# Patient Record
Sex: Female | Born: 1940 | Race: Black or African American | Hispanic: No | Smoking: Former smoker
Health system: Southern US, Community
[De-identification: ages and names within clinical notes are randomized; demographics above are authoritative.]

## PROBLEM LIST (undated history)

## (undated) ENCOUNTER — Emergency Department (HOSPITAL_COMMUNITY): Disposition: A | Discharge status: Other Acute Inpatient Hospital | Payer: Medicare Other

## (undated) DIAGNOSIS — I10 Essential (primary) hypertension: Secondary | ICD-10-CM

## (undated) DIAGNOSIS — E049 Nontoxic goiter, unspecified: Secondary | ICD-10-CM

## (undated) DIAGNOSIS — M797 Fibromyalgia: Secondary | ICD-10-CM

## (undated) DIAGNOSIS — G629 Polyneuropathy, unspecified: Secondary | ICD-10-CM

## (undated) DIAGNOSIS — H269 Unspecified cataract: Secondary | ICD-10-CM

## (undated) DIAGNOSIS — F17200 Nicotine dependence, unspecified, uncomplicated: Secondary | ICD-10-CM

## (undated) DIAGNOSIS — E785 Hyperlipidemia, unspecified: Secondary | ICD-10-CM

## (undated) HISTORY — PX: EYE SURGERY: SHX253

## (undated) HISTORY — DX: Hyperlipidemia, unspecified: E78.5

## (undated) HISTORY — PX: ABDOMINAL HYSTERECTOMY: SHX81

## (undated) HISTORY — DX: Unspecified cataract: H26.9

## (undated) HISTORY — DX: Nicotine dependence, unspecified, uncomplicated: F17.200

---

## 1998-05-18 ENCOUNTER — Encounter: Payer: Self-pay | Admitting: Family Medicine

## 1998-05-18 ENCOUNTER — Ambulatory Visit (HOSPITAL_COMMUNITY): Admission: RE | Admit: 1998-05-18 | Discharge: 1998-05-18 | Payer: Self-pay | Admitting: Family Medicine

## 1998-09-06 ENCOUNTER — Encounter: Payer: Self-pay | Admitting: Family Medicine

## 1998-09-06 ENCOUNTER — Ambulatory Visit (HOSPITAL_COMMUNITY): Admission: RE | Admit: 1998-09-06 | Discharge: 1998-09-06 | Payer: Self-pay | Admitting: Family Medicine

## 1999-05-07 ENCOUNTER — Encounter: Payer: Self-pay | Admitting: Family Medicine

## 1999-05-07 ENCOUNTER — Ambulatory Visit (HOSPITAL_COMMUNITY): Admission: RE | Admit: 1999-05-07 | Discharge: 1999-05-07 | Payer: Self-pay | Admitting: Family Medicine

## 1999-07-17 ENCOUNTER — Ambulatory Visit (HOSPITAL_COMMUNITY): Admission: RE | Admit: 1999-07-17 | Discharge: 1999-07-17 | Payer: Self-pay | Admitting: Family Medicine

## 1999-07-17 ENCOUNTER — Encounter: Payer: Self-pay | Admitting: Family Medicine

## 1999-09-14 ENCOUNTER — Encounter: Payer: Self-pay | Admitting: Specialist

## 1999-09-14 ENCOUNTER — Encounter: Admission: RE | Admit: 1999-09-14 | Discharge: 1999-09-14 | Payer: Self-pay | Admitting: Specialist

## 2000-04-08 ENCOUNTER — Emergency Department (HOSPITAL_COMMUNITY): Admission: EM | Admit: 2000-04-08 | Discharge: 2000-04-08 | Payer: Self-pay | Admitting: Emergency Medicine

## 2000-04-08 ENCOUNTER — Encounter: Payer: Self-pay | Admitting: Emergency Medicine

## 2000-08-16 ENCOUNTER — Ambulatory Visit (HOSPITAL_COMMUNITY): Admission: RE | Admit: 2000-08-16 | Discharge: 2000-08-16 | Payer: Self-pay | Admitting: Family Medicine

## 2000-08-16 ENCOUNTER — Encounter: Payer: Self-pay | Admitting: Family Medicine

## 2000-10-13 ENCOUNTER — Encounter: Payer: Self-pay | Admitting: Family Medicine

## 2000-10-13 ENCOUNTER — Ambulatory Visit (HOSPITAL_COMMUNITY): Admission: RE | Admit: 2000-10-13 | Discharge: 2000-10-13 | Payer: Self-pay | Admitting: Family Medicine

## 2000-12-25 ENCOUNTER — Encounter: Admission: RE | Admit: 2000-12-25 | Discharge: 2000-12-25 | Payer: Self-pay | Admitting: Family Medicine

## 2000-12-25 ENCOUNTER — Encounter: Payer: Self-pay | Admitting: Family Medicine

## 2001-03-12 ENCOUNTER — Encounter: Admission: RE | Admit: 2001-03-12 | Discharge: 2001-03-12 | Payer: Self-pay | Admitting: Family Medicine

## 2001-03-13 ENCOUNTER — Encounter: Admission: RE | Admit: 2001-03-13 | Discharge: 2001-03-13 | Payer: Self-pay | Admitting: Family Medicine

## 2001-03-13 ENCOUNTER — Encounter: Payer: Self-pay | Admitting: Family Medicine

## 2001-11-28 ENCOUNTER — Encounter: Payer: Self-pay | Admitting: Emergency Medicine

## 2001-11-28 ENCOUNTER — Emergency Department (HOSPITAL_COMMUNITY): Admission: EM | Admit: 2001-11-28 | Discharge: 2001-11-28 | Payer: Self-pay | Admitting: Emergency Medicine

## 2003-06-17 ENCOUNTER — Emergency Department (HOSPITAL_COMMUNITY): Admission: EM | Admit: 2003-06-17 | Discharge: 2003-06-17 | Payer: Self-pay | Admitting: Emergency Medicine

## 2005-04-26 ENCOUNTER — Ambulatory Visit (HOSPITAL_COMMUNITY): Admission: RE | Admit: 2005-04-26 | Discharge: 2005-04-26 | Payer: Self-pay | Admitting: Gastroenterology

## 2006-04-21 ENCOUNTER — Emergency Department (HOSPITAL_COMMUNITY): Admission: EM | Admit: 2006-04-21 | Discharge: 2006-04-21 | Payer: Self-pay | Admitting: Emergency Medicine

## 2006-06-05 ENCOUNTER — Encounter: Admission: RE | Admit: 2006-06-05 | Discharge: 2006-06-05 | Payer: Self-pay | Admitting: Orthopedic Surgery

## 2008-10-20 ENCOUNTER — Emergency Department (HOSPITAL_COMMUNITY): Admission: EM | Admit: 2008-10-20 | Discharge: 2008-10-20 | Payer: Self-pay | Admitting: Emergency Medicine

## 2008-11-09 ENCOUNTER — Encounter: Admission: RE | Admit: 2008-11-09 | Discharge: 2008-11-09 | Payer: Self-pay | Admitting: Family Medicine

## 2008-12-04 ENCOUNTER — Emergency Department (HOSPITAL_COMMUNITY): Admission: EM | Admit: 2008-12-04 | Discharge: 2008-12-04 | Payer: Self-pay | Admitting: Emergency Medicine

## 2008-12-11 ENCOUNTER — Emergency Department (HOSPITAL_COMMUNITY): Admission: EM | Admit: 2008-12-11 | Discharge: 2008-12-12 | Payer: Self-pay | Admitting: Emergency Medicine

## 2009-03-13 ENCOUNTER — Emergency Department (HOSPITAL_COMMUNITY): Admission: EM | Admit: 2009-03-13 | Discharge: 2009-03-13 | Payer: Self-pay | Admitting: Emergency Medicine

## 2009-03-15 ENCOUNTER — Emergency Department (HOSPITAL_COMMUNITY): Admission: EM | Admit: 2009-03-15 | Discharge: 2009-03-15 | Payer: Self-pay | Admitting: Emergency Medicine

## 2009-05-27 ENCOUNTER — Emergency Department (HOSPITAL_COMMUNITY): Admission: EM | Admit: 2009-05-27 | Discharge: 2009-05-27 | Payer: Self-pay | Admitting: Emergency Medicine

## 2009-09-12 ENCOUNTER — Emergency Department (HOSPITAL_COMMUNITY): Admission: EM | Admit: 2009-09-12 | Discharge: 2009-09-12 | Payer: Self-pay | Admitting: Emergency Medicine

## 2009-12-26 ENCOUNTER — Encounter: Admission: RE | Admit: 2009-12-26 | Discharge: 2010-01-11 | Payer: Self-pay | Admitting: Neurology

## 2010-03-01 ENCOUNTER — Emergency Department (HOSPITAL_COMMUNITY): Admission: EM | Admit: 2010-03-01 | Discharge: 2010-03-01 | Payer: Self-pay | Admitting: Emergency Medicine

## 2010-06-29 ENCOUNTER — Emergency Department (HOSPITAL_COMMUNITY)
Admission: EM | Admit: 2010-06-29 | Discharge: 2010-06-29 | Payer: Self-pay | Source: Home / Self Care | Admitting: Emergency Medicine

## 2010-09-10 LAB — URINE MICROSCOPIC-ADD ON

## 2010-09-10 LAB — POCT I-STAT, CHEM 8
BUN: 10 mg/dL (ref 6–23)
Chloride: 100 mEq/L (ref 96–112)
TCO2: 29 mmol/L (ref 0–100)

## 2010-09-10 LAB — URINALYSIS, ROUTINE W REFLEX MICROSCOPIC
Glucose, UA: NEGATIVE mg/dL
Hgb urine dipstick: NEGATIVE
Protein, ur: NEGATIVE mg/dL
pH: 6 (ref 5.0–8.0)

## 2010-09-13 LAB — GLUCOSE, CAPILLARY: Glucose-Capillary: 246 mg/dL — ABNORMAL HIGH (ref 70–99)

## 2010-09-24 LAB — CBC
MCV: 88.6 fL (ref 78.0–100.0)
WBC: 7.5 10*3/uL (ref 4.0–10.5)

## 2010-09-24 LAB — COMPREHENSIVE METABOLIC PANEL
ALT: 20 U/L (ref 0–35)
AST: 17 U/L (ref 0–37)
Alkaline Phosphatase: 115 U/L (ref 39–117)
CO2: 30 mEq/L (ref 19–32)
Chloride: 100 mEq/L (ref 96–112)
GFR calc Af Amer: >60 mL/min (ref >60)
Sodium: 140 mEq/L (ref 135–145)
Total Bilirubin: 0.4 mg/dL (ref 0.3–1.2)

## 2010-09-24 LAB — URINALYSIS, ROUTINE W REFLEX MICROSCOPIC
Bilirubin Urine: NEGATIVE
Glucose, UA: >1000 mg/dL — AB
Ketones, ur: NEGATIVE mg/dL
Leukocytes, UA: NEGATIVE
pH: 5 (ref 5.0–8.0)

## 2010-09-24 LAB — URINE CULTURE: Culture: NO GROWTH

## 2010-09-24 LAB — DIFFERENTIAL
Basophils Absolute: 0 10*3/uL (ref 0.0–0.1)
Eosinophils Absolute: 0.1 10*3/uL (ref 0.0–0.7)
Eosinophils Relative: 1 % (ref 0–5)
Neutrophils Relative %: 52 % (ref 43–77)

## 2010-09-24 LAB — URINE MICROSCOPIC-ADD ON

## 2010-10-03 LAB — URINALYSIS, ROUTINE W REFLEX MICROSCOPIC
Bilirubin Urine: NEGATIVE
Glucose, UA: >1000 mg/dL — AB
Hgb urine dipstick: NEGATIVE
Ketones, ur: NEGATIVE mg/dL
Leukocytes, UA: NEGATIVE
Nitrite: NEGATIVE
Protein, ur: NEGATIVE mg/dL
Specific Gravity, Urine: 1.036 — ABNORMAL HIGH (ref 1.005–1.030)
Urobilinogen, UA: 0.2 mg/dL (ref 0.0–1.0)
pH: 5 (ref 5.0–8.0)

## 2010-10-03 LAB — CBC
HCT: 41.1 % (ref 36.0–46.0)
Hemoglobin: 13.7 g/dL (ref 12.0–15.0)
MCHC: 33.4 g/dL (ref 30.0–36.0)
MCV: 88.7 fL (ref 78.0–100.0)
Platelets: 268 K/uL (ref 150–400)
RBC: 4.64 MIL/uL (ref 3.87–5.11)
RDW: 14.1 % (ref 11.5–15.5)
WBC: 7.7 10*3/uL (ref 4.0–10.5)

## 2010-10-03 LAB — DIFFERENTIAL
Basophils Absolute: 0 K/uL (ref 0.0–0.1)
Basophils Relative: 0 % (ref 0–1)
Eosinophils Absolute: 0.2 K/uL (ref 0.0–0.7)
Eosinophils Relative: 2 % (ref 0–5)
Lymphocytes Relative: 42 % (ref 12–46)
Lymphs Abs: 3.3 10*3/uL (ref 0.7–4.0)
Monocytes Absolute: 0.4 K/uL (ref 0.1–1.0)
Monocytes Relative: 5 % (ref 3–12)
Neutro Abs: 3.9 10*3/uL (ref 1.7–7.7)
Neutrophils Relative %: 50 % (ref 43–77)

## 2010-10-03 LAB — URINE MICROSCOPIC-ADD ON

## 2010-10-03 LAB — GLUCOSE, CAPILLARY
Glucose-Capillary: 256 mg/dL — ABNORMAL HIGH (ref 70–99)
Glucose-Capillary: 271 mg/dL — ABNORMAL HIGH (ref 70–99)

## 2010-10-03 LAB — COMPREHENSIVE METABOLIC PANEL
ALT: 25 U/L (ref 0–35)
Albumin: 4 g/dL (ref 3.5–5.2)
BUN: 18 mg/dL (ref 6–23)
Chloride: 95 mEq/L — ABNORMAL LOW (ref 96–112)
Creatinine, Ser: 0.98 mg/dL (ref 0.4–1.2)
GFR calc Af Amer: >60 mL/min (ref >60)
GFR calc non Af Amer: 56 mL/min — ABNORMAL LOW (ref >60)
Glucose, Bld: 401 mg/dL — ABNORMAL HIGH (ref 70–99)
Sodium: 132 mEq/L — ABNORMAL LOW (ref 135–145)

## 2010-10-03 LAB — COMPREHENSIVE METABOLIC PANEL WITH GFR
AST: 22 U/L (ref 0–37)
Alkaline Phosphatase: 127 U/L — ABNORMAL HIGH (ref 39–117)
CO2: 28 meq/L (ref 19–32)
Calcium: 9.4 mg/dL (ref 8.4–10.5)
Potassium: 3.4 meq/L — ABNORMAL LOW (ref 3.5–5.1)
Total Bilirubin: 0.6 mg/dL (ref 0.3–1.2)
Total Protein: 8.4 g/dL — ABNORMAL HIGH (ref 6.0–8.3)

## 2010-10-05 LAB — URINALYSIS, ROUTINE W REFLEX MICROSCOPIC
Bilirubin Urine: NEGATIVE
Glucose, UA: >1000 mg/dL — AB
Ketones, ur: NEGATIVE mg/dL
Leukocytes, UA: NEGATIVE
Nitrite: NEGATIVE
Protein, ur: NEGATIVE mg/dL
Specific Gravity, Urine: 1.03 (ref 1.005–1.030)
Specific Gravity, Urine: 1.031 — ABNORMAL HIGH (ref 1.005–1.030)
Urobilinogen, UA: 0.2 mg/dL (ref 0.0–1.0)
pH: 5 (ref 5.0–8.0)

## 2010-10-05 LAB — DIFFERENTIAL
Basophils Absolute: 0 10*3/uL (ref 0.0–0.1)
Basophils Absolute: 0.1 10*3/uL (ref 0.0–0.1)
Basophils Relative: 1 % (ref 0–1)
Eosinophils Absolute: 0.1 10*3/uL (ref 0.0–0.7)
Eosinophils Relative: 2 % (ref 0–5)
Eosinophils Relative: 3 % (ref 0–5)
Lymphocytes Relative: 25 % (ref 12–46)
Lymphs Abs: 3.1 10*3/uL (ref 0.7–4.0)
Monocytes Absolute: 0.3 10*3/uL (ref 0.1–1.0)

## 2010-10-05 LAB — CBC
HCT: 38.8 % (ref 36.0–46.0)
HCT: 39.3 % (ref 36.0–46.0)
Hemoglobin: 13.1 g/dL (ref 12.0–15.0)
MCV: 88.7 fL (ref 78.0–100.0)
Platelets: 258 10*3/uL (ref 150–400)
Platelets: 270 10*3/uL (ref 150–400)
RDW: 15.1 % (ref 11.5–15.5)
RDW: 15.1 % (ref 11.5–15.5)

## 2010-10-05 LAB — POCT I-STAT, CHEM 8
BUN: 16 mg/dL (ref 6–23)
BUN: 18 mg/dL (ref 6–23)
Chloride: 102 mEq/L (ref 96–112)
Glucose, Bld: 311 mg/dL — ABNORMAL HIGH (ref 70–99)
HCT: 42 % (ref 36.0–46.0)
HCT: 44 % (ref 36.0–46.0)
Hemoglobin: 14.3 g/dL (ref 12.0–15.0)
Potassium: 3.7 mEq/L (ref 3.5–5.1)
Sodium: 137 mEq/L (ref 135–145)
TCO2: 29 mmol/L (ref 0–100)

## 2010-10-05 LAB — GLUCOSE, CAPILLARY
Glucose-Capillary: 199 mg/dL — ABNORMAL HIGH (ref 70–99)
Glucose-Capillary: 272 mg/dL — ABNORMAL HIGH (ref 70–99)
Glucose-Capillary: 352 mg/dL — ABNORMAL HIGH (ref 70–99)
Glucose-Capillary: 427 mg/dL — ABNORMAL HIGH (ref 70–99)

## 2010-10-05 LAB — URINE MICROSCOPIC-ADD ON

## 2010-10-05 LAB — KETONES, QUALITATIVE: Acetone, Bld: NEGATIVE

## 2010-10-08 LAB — ETHANOL: Alcohol, Ethyl (B): <5 mg/dL (ref 0–10)

## 2010-10-08 LAB — BASIC METABOLIC PANEL
BUN: 17 mg/dL (ref 6–23)
Creatinine, Ser: 0.81 mg/dL (ref 0.4–1.2)
GFR calc non Af Amer: >60 mL/min (ref >60)

## 2010-10-08 LAB — URINALYSIS, ROUTINE W REFLEX MICROSCOPIC
Protein, ur: 30 mg/dL — AB
Urobilinogen, UA: 1 mg/dL (ref 0.0–1.0)

## 2010-10-08 LAB — URINE MICROSCOPIC-ADD ON

## 2010-10-08 LAB — POCT CARDIAC MARKERS: Myoglobin, poc: 68.8 ng/mL (ref 12–200)

## 2010-10-08 LAB — CBC
MCV: 86.8 fL (ref 78.0–100.0)
Platelets: 304 10*3/uL (ref 150–400)
WBC: 7.6 10*3/uL (ref 4.0–10.5)

## 2010-10-08 LAB — RAPID URINE DRUG SCREEN, HOSP PERFORMED
Amphetamines: NOT DETECTED
Benzodiazepines: NOT DETECTED
Cocaine: NOT DETECTED
Tetrahydrocannabinol: NOT DETECTED

## 2010-10-08 LAB — MAGNESIUM: Magnesium: 2 mg/dL (ref 1.5–2.5)

## 2010-10-10 LAB — URINALYSIS, ROUTINE W REFLEX MICROSCOPIC
Hgb urine dipstick: NEGATIVE
Ketones, ur: 15 mg/dL — AB
Leukocytes, UA: NEGATIVE
Protein, ur: 30 mg/dL — AB
Urobilinogen, UA: 1 mg/dL (ref 0.0–1.0)

## 2010-10-10 LAB — GLUCOSE, CAPILLARY
Glucose-Capillary: 257 mg/dL — ABNORMAL HIGH (ref 70–99)
Glucose-Capillary: 359 mg/dL — ABNORMAL HIGH (ref 70–99)

## 2010-10-10 LAB — POCT I-STAT, CHEM 8
BUN: 12 mg/dL (ref 6–23)
Calcium, Ion: 1.12 mmol/L (ref 1.12–1.32)
Chloride: 101 mEq/L (ref 96–112)
Potassium: 3.8 mEq/L (ref 3.5–5.1)

## 2010-10-10 LAB — POCT CARDIAC MARKERS
CKMB, poc: 1.4 ng/mL (ref 1.0–8.0)
Myoglobin, poc: 222 ng/mL (ref 12–200)
Troponin i, poc: <0.05 ng/mL (ref 0.00–0.09)

## 2010-10-10 LAB — URINE MICROSCOPIC-ADD ON

## 2011-11-14 ENCOUNTER — Encounter (HOSPITAL_COMMUNITY): Payer: Self-pay

## 2011-11-14 ENCOUNTER — Emergency Department (HOSPITAL_COMMUNITY)
Admission: EM | Admit: 2011-11-14 | Discharge: 2011-11-14 | Disposition: A | Discharge status: Home / Self Care | Payer: Medicare Other | Attending: Emergency Medicine | Admitting: Emergency Medicine

## 2011-11-14 DIAGNOSIS — R350 Frequency of micturition: Secondary | ICD-10-CM | POA: Insufficient documentation

## 2011-11-14 DIAGNOSIS — E119 Type 2 diabetes mellitus without complications: Secondary | ICD-10-CM | POA: Insufficient documentation

## 2011-11-14 DIAGNOSIS — F17200 Nicotine dependence, unspecified, uncomplicated: Secondary | ICD-10-CM | POA: Insufficient documentation

## 2011-11-14 DIAGNOSIS — Z91199 Patient's noncompliance with other medical treatment and regimen due to unspecified reason: Secondary | ICD-10-CM | POA: Insufficient documentation

## 2011-11-14 DIAGNOSIS — Z9119 Patient's noncompliance with other medical treatment and regimen: Secondary | ICD-10-CM | POA: Insufficient documentation

## 2011-11-14 DIAGNOSIS — R112 Nausea with vomiting, unspecified: Secondary | ICD-10-CM | POA: Insufficient documentation

## 2011-11-14 DIAGNOSIS — Z794 Long term (current) use of insulin: Secondary | ICD-10-CM | POA: Insufficient documentation

## 2011-11-14 DIAGNOSIS — I10 Essential (primary) hypertension: Secondary | ICD-10-CM | POA: Insufficient documentation

## 2011-11-14 DIAGNOSIS — R739 Hyperglycemia, unspecified: Secondary | ICD-10-CM

## 2011-11-14 HISTORY — DX: Essential (primary) hypertension: I10

## 2011-11-14 HISTORY — DX: Polyneuropathy, unspecified: G62.9

## 2011-11-14 LAB — DIFFERENTIAL
Basophils Absolute: 0 10*3/uL (ref 0.0–0.1)
Basophils Relative: 0 % (ref 0–1)
Eosinophils Absolute: 0.1 10*3/uL (ref 0.0–0.7)
Eosinophils Relative: 1 % (ref 0–5)
Monocytes Absolute: 0.4 10*3/uL (ref 0.1–1.0)

## 2011-11-14 LAB — COMPREHENSIVE METABOLIC PANEL
ALT: 13 U/L (ref 0–35)
AST: 13 U/L (ref 0–37)
Calcium: 9.3 mg/dL (ref 8.4–10.5)
Creatinine, Ser: 0.5 mg/dL (ref 0.50–1.10)
GFR calc non Af Amer: >90 mL/min (ref >90)
Sodium: 137 mEq/L (ref 135–145)
Total Protein: 8 g/dL (ref 6.0–8.3)

## 2011-11-14 LAB — CBC
HCT: 40 % (ref 36.0–46.0)
MCH: 28.2 pg (ref 26.0–34.0)
MCHC: 33.3 g/dL (ref 30.0–36.0)
MCV: 84.9 fL (ref 78.0–100.0)
Platelets: 274 10*3/uL (ref 150–400)
RDW: 14.4 % (ref 11.5–15.5)
WBC: 7.8 10*3/uL (ref 4.0–10.5)

## 2011-11-14 LAB — GLUCOSE, CAPILLARY
Glucose-Capillary: 391 mg/dL — ABNORMAL HIGH (ref 70–99)
Glucose-Capillary: 346 mg/dL — ABNORMAL HIGH (ref 70–99)
Glucose-Capillary: 240 mg/dL — ABNORMAL HIGH (ref 70–99)

## 2011-11-14 LAB — URINALYSIS, ROUTINE W REFLEX MICROSCOPIC
Bilirubin Urine: NEGATIVE
Hgb urine dipstick: NEGATIVE
Nitrite: NEGATIVE
Specific Gravity, Urine: 1.034 — ABNORMAL HIGH (ref 1.005–1.030)
Urobilinogen, UA: 1 mg/dL (ref 0.0–1.0)
pH: 6 (ref 5.0–8.0)

## 2011-11-14 LAB — URINE MICROSCOPIC-ADD ON

## 2011-11-14 MED ORDER — INSULIN ASPART 100 UNIT/ML ~~LOC~~ SOLN
10.0000 [IU] | Freq: Once | SUBCUTANEOUS | Status: AC
  Administered 2011-11-14: 10 [IU] via SUBCUTANEOUS
  Filled 2011-11-14: qty 1

## 2011-11-14 MED ORDER — ONDANSETRON HCL 8 MG PO TABS: 8.0000 mg | ORAL_TABLET | Freq: Three times a day (TID) | ORAL | Status: AC | PRN

## 2011-11-14 MED ORDER — ONDANSETRON HCL 4 MG/2ML IJ SOLN
4.0000 mg | Freq: Once | INTRAMUSCULAR | Status: AC
  Administered 2011-11-14: 4 mg via INTRAVENOUS
  Filled 2011-11-14: qty 2

## 2011-11-14 MED ORDER — SODIUM CHLORIDE 0.9 % IV BOLUS (SEPSIS)
1000.0000 mL | Freq: Once | INTRAVENOUS | Status: AC
  Administered 2011-11-14: 1000 mL via INTRAVENOUS

## 2011-11-14 NOTE — ED Provider Notes (Signed)
History     CSN: [id number]  Arrival date & time 11/14/11  0847   First MD Initiated Contact with Patient 11/14/11 [phone]      Chief Complaint  Patient presents with  . Emesis    (Consider location/radiation/quality/duration/timing/severity/associated sxs/prior treatment) HPI History provided by pt.   Pt has had nausea with 2 episodes of vomiting this morning.  Nausea currently improved.  Denies fever, CP/SOB, abd pain, diarrhea and hematemesis/hematochezia/melena.  Has had increased urinary frequency but otherwise no urinary sx. Has missed at least the last 2 doses of her lantus because she has been out of the house in the evening.  Checks her BG nearly every day and it has been "running high for awhile".   Also c/o walking sideways and feeling like she's going to fall for the past week.  Denies dizziness, vision changes, dysarthria, dysphagia, worse than baseline paresthesias (attributed to chronic neuropathy).    Past Medical History  Diagnosis Date  . Diabetes mellitus   . Hypertension   . Neuropathy     Past Surgical History  Procedure Date  . Eye surgery     No family history on file.  History  Substance Use Topics  . Smoking status: Current Everyday Smoker -- 0.5 packs/day  . Smokeless tobacco: Not on file  . Alcohol Use: No    OB History    Grav Para Term Preterm Abortions TAB SAB Ect Mult Living                  Review of Systems  All other systems reviewed and are negative.    Allergies  Review of patient's allergies indicates no known allergies.  Home Medications   Current Outpatient Rx  Name Route Sig Dispense Refill  . AMLODIPINE BESYLATE 10 MG PO TABS Oral Take 10 mg by mouth daily.    . INSULIN GLARGINE 100 UNIT/ML [place] SOLN Subcutaneous Inject 30 Units into the skin at bedtime.    Marland Kitchen METFORMIN HCL 500 MG PO TABS Oral Take 1,000 mg by mouth 2 (two) times daily with a meal.    . ROSUVASTATIN CALCIUM 5 MG PO TABS Oral Take 5 mg by mouth daily.    Marland Kitchen  VALSARTAN-HYDROCHLOROTHIAZIDE 160-25 MG PO TABS Oral Take 1 tablet by mouth daily.      BP 123/62  Pulse 67  Temp(Src) 98.1 F (36.7 C) (Oral)  Resp 15  Ht 5\' 6"  (1.676 m)  Wt 150 lb (68.04 kg)  BMI 24.21 kg/m2  SpO2 96%  Physical Exam  Nursing note and vitals reviewed. Constitutional: She is oriented to person, place, and time. She appears well-developed and well-nourished. No distress.  HENT:  Head: Normocephalic and atraumatic.  Eyes:       Normal appearance  Neck: Normal range of motion.  Cardiovascular: Normal rate, regular rhythm and intact distal pulses.   Pulmonary/Chest: Effort normal and breath sounds normal.  Abdominal: Soft. Bowel sounds are normal. She exhibits no distension. There is no tenderness.  Genitourinary:       No CVA ttp  Musculoskeletal: Normal range of motion.  Neurological: She is alert and oriented to person, place, and time. She displays no tremor. No sensory deficit. She displays a negative Romberg sign. Coordination and gait normal.       CN 3-12 intact.  5/5 and equal upper and lower extremity strength.  No past pointing.  No pronator drift.  No nystagmus.   Skin: Skin is warm and dry. No rash noted.  Psychiatric: She has a normal mood and affect. Her behavior is normal.    ED Course  Procedures (including critical care time)  Labs Reviewed  COMPREHENSIVE METABOLIC PANEL - Abnormal; Notable for the following:    Glucose, Bld 419 (*)    Alkaline Phosphatase 153 (*)    All other components within normal limits  URINALYSIS, ROUTINE W REFLEX MICROSCOPIC - Abnormal; Notable for the following:    Specific Gravity, Urine 1.034 (*)    Glucose, UA >1000 (*)    Ketones, ur 15 (*)    All other components within normal limits  GLUCOSE, CAPILLARY - Abnormal; Notable for the following:    Glucose-Capillary 391 (*)    All other components within normal limits  GLUCOSE, CAPILLARY - Abnormal; Notable for the following:    Glucose-Capillary 346 (*)     All other components within normal limits  GLUCOSE, CAPILLARY - Abnormal; Notable for the following:    Glucose-Capillary 240 (*)    All other components within normal limits  CBC  DIFFERENTIAL  URINE MICROSCOPIC-ADD ON   No results found.   1. Hyperglycemia   2. Nausea and vomiting       MDM  71yo F presents w/ N/V since this morning.  Associated w/ increased urinary frequency.  Non-compliant w/ insulin and cbg 391 this morning.  On exam, afebrile, abd benign and non-tender, no CVA ttp.  Also c/o walking sideways and feeling as though she's going to fall x 1 week.  No focal neuro deficits (nml gait and neg romberg) today and pt asymptomatic w/ ambulation.  Suspect that all sx are secondary to hyperglycemia.  Basic labs pending.  Pt receiving IV NS and zofran.  9:43 AM   After 2L bolus and 10units subq novolog, cbg has improved from 419 on BMP to 240.  Pt is not acidotic.  Her nausea has improved and she is tolerating pos.  D/c'd home w/ zofran.  Advised her to be compliant w/ her diabetes medication and f/u with Dr. Parke Simmers asap.  Return precautions discussed.         Otilio Miu, [place] 11/14/11 [phone]

## 2011-11-14 NOTE — ED Notes (Signed)
Pt presents with onset of vomiting today.  Pt reports abdominal pain that "feels like I have to go to the bathroom", has not had a bowel movement since yesterday. Pt reports over the past week, she has had tingling in both of her feet with difficulty walking due to weakness to L leg.  Pt reports weight loss of 20 pounds x 1 months.

## 2011-11-14 NOTE — Discharge Instructions (Signed)
Take zofran as needed for nausea.   Continue your diabetes medications as prescribed.  Follow up with your primary care doctor for blood sugar recheck next week.  You should return to the ER if you develop severe abdominal pain, uncontrolled vomiting or increased difficulty with walking.

## 2011-11-14 NOTE — ED Notes (Signed)
Pt undressed, in gown, on monitor, continuous pulse oximetry and blood pressure cuff 

## 2011-11-14 NOTE — ED Notes (Signed)
CBG: 391 

## 2011-11-14 NOTE — ED Notes (Signed)
Patient verbalized understanding drinking ice water slowly for oral challenge.

## 2011-11-14 NOTE — ED Notes (Signed)
Patient resting comfortable on stretcher given a warm blanket to patient denies nausea at this time.

## 2011-11-14 NOTE — ED Notes (Signed)
Patient ambulated with PA steady gait.

## 2011-11-14 NOTE — ED Notes (Signed)
Patient states have not been taking her insulin for 2-3 days. Has nausea and vomiting denies any abdominal pain, chest pain, or shortness of breath.  States have not been eating well for a while due to teeth falling out.  States history of neuropathy and tingling in lower extremities and for the past week "walking sideways" Ax4 answering and following commands appropriate.  Airway intact bilateral equal chest rise and fall.

## 2011-11-14 NOTE — ED Provider Notes (Signed)
Medical screening examination/treatment/procedure(s) were conducted as a shared visit with non-physician practitioner(s) and myself.  I personally evaluated the patient during the encounter  She complains of elevated glucose for 2 days. States she's taking her medicine, but has not eaten this morning. She denies abdominal pain. The abdomen is soft and nontender. At 10:38.  Evaluate for hypoglycemia, infection, and monitor hemodynamics state.    Flint Melter, MD 11/15/11 2027

## 2011-11-15 NOTE — ED Provider Notes (Deleted)
Medical screening examination/treatment/procedure(s) were performed by non-physician practitioner and as supervising physician I was immediately available for consultation/collaboration.  Flint Melter, MD 11/15/11 1315

## 2012-04-03 ENCOUNTER — Emergency Department (HOSPITAL_COMMUNITY): Payer: Medicare Other

## 2012-04-03 ENCOUNTER — Encounter (HOSPITAL_COMMUNITY): Payer: Self-pay | Admitting: *Deleted

## 2012-04-03 ENCOUNTER — Emergency Department (HOSPITAL_COMMUNITY)
Admission: EM | Admit: 2012-04-03 | Discharge: 2012-04-03 | Disposition: A | Discharge status: Home / Self Care | Payer: Medicare Other | Attending: Emergency Medicine | Admitting: Emergency Medicine

## 2012-04-03 DIAGNOSIS — M25562 Pain in left knee: Secondary | ICD-10-CM

## 2012-04-03 DIAGNOSIS — M797 Fibromyalgia: Secondary | ICD-10-CM | POA: Insufficient documentation

## 2012-04-03 DIAGNOSIS — E119 Type 2 diabetes mellitus without complications: Secondary | ICD-10-CM | POA: Insufficient documentation

## 2012-04-03 DIAGNOSIS — Z794 Long term (current) use of insulin: Secondary | ICD-10-CM | POA: Insufficient documentation

## 2012-04-03 DIAGNOSIS — M792 Neuralgia and neuritis, unspecified: Secondary | ICD-10-CM

## 2012-04-03 DIAGNOSIS — F17200 Nicotine dependence, unspecified, uncomplicated: Secondary | ICD-10-CM | POA: Insufficient documentation

## 2012-04-03 DIAGNOSIS — IMO0001 Reserved for inherently not codable concepts without codable children: Secondary | ICD-10-CM | POA: Insufficient documentation

## 2012-04-03 DIAGNOSIS — I10 Essential (primary) hypertension: Secondary | ICD-10-CM | POA: Insufficient documentation

## 2012-04-03 DIAGNOSIS — M1710 Unilateral primary osteoarthritis, unspecified knee: Secondary | ICD-10-CM | POA: Insufficient documentation

## 2012-04-03 DIAGNOSIS — G5790 Unspecified mononeuropathy of unspecified lower limb: Secondary | ICD-10-CM | POA: Insufficient documentation

## 2012-04-03 HISTORY — DX: Fibromyalgia: M79.7

## 2012-04-03 MED ORDER — OXYCODONE-ACETAMINOPHEN 5-325 MG PO TABS
2.0000 | ORAL_TABLET | Freq: Once | ORAL | Status: AC
  Administered 2012-04-03: 2 via ORAL
  Filled 2012-04-03: qty 2

## 2012-04-03 MED ORDER — OXYCODONE-ACETAMINOPHEN 5-325 MG PO TABS: 2.0000 | ORAL_TABLET | Freq: Once | ORAL | Status: DC

## 2012-04-03 NOTE — ED Notes (Signed)
Pt. Has c/o bilateral leg pain that has been going on for one week. Pt. Has been taking Tylenol and Motrin at home but it is not getting better.  Pt. Has swelling to the left knee.  Pt. 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

## 2012-04-03 NOTE — ED Provider Notes (Signed)
History     CSN: [id number]  Arrival date & time 04/03/12  7829   First MD Initiated Contact with Patient 04/03/12 [phone]      Chief Complaint  Patient presents with  . Leg Pain  . Generalized Body Aches  . Fibromyalgia    (Consider location/radiation/quality/duration/timing/severity/associated sxs/prior treatment) HPI CC: Neuropathic pain and knee pain  Pt w/ chronic LE neuropathic pain that acutely worsened 5 days ago. Primarily in feet but also w/ painful L knee. Improves w/ ibuprofen 400mg  and tylenol. No aggrevating factors. Started on Lyrica by PCP 5 days ago. BS at home ranges from 200-300s. DM for 12 yrs. Denies recent fever, n/v/d/c, rash. Pain is both sharp and feels like pins-and-needles   Past Medical History  Diagnosis Date  . Diabetes mellitus   . Hypertension   . Neuropathy   . Fibromyalgia     Past Surgical History  Procedure Date  . Eye surgery     History reviewed. No pertinent family history.  History  Substance Use Topics  . Smoking status: Current Every Day Smoker -- 0.5 packs/day  . Smokeless tobacco: Not on file  . Alcohol Use: No    OB History    Grav Para Term Preterm Abortions TAB SAB Ect Mult Living                  Review of Systems pe rhpi Allergies  Review of patient's allergies indicates no known allergies.  Home Medications   Current Outpatient Rx  Name Route Sig Dispense Refill  . AMLODIPINE BESYLATE 10 MG PO TABS Oral Take 10 mg by mouth daily.    . ASPIRIN 325 MG PO TABS Oral Take 325 mg by mouth daily.    . IBUPROFEN 200 MG PO TABS Oral Take 400 mg by mouth every 6 (six) hours as needed. For pain    . INSULIN ASPART 100 UNIT/ML [place] SOLN Subcutaneous Inject 8-25 Units into the skin 3 (three) times daily before meals.    . INSULIN GLARGINE 100 UNIT/ML [place] SOLN Subcutaneous Inject 30 Units into the skin at bedtime.    Marland Kitchen PREGABALIN 50 MG PO CAPS Oral Take 50 mg by mouth 2 (two) times daily.    Marland Kitchen SITAGLIPTIN PHOSPHATE 100 MG  PO TABS Oral Take 100 mg by mouth daily.    Marland Kitchen VALSARTAN-HYDROCHLOROTHIAZIDE 160-12.5 MG PO TABS Oral Take 1 tablet by mouth daily.      BP 142/62  Pulse 65  Temp 98.2 F (36.8 C) (Oral)  Resp 18  SpO2 98%  Physical Exam  Constitutional: She is oriented to person, place, and time. She appears well-developed and well-nourished.  HENT:  Head: Normocephalic.  Eyes: EOM are normal. Pupils are equal, round, and reactive to light.  Neck: Normal range of motion.  Cardiovascular: Normal rate and regular rhythm.   Pulmonary/Chest: Effort normal and breath sounds normal.  Abdominal: Soft. She exhibits no distension.  Musculoskeletal: She exhibits no edema.       Possible mild effusion of L knee, normal ROM. LE w/o edema bilat. 2+ dorsalis pedis pulses.  Neurological: She is alert and oriented to person, place, and time. No cranial nerve deficit.       Sensation of pins and needles of toes and balls of feet   Skin: Skin is warm and dry. No erythema.  Psychiatric: She has a normal mood and affect. Her behavior is normal.    ED Course  Procedures (including critical care time)  Labs Reviewed -  No data to display Dg Knee Complete 4 Views Left  04/03/2012  *RADIOLOGY REPORT*  Clinical Data: Knee pain  LEFT KNEE - COMPLETE 4+ VIEW  Comparison:  films 06/05/2006  Findings: There is narrowing of the medial compartment.  There is spurring of the patellofemoral compartment.  No joint effusion.  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   Original Report Authenticated By: Genevive Bi, M.D.      No diagnosis found.    MDM  71yo female w/ acute flare in neuropathic pain. Relief in ED w/ percocet. No evidence of Acute knee injury/abnormality - Rx for Percocet x 20 - f/u w/ PCP at scheduled appt on 11th - Recomment Ortho f/u for knee pain if persists.  Shelly Flatten, MD Family Medicine PGY-2 04/03/2012, 11:16  AM          Ozella Rocks, MD 04/03/12 1118  Ozella Rocks, MD 04/17/12 [phone]

## 2012-04-18 NOTE — ED Provider Notes (Signed)
I have personally seen and examined the patient.  I have discussed the plan of care with the resident.  I have reviewed the documentation on PMH/FH/Soc. History.  I have reviewed the documentation of the resident and agree.   Joya Gaskins, MD 04/18/12 [phone]

## 2012-05-23 ENCOUNTER — Emergency Department (HOSPITAL_COMMUNITY)
Admission: EM | Admit: 2012-05-23 | Discharge: 2012-05-23 | Disposition: A | Discharge status: Home / Self Care | Payer: Medicare Other | Attending: Emergency Medicine | Admitting: Emergency Medicine

## 2012-05-23 ENCOUNTER — Encounter (HOSPITAL_COMMUNITY): Payer: Self-pay | Admitting: Emergency Medicine

## 2012-05-23 DIAGNOSIS — Z794 Long term (current) use of insulin: Secondary | ICD-10-CM | POA: Insufficient documentation

## 2012-05-23 DIAGNOSIS — IMO0001 Reserved for inherently not codable concepts without codable children: Secondary | ICD-10-CM | POA: Insufficient documentation

## 2012-05-23 DIAGNOSIS — E1149 Type 2 diabetes mellitus with other diabetic neurological complication: Secondary | ICD-10-CM | POA: Insufficient documentation

## 2012-05-23 DIAGNOSIS — Z79899 Other long term (current) drug therapy: Secondary | ICD-10-CM | POA: Insufficient documentation

## 2012-05-23 DIAGNOSIS — G589 Mononeuropathy, unspecified: Secondary | ICD-10-CM | POA: Insufficient documentation

## 2012-05-23 DIAGNOSIS — F17200 Nicotine dependence, unspecified, uncomplicated: Secondary | ICD-10-CM | POA: Insufficient documentation

## 2012-05-23 DIAGNOSIS — M797 Fibromyalgia: Secondary | ICD-10-CM

## 2012-05-23 DIAGNOSIS — I10 Essential (primary) hypertension: Secondary | ICD-10-CM | POA: Insufficient documentation

## 2012-05-23 NOTE — ED Provider Notes (Signed)
History     CSN: [id number]  Arrival date & time 05/23/12  1058   First MD Initiated Contact with Patient 05/23/12 1112      Chief Complaint  Patient presents with  . Pain    (Consider location/radiation/quality/duration/timing/severity/associated sxs/prior treatment) HPI Comments: This is a 71 year old female, who presents to the emergency department with a chief complaint of pain all over. The patient has past medical history remarkable for fibromyalgia and neuropathy of the lower extremities. She says that most of her pain is in her left thigh, but that her pain is diffuse in general. She also complains of constipation for the past 2 days. Patient states that she has pain and burning all over her body for the past 2 months. She has been seen by her primary care doctor, who prescribed her with hydrocodone. She states that her primary care was unwilling to represcribed oxycodone which the patient received in the ER at her last visit. She states that she gets relief with that hydrocodone for an hour or 2, and then her pain returns. There are no aggravating or relieving factors. She denies headache, chest pain, shortness of breath, nausea, vomiting, diarrhea, dysuria, numbness and tingling of the extremities.  The history is provided by the patient. No language interpreter was used.    Past Medical History  Diagnosis Date  . Diabetes mellitus   . Hypertension   . Neuropathy   . Fibromyalgia     Past Surgical History  Procedure Date  . Eye surgery   . Abdominal hysterectomy     No family history on file.  History  Substance Use Topics  . Smoking status: Current Every Day Smoker -- 0.5 packs/day  . Smokeless tobacco: Not on file  . Alcohol Use: No    OB History    Grav Para Term Preterm Abortions TAB SAB Ect Mult Living                  Review of Systems  All other systems reviewed and are negative.    Allergies  Review of patient's allergies indicates no known  allergies.  Home Medications   Current Outpatient Rx  Name  Route  Sig  Dispense  Refill  . AMLODIPINE BESYLATE 10 MG PO TABS   Oral   Take 10 mg by mouth daily.         Marland Kitchen HYDROCODONE-ACETAMINOPHEN 7.5-500 MG PO TABS   Oral   Take 1 tablet by mouth every 12 (twelve) hours.         . IBUPROFEN 200 MG PO TABS   Oral   Take 400 mg by mouth every 6 (six) hours as needed. For pain         . INSULIN GLARGINE 100 UNIT/ML [place] SOLN   Subcutaneous   Inject 30 Units into the skin at bedtime.         Marland Kitchen LIRAGLUTIDE 18 MG/3ML [place] SOLN   Subcutaneous   Inject 1.8 mg into the skin daily.         . OXYCODONE-ACETAMINOPHEN 5-325 MG PO TABS   Oral   Take 2 tablets by mouth daily as needed. For pain         . PREGABALIN 50 MG PO CAPS   Oral   Take 50 mg by mouth 2 (two) times daily.         Marland Kitchen SITAGLIPTIN PHOSPHATE 100 MG PO TABS   Oral   Take 100 mg by mouth daily.         Marland Kitchen  VALSARTAN-HYDROCHLOROTHIAZIDE 160-12.5 MG PO TABS   Oral   Take 1 tablet by mouth daily.         . INSULIN ASPART 100 UNIT/ML [place] SOLN   Subcutaneous   Inject 8-25 Units into the skin 3 (three) times daily before meals.           BP 137/72  Pulse 88  Temp 98.4 F (36.9 C) (Oral)  Resp 20  SpO2 100%  Physical Exam  Nursing note and vitals reviewed. Constitutional: She is oriented to person, place, and time. She appears well-developed and well-nourished.  HENT:  Head: Normocephalic and atraumatic.  Eyes: Conjunctivae normal and EOM are normal. Pupils are equal, round, and reactive to light.  Neck: Normal range of motion. Neck supple.  Cardiovascular: Normal rate and regular rhythm.  Exam reveals no gallop and no friction rub.   No murmur heard. Pulmonary/Chest: Effort normal and breath sounds normal. No respiratory distress. She has no wheezes. She has no rales. She exhibits no tenderness.  Abdominal: Soft. Bowel sounds are normal. She exhibits no distension and no mass. There is no  tenderness. There is no rebound and no guarding.  Musculoskeletal: Normal range of motion. She exhibits no edema and no tenderness.       Extremities are nonpainful to palpation.  Neurological: She is alert and oriented to person, place, and time.  Skin: Skin is warm and dry.  Psychiatric: She has a normal mood and affect. Her behavior is normal. Judgment and thought content normal.    ED Course  Procedures (including critical care time)  Labs Reviewed - No data to display No results found.   1. Fibromyalgia       MDM  71 year old female with diffuse pain, fibromyalgia, and lower extremity neuropathy. This patient has been discussed with Dr. Ranae Palms, who has also seen the patient.  I am going to discharge the patient to home with primary care followup. She needs to see a chronic pain specialist. I discussed this with her. She is to followup with primary care and get chronic pain referral. Patient is agreeable and understands the plan. She is stable and ready for discharge. She is to continue taking her regular medications.        Roxy Horseman, PA-C 05/23/12 1303

## 2012-05-23 NOTE — ED Notes (Signed)
Pt reports, "My skin is burning." Pt c/o generalized burning feeling all over her body ongoing for over a month almost 2 months. Pt reports, "i have neuropathy." Pt seen by PMD for same and given medication but only gives relief for about an hour.

## 2012-05-24 NOTE — ED Provider Notes (Signed)
Medical screening examination/treatment/procedure(s) were conducted as a shared visit with non-physician practitioner(s) and myself.  I personally evaluated the patient during the encounter   Loren Racer, MD 05/24/12 [phone]

## 2012-06-10 ENCOUNTER — Other Ambulatory Visit: Payer: Self-pay | Admitting: Internal Medicine

## 2012-06-10 DIAGNOSIS — Z1231 Encounter for screening mammogram for malignant neoplasm of breast: Secondary | ICD-10-CM

## 2012-07-22 ENCOUNTER — Ambulatory Visit: Payer: Medicare Other

## 2012-12-03 ENCOUNTER — Ambulatory Visit (INDEPENDENT_AMBULATORY_CARE_PROVIDER_SITE_OTHER): Payer: Medicare Other | Admitting: Neurology

## 2012-12-03 ENCOUNTER — Encounter: Payer: Self-pay | Admitting: Neurology

## 2012-12-03 VITALS — BP 138/71 | HR 73 | Ht 67.0 in | Wt 160.0 lb

## 2012-12-03 DIAGNOSIS — G629 Polyneuropathy, unspecified: Secondary | ICD-10-CM

## 2012-12-03 DIAGNOSIS — E119 Type 2 diabetes mellitus without complications: Secondary | ICD-10-CM | POA: Insufficient documentation

## 2012-12-03 DIAGNOSIS — G609 Hereditary and idiopathic neuropathy, unspecified: Secondary | ICD-10-CM

## 2012-12-03 MED ORDER — NORTRIPTYLINE HCL 25 MG PO CAPS: 25.0000 mg | ORAL_CAPSULE | Freq: Every day | ORAL | Status: DC

## 2012-12-03 NOTE — Progress Notes (Signed)
History of Present Illness  Diane Knight is a 72 year old right-handed African American female,   She has past medical history of insulin-dependent diabetes for more than 10 years, poorly controlled there was documented A1c of 14 in March 2011. She also has past medical history of hypertension.  I saw her in June 2011 for two-month history of bilateral feet paresthesia, left foot drop, she was not able to tolerate nerve conduction study, I was only able to needle her left tibialis anterior, peroneal longus muscles, which has demonstrate chronic neuropathic change at that time MRI-lumbar  showed L1-2, L2-3, L3-4: There is no spinal stenosis or foraminal narrowing.  L4-5: Disc bulging, facet arthropathy, resulting in moderate spinal stenosis and no foraminal narrowing. L5-S1: Disc bulging, facet arthropathy, resulting in no spinal stenosis or foraminal narrowing.  She has mild midline low back pain, her gait overall have improved. she came back today, complains of chronic bilateral feet paresthesia, hypersensitivity, recent few months, also involving bilateral hands, upper extremity,  She is a poor historian, also could not tolerate a normal neurological examination  She was given a prescription of Lyrica, 50 mg twice a day without much help, recently increased to 200 mg twice a day, which has made her sleepy,  Review of Systems  Out of a complete 14 system review, the patient complains of only the following symptoms, and all other reviewed systems are negative.   Constitutional:   Fatigue Cardiovascular:  N/A Ear/Nose/Throat:  N/A Skin: N/A Eyes: Blurred vision Respiratory: N/A Gastroitestinal: N/A    Hematology/Lymphatic:  N/A Endocrine:  Feeling hot Musculoskeletal:N/A Allergy/Immunology: N/A Neurological: Sleepiness, restless legs  Psychiatric:    N/A  PHYSICAL EXAMINATOINS:  Generalized: In no acute distress  Neck: Supple, no carotid bruits   Cardiac: Regular rate  rhythm  Pulmonary: Clear to auscultation bilaterally  Musculoskeletal: No deformity  Neurological examination  Mentation: Alert oriented to time, place, history taking, and causual conversation, depressed looking middle aged female.  Cranial nerve II-XII: Pupils were equal round reactive to light extraocular movements were full, visual field were full on confrontational test. facial sensation and strength were normal. hearing was intact to finger rubbing bilaterally. Uvula tongue midline.  head turning and shoulder shrug and were normal and symmetric.Tongue protrusion into cheek strength was normal.  Motor: normal tone, bulk and strength.  Sensory: could not tolerate   Coordination: Normal finger to nose, heel-to-shin bilaterally there was no truncal ataxia  Gait: atalgic, cautious, she was able to stand on heels or tiptoe  Romberg signs: Negative  Deep tendon reflexes: Brachioradialis 2/2, biceps 2/2, triceps 2/2, patellar 2/2, Achilles trace, plantar responses were flexor bilaterally.  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  RTC in 3 months with Eber Jones

## 2012-12-07 ENCOUNTER — Telehealth: Payer: Self-pay | Admitting: Neurology

## 2013-06-15 ENCOUNTER — Other Ambulatory Visit: Payer: Self-pay | Admitting: Internal Medicine

## 2013-06-15 ENCOUNTER — Ambulatory Visit
Admission: RE | Admit: 2013-06-15 | Discharge: 2013-06-15 | Disposition: A | Discharge status: Home / Self Care | Payer: Medicare Other | Source: Ambulatory Visit | Attending: Internal Medicine | Admitting: Internal Medicine

## 2013-06-15 DIAGNOSIS — M25562 Pain in left knee: Secondary | ICD-10-CM

## 2013-08-31 ENCOUNTER — Other Ambulatory Visit (HOSPITAL_COMMUNITY): Payer: Self-pay | Admitting: Internal Medicine

## 2013-08-31 DIAGNOSIS — Z1231 Encounter for screening mammogram for malignant neoplasm of breast: Secondary | ICD-10-CM

## 2013-09-09 ENCOUNTER — Ambulatory Visit (HOSPITAL_COMMUNITY)
Admission: RE | Admit: 2013-09-09 | Discharge: 2013-09-09 | Disposition: A | Discharge status: Home / Self Care | Payer: Medicare Other | Source: Ambulatory Visit | Attending: Internal Medicine | Admitting: Internal Medicine

## 2013-09-09 DIAGNOSIS — Z1231 Encounter for screening mammogram for malignant neoplasm of breast: Secondary | ICD-10-CM | POA: Insufficient documentation

## 2013-09-10 ENCOUNTER — Other Ambulatory Visit: Payer: Self-pay | Admitting: Internal Medicine

## 2013-09-10 DIAGNOSIS — R928 Other abnormal and inconclusive findings on diagnostic imaging of breast: Secondary | ICD-10-CM

## 2013-09-22 ENCOUNTER — Ambulatory Visit
Admission: RE | Admit: 2013-09-22 | Discharge: 2013-09-22 | Disposition: A | Discharge status: Home / Self Care | Payer: Medicare Other | Source: Ambulatory Visit | Attending: Internal Medicine | Admitting: Internal Medicine

## 2013-09-22 DIAGNOSIS — R928 Other abnormal and inconclusive findings on diagnostic imaging of breast: Secondary | ICD-10-CM

## 2013-10-14 ENCOUNTER — Other Ambulatory Visit: Payer: Self-pay | Admitting: Internal Medicine

## 2013-10-14 DIAGNOSIS — E1339 Other specified diabetes mellitus with other diabetic ophthalmic complication: Secondary | ICD-10-CM

## 2013-10-25 ENCOUNTER — Emergency Department (HOSPITAL_COMMUNITY)
Admission: EM | Admit: 2013-10-25 | Discharge: 2013-10-25 | Disposition: A | Discharge status: Home / Self Care | Payer: Medicare Other | Attending: Emergency Medicine | Admitting: Emergency Medicine

## 2013-10-25 ENCOUNTER — Emergency Department (HOSPITAL_COMMUNITY): Payer: Medicare Other

## 2013-10-25 ENCOUNTER — Encounter (HOSPITAL_COMMUNITY): Payer: Self-pay | Admitting: Emergency Medicine

## 2013-10-25 DIAGNOSIS — IMO0001 Reserved for inherently not codable concepts without codable children: Secondary | ICD-10-CM | POA: Insufficient documentation

## 2013-10-25 DIAGNOSIS — M25552 Pain in left hip: Secondary | ICD-10-CM

## 2013-10-25 DIAGNOSIS — M62838 Other muscle spasm: Secondary | ICD-10-CM

## 2013-10-25 DIAGNOSIS — E119 Type 2 diabetes mellitus without complications: Secondary | ICD-10-CM | POA: Insufficient documentation

## 2013-10-25 DIAGNOSIS — G589 Mononeuropathy, unspecified: Secondary | ICD-10-CM | POA: Insufficient documentation

## 2013-10-25 DIAGNOSIS — I10 Essential (primary) hypertension: Secondary | ICD-10-CM | POA: Insufficient documentation

## 2013-10-25 DIAGNOSIS — Z794 Long term (current) use of insulin: Secondary | ICD-10-CM | POA: Insufficient documentation

## 2013-10-25 DIAGNOSIS — F17200 Nicotine dependence, unspecified, uncomplicated: Secondary | ICD-10-CM | POA: Insufficient documentation

## 2013-10-25 DIAGNOSIS — Z79899 Other long term (current) drug therapy: Secondary | ICD-10-CM | POA: Insufficient documentation

## 2013-10-25 DIAGNOSIS — M25559 Pain in unspecified hip: Secondary | ICD-10-CM | POA: Insufficient documentation

## 2013-10-25 MED ORDER — METHOCARBAMOL 500 MG PO TABS: 500.0000 mg | ORAL_TABLET | Freq: Two times a day (BID) | ORAL | Status: DC

## 2013-10-25 MED ORDER — METHOCARBAMOL 500 MG PO TABS
500.0000 mg | ORAL_TABLET | Freq: Once | ORAL | Status: AC
  Administered 2013-10-25: 500 mg via ORAL
  Filled 2013-10-25: qty 1

## 2013-10-25 NOTE — ED Notes (Signed)
Patient transported to X-ray 

## 2013-10-25 NOTE — ED Provider Notes (Signed)
CSN: [id number]     Arrival date & time 10/25/13  0554 History   First MD Initiated Contact with Patient 10/25/13 [phone]     Chief Complaint  Patient presents with  . Hip Pain     (Consider location/radiation/quality/duration/timing/severity/associated sxs/prior Treatment) HPI Comments: Patient is a 73 yo F PMHx significant for DM, HTN, Neuropathy, Fibromyalgia presenting to the ED for three days of left sided left pain. Patient describes the pain as a tight spasm to the lateral portion of her left leg running from her hip to her knee. She denies any falls or trauma or acute injury. Patient has tried her hydrocodone with some improvement of her symptoms. Palpation and stretching aggravate her pain. No fevers, chills, nausea, vomiting, bladder or bowel incontinence, numbness or tingling.   Patient is a 73 y.o. female presenting with hip pain.  Hip Pain Associated symptoms include myalgias. Pertinent negatives include no chills, fever, nausea, neck pain or vomiting.    Past Medical History  Diagnosis Date  . Diabetes mellitus   . Hypertension   . Neuropathy   . Fibromyalgia    Past Surgical History  Procedure Laterality Date  . Eye surgery    . Abdominal hysterectomy     Family History  Problem Relation Age of Onset  . Diabetes Mother   . High blood pressure Father    History  Substance Use Topics  . Smoking status: Current Every Day Smoker -- 0.30 packs/day    Types: Cigarettes  . Smokeless tobacco: Never Used  . Alcohol Use: No   OB History   Grav Para Term Preterm Abortions TAB SAB Ect Mult Living                 Review of Systems  Constitutional: Negative for fever and chills.  Gastrointestinal: Negative for nausea and vomiting.  Musculoskeletal: Positive for myalgias. Negative for back pain and neck pain.  All other systems reviewed and are negative.     Allergies  Review of patient's allergies indicates no known allergies.  Home Medications   Prior to  Admission medications   Medication Sig Start Date End Date Taking? Authorizing Provider  amLODipine (NORVASC) 10 MG tablet Take 10 mg by mouth daily.    Historical Provider, MD  HYDROcodone-acetaminophen (LORTAB) 7.5-500 MG per tablet Take 1 tablet by mouth every 12 (twelve) hours.    Historical Provider, MD  ibuprofen (ADVIL,MOTRIN) 200 MG tablet Take 400 mg by mouth every 6 (six) hours as needed. For pain    Historical Provider, MD  insulin aspart (NOVOLOG) 100 UNIT/ML injection Inject 8-25 Units into the skin 3 (three) times daily before meals.    Historical Provider, MD  insulin glargine (LANTUS) 100 UNIT/ML injection Inject 30 Units into the skin at bedtime.    Historical Provider, MD  Liraglutide (VICTOZA) 18 MG/3ML SOLN Inject 1.8 mg into the skin daily.    Historical Provider, MD  nortriptyline (PAMELOR) 25 MG capsule Take 1 capsule (25 mg total) by mouth at bedtime. 12/03/12   Levert FeinsteinYijun Yan, MD  oxyCODONE-acetaminophen (PERCOCET/ROXICET) 5-325 MG per tablet Take 2 tablets by mouth daily as needed. For pain 04/03/12   Ozella Rocksavid J Merrell, MD  pregabalin (LYRICA) 50 MG capsule Take 50 mg by mouth 2 (two) times daily.    Historical Provider, MD  sitaGLIPtin (JANUVIA) 100 MG tablet Take 100 mg by mouth daily.    Historical Provider, MD  valsartan-hydrochlorothiazide (DIOVAN-HCT) 160-12.5 MG per tablet Take 1 tablet by mouth daily.  Historical Provider, MD   BP 143/66  Pulse 67  Temp(Src) 98.6 F (37 C)  Resp 18  SpO2 99% Physical Exam  Nursing note and vitals reviewed. Constitutional: She is oriented to person, place, and time. She appears well-developed and well-nourished. No distress.  HENT:  Head: Normocephalic and atraumatic.  Right Ear: External ear normal.  Left Ear: External ear normal.  Nose: Nose normal.  Mouth/Throat: Oropharynx is clear and moist.  Eyes: Conjunctivae are normal.  Neck: Normal range of motion. Neck supple.  Cardiovascular: Normal rate, regular rhythm, normal  heart sounds and intact distal pulses.   Pulmonary/Chest: Effort normal and breath sounds normal. No respiratory distress.  Abdominal: Soft. There is no tenderness.  Musculoskeletal: Normal range of motion.       Right hip: Normal.       Left hip: She exhibits tenderness (spasm). She exhibits normal range of motion, normal strength, no bony tenderness, no swelling and no deformity.       Right knee: Normal.       Left knee: Normal.       Lumbar back: Normal.       Right upper leg: Normal.       Left upper leg: She exhibits tenderness. She exhibits no bony tenderness, no swelling, no edema, no deformity and no laceration.       Right lower leg: Normal.       Left lower leg: Normal.       Legs: Neurological: She is alert and oriented to person, place, and time.  Skin: Skin is warm and dry. She is not diaphoretic.  Psychiatric: She has a normal mood and affect.    ED Course  Procedures (including critical care time) Medications  methocarbamol (ROBAXIN) tablet 500 mg (500 mg Oral Given 10/25/13 0645)    Labs Review Labs Reviewed - No data to display  Imaging Review Dg Hip Complete Left  10/25/2013   CLINICAL DATA:  Hip pain.  EXAM: LEFT HIP - COMPLETE 2+ VIEW  COMPARISON:  None.  FINDINGS: Femoral heads are well formed and located. Hip joint spaces are intact with mild degenerative change bilaterally, right hip superolateral acetabular spurring. . Sacroiliac joints are symmetric.  No destructive bony lesions. Included soft tissue planes are non-suspicious. Phleboliths project in the pelvis. Subcentimeter calculus projects in right proximal femur could reflect phlebolith.  IMPRESSION: No acute fracture deformity or dislocation.   Electronically Signed   By: Awilda Metroourtnay  Bloomer   On: 10/25/2013 06:36     EKG Interpretation None      MDM   Final diagnoses:  Left hip pain  Leg muscle spasm    Filed Vitals:   10/25/13 0700  BP: 143/66  Pulse: 67  Temp:   Resp:    Afebrile,  NAD, non-toxic appearing, AAOx4. Neurovascularly intact. Normal sensation. Left hip ROM intact. No erythema or warmth over the joint. IT band is TTP and in spasm. Will treat with muscle relaxants and RICE method. X-ray negative for acute bony finding of hip, low suspicion clinically for bony pathology given history and PE findings as well. Return precautions discussed. Patient is agreeable to plan. Patient is stable at time of discharge. Patient d/w with Dr. Nicanor AlconPalumbo, agrees with plan.         Jeannetta EllisJennifer L Anquinette Pierro, PA-C 10/25/13 [phone]

## 2013-10-25 NOTE — ED Notes (Signed)
Pt c/o left hip and leg pain started left 3 days ago; hx of neuropathy; denies fall or trauma; pain is progressively getting worse; pt stated 10/10; Pt from home

## 2013-10-25 NOTE — ED Provider Notes (Signed)
Medical screening examination/treatment/procedure(s) were performed by non-physician practitioner and as supervising physician I was immediately available for consultation/collaboration.   EKG Interpretation None       Diane Knight K Jameya Pontiff-Rasch, MD 10/25/13 2349 

## 2013-10-25 NOTE — Discharge Instructions (Signed)
Please follow up with your primary care physician in 1-2 days. If you do not have one please call the [facility] and wellness Center number listed above. Please use Robaxin with caution to help with muscle spasm. Please do not take this concurrently with your Hydrocodone. Please use RICE method below. Please read all discharge instructions and return precautions.   Muscle Cramps and Spasms Muscle cramps and spasms occur when a muscle or muscles tighten and you have no control over this tightening (involuntary muscle contraction). They are a common problem and can develop in any muscle. The most common place is in the calf muscles of the leg. Both muscle cramps and muscle spasms are involuntary muscle contractions, but they also have differences:   Muscle cramps are sporadic and painful. They may last a few seconds to a quarter of an hour. Muscle cramps are often more forceful and last longer than muscle spasms.  Muscle spasms may or may not be painful. They may also last just a few seconds or much longer. CAUSES  It is uncommon for cramps or spasms to be due to a serious underlying problem. In many cases, the cause of cramps or spasms is unknown. Some common causes are:   Overexertion.   Overuse from repetitive motions (doing the same thing over and over).   Remaining in a certain position for a long period of time.   Improper preparation, form, or technique while performing a sport or activity.   Dehydration.   Injury.   Side effects of some medicines.   Abnormally low levels of the salts and ions in your blood (electrolytes), especially potassium and calcium. This could happen if you are taking water pills (diuretics) or you are pregnant.  Some underlying medical problems can make it more likely to develop cramps or spasms. These include, but are not limited to:   Diabetes.   Parkinson disease.   Hormone disorders, such as thyroid problems.   Alcohol abuse.    Diseases specific to muscles, joints, and bones.   Blood vessel disease where not enough blood is getting to the muscles.  HOME CARE INSTRUCTIONS   Stay well hydrated. Drink enough water and fluids to keep your urine clear or pale yellow.  It may be helpful to massage, stretch, and relax the affected muscle.  For tight or tense muscles, use a warm towel, heating pad, or hot shower water directed to the affected area.  If you are sore or have pain after a cramp or spasm, applying ice to the affected area may relieve discomfort.  Put ice in a plastic bag.  Place a towel between your skin and the bag.  Leave the ice on for 15-20 minutes, 03-04 times a day.  Medicines used to treat a known cause of cramps or spasms may help reduce their frequency or severity. Only take over-the-counter or prescription medicines as directed by your caregiver. SEEK MEDICAL CARE IF:  Your cramps or spasms get more severe, more frequent, or do not improve over time.  MAKE SURE YOU:   Understand these instructions.  Will watch your condition.  Will get help right away if you are not doing well or get worse. Document Released: 12/07/2001 Document Revised: 10/12/2012 Document Reviewed: 06/03/2012 [facility] Patient Information 2014 [place], [place].  Hip Pain The hips join the upper legs to the lower pelvis. The bones, cartilage, tendons, and muscles of the hip joint perform a lot of work each day holding your body weight and allowing you  to move around. Hip pain is a common symptom. It can range from a minor ache to severe pain on 1 or both hips. Pain may be felt on the inside of the hip joint near the groin, or the outside near the buttocks and upper thigh. There may be swelling or stiffness as well. It occurs more often when a person walks or performs activity. There are many reasons hip pain can develop. CAUSES  It is important to work with your caregiver to identify the cause since many conditions  can impact the bones, cartilage, muscles, and tendons of the hips. Causes for hip pain include:  Broken (fractured) bones.  Separation of the thighbone from the hip socket (dislocation).  Torn cartilage of the hip joint.  Swelling (inflammation) of a tendon (tendonitis), the sac within the hip joint (bursitis), or a joint.  A weakening in the abdominal wall (hernia), affecting the nerves to the hip.  Arthritis in the hip joint or lining of the hip joint.  Pinched nerves in the back, hip, or upper thigh.  A bulging disc in the spine (herniated disc).  Rarely, bone infection or cancer. DIAGNOSIS  The location of your hip pain will help your caregiver understand what may be causing the pain. A diagnosis is based on your medical history, your symptoms, results from your physical exam, and results from diagnostic tests. Diagnostic tests may include X-ray exams, a computerized magnetic scan (magnetic resonance imaging, MRI), or bone scan. TREATMENT  Treatment will depend on the cause of your hip pain. Treatment may include:  Limiting activities and resting until symptoms improve.  Crutches or other walking supports (a cane or brace).  Ice, elevation, and compression.  Physical therapy or home exercises.  Shoe inserts or special shoes.  Losing weight.  Medications to reduce pain.  Undergoing surgery. HOME CARE INSTRUCTIONS   Only take over-the-counter or prescription medicines for pain, discomfort, or fever as directed by your caregiver.  Put ice on the injured area:  Put ice in a plastic bag.  Place a towel between your skin and the bag.  Leave the ice on for 15-20 minutes at a time, 03-04 times a day.  Keep your leg raised (elevated) when possible to lessen swelling.  Avoid activities that cause pain.  Follow specific exercises as directed by your caregiver.  Sleep with a pillow between your legs on your most comfortable side.  Record how often you have hip pain,  the location of the pain, and what it feels like. This information may be helpful to you and your caregiver.  Ask your caregiver about returning to work or sports and whether you should drive.  Follow up with your caregiver for further exams, therapy, or testing as directed. SEEK MEDICAL CARE IF:   Your pain or swelling continues or worsens after 1 week.  You are feeling unwell or have chills.  You have increasing difficulty with walking.  You have a loss of sensation or other new symptoms.  You have questions or concerns. SEEK IMMEDIATE MEDICAL CARE IF:   You cannot put weight on the affected hip.  You have fallen.  You have a sudden increase in pain and swelling in your hip.  You have a fever. MAKE SURE YOU:   Understand these instructions.  Will watch your condition.  Will get help right away if you are not doing well or get worse. Document Released: 12/05/2009 Document Revised: 09/09/2011 Document Reviewed: 12/05/2009 [facility] Patient Information 2014 [place], [place]. RICE:  Routine Care for Injuries The routine care of many injuries includes Rest, Ice, Compression, and Elevation (RICE). HOME CARE INSTRUCTIONS  Rest is needed to allow your body to heal. Routine activities can usually be resumed when comfortable. Injured tendons and bones can take up to 6 weeks to heal. Tendons are the cord-like structures that attach muscle to bone.  Ice following an injury helps keep the swelling down and reduces pain.  Put ice in a plastic bag.  Place a towel between your skin and the bag.  Leave the ice on for 15-20 minutes, 03-04 times a day. Do this while awake, for the first 24 to 48 hours. After that, continue as directed by your caregiver.  Compression helps keep swelling down. It also gives support and helps with discomfort. If an elastic bandage has been applied, it should be removed and reapplied every 3 to 4 hours. It should not be applied tightly, but firmly enough to  keep swelling down. Watch fingers or toes for swelling, bluish discoloration, coldness, numbness, or excessive pain. If any of these problems occur, remove the bandage and reapply loosely. Contact your caregiver if these problems continue.  Elevation helps reduce swelling and decreases pain. With extremities, such as the arms, hands, legs, and feet, the injured area should be placed near or above the level of the heart, if possible. SEEK IMMEDIATE MEDICAL CARE IF:  You have persistent pain and swelling.  You develop redness, numbness, or unexpected weakness.  Your symptoms are getting worse rather than improving after several days. These symptoms may indicate that further evaluation or further X-rays are needed. Sometimes, X-rays may not show a small broken bone (fracture) until 1 week or 10 days later. Make a follow-up appointment with your caregiver. Ask when your X-ray results will be ready. Make sure you get your X-ray results. Document Released: 09/29/2000 Document Revised: 09/09/2011 Document Reviewed: 11/16/2010 [facility] Patient Information 2014 [place], [place].

## 2013-12-10 ENCOUNTER — Other Ambulatory Visit: Payer: Medicare Other

## 2014-01-06 ENCOUNTER — Ambulatory Visit
Admission: RE | Admit: 2014-01-06 | Discharge: 2014-01-06 | Disposition: A | Discharge status: Home / Self Care | Payer: Medicare Other | Source: Ambulatory Visit | Attending: Internal Medicine | Admitting: Internal Medicine

## 2014-01-06 DIAGNOSIS — E1339 Other specified diabetes mellitus with other diabetic ophthalmic complication: Secondary | ICD-10-CM

## 2014-02-09 ENCOUNTER — Other Ambulatory Visit: Payer: Self-pay | Admitting: Neurology

## 2014-06-13 ENCOUNTER — Ambulatory Visit (HOSPITAL_COMMUNITY)
Admission: RE | Admit: 2014-06-13 | Discharge: 2014-06-13 | Disposition: A | Discharge status: Home / Self Care | Payer: Medicare Other | Source: Ambulatory Visit | Attending: Surgery | Admitting: Surgery

## 2014-06-13 ENCOUNTER — Other Ambulatory Visit (HOSPITAL_COMMUNITY): Payer: Self-pay | Admitting: Internal Medicine

## 2014-06-13 DIAGNOSIS — G629 Polyneuropathy, unspecified: Secondary | ICD-10-CM | POA: Diagnosis not present

## 2014-06-13 DIAGNOSIS — I739 Peripheral vascular disease, unspecified: Secondary | ICD-10-CM

## 2014-08-09 ENCOUNTER — Other Ambulatory Visit: Payer: Self-pay | Admitting: Neurology

## 2014-09-22 ENCOUNTER — Emergency Department (HOSPITAL_COMMUNITY)
Admission: EM | Admit: 2014-09-22 | Discharge: 2014-09-22 | Disposition: A | Discharge status: Home / Self Care | Payer: Medicare Other | Attending: Emergency Medicine | Admitting: Emergency Medicine

## 2014-09-22 ENCOUNTER — Encounter (HOSPITAL_COMMUNITY): Payer: Self-pay | Admitting: Emergency Medicine

## 2014-09-22 DIAGNOSIS — Z720 Tobacco use: Secondary | ICD-10-CM | POA: Diagnosis not present

## 2014-09-22 DIAGNOSIS — Z8669 Personal history of other diseases of the nervous system and sense organs: Secondary | ICD-10-CM | POA: Insufficient documentation

## 2014-09-22 DIAGNOSIS — E785 Hyperlipidemia, unspecified: Secondary | ICD-10-CM | POA: Insufficient documentation

## 2014-09-22 DIAGNOSIS — Z79899 Other long term (current) drug therapy: Secondary | ICD-10-CM | POA: Diagnosis not present

## 2014-09-22 DIAGNOSIS — Z8739 Personal history of other diseases of the musculoskeletal system and connective tissue: Secondary | ICD-10-CM | POA: Diagnosis not present

## 2014-09-22 DIAGNOSIS — Z794 Long term (current) use of insulin: Secondary | ICD-10-CM | POA: Insufficient documentation

## 2014-09-22 DIAGNOSIS — I10 Essential (primary) hypertension: Secondary | ICD-10-CM | POA: Insufficient documentation

## 2014-09-22 DIAGNOSIS — E1165 Type 2 diabetes mellitus with hyperglycemia: Secondary | ICD-10-CM | POA: Diagnosis present

## 2014-09-22 DIAGNOSIS — R531 Weakness: Secondary | ICD-10-CM | POA: Diagnosis not present

## 2014-09-22 DIAGNOSIS — R739 Hyperglycemia, unspecified: Secondary | ICD-10-CM

## 2014-09-22 LAB — COMPREHENSIVE METABOLIC PANEL
ALBUMIN: 3.3 g/dL — AB (ref 3.5–5.2)
ALT: 13 U/L (ref 0–35)
AST: 13 U/L (ref 0–37)
Alkaline Phosphatase: 150 U/L — ABNORMAL HIGH (ref 39–117)
Anion gap: 8 (ref 5–15)
BILIRUBIN TOTAL: 0.4 mg/dL (ref 0.3–1.2)
BUN: 15 mg/dL (ref 6–23)
CALCIUM: 9.1 mg/dL (ref 8.4–10.5)
CO2: 28 mmol/L (ref 19–32)
CREATININE: 0.83 mg/dL (ref 0.50–1.10)
Chloride: 99 mmol/L (ref 96–112)
GFR calc Af Amer: 79 mL/min — ABNORMAL LOW (ref >90)
GFR, EST NON AFRICAN AMERICAN: 68 mL/min — AB (ref >90)
Glucose, Bld: 403 mg/dL — ABNORMAL HIGH (ref 70–99)
POTASSIUM: 3.9 mmol/L (ref 3.5–5.1)
Sodium: 135 mmol/L (ref 135–145)
Total Protein: 9.3 g/dL — ABNORMAL HIGH (ref 6.0–8.3)

## 2014-09-22 LAB — CBC
HCT: 34.8 % — ABNORMAL LOW (ref 36.0–46.0)
Hemoglobin: 11.2 g/dL — ABNORMAL LOW (ref 12.0–15.0)
MCH: 27.3 pg (ref 26.0–34.0)
MCHC: 32.2 g/dL (ref 30.0–36.0)
MCV: 84.9 fL (ref 78.0–100.0)
PLATELETS: 302 10*3/uL (ref 150–400)
RBC: 4.1 MIL/uL (ref 3.87–5.11)
RDW: 16.6 % — ABNORMAL HIGH (ref 11.5–15.5)
WBC: 7.9 10*3/uL (ref 4.0–10.5)

## 2014-09-22 LAB — URINALYSIS, ROUTINE W REFLEX MICROSCOPIC
BILIRUBIN URINE: NEGATIVE
Glucose, UA: >1000 mg/dL — AB
KETONES UR: NEGATIVE mg/dL
LEUKOCYTES UA: NEGATIVE
Nitrite: NEGATIVE
PH: 6.5 (ref 5.0–8.0)
PROTEIN: 30 mg/dL — AB
Specific Gravity, Urine: 1.026 (ref 1.005–1.030)
Urobilinogen, UA: 1 mg/dL (ref 0.0–1.0)

## 2014-09-22 LAB — CBG MONITORING, ED: Glucose-Capillary: 375 mg/dL — ABNORMAL HIGH (ref 70–99)

## 2014-09-22 LAB — URINE MICROSCOPIC-ADD ON

## 2014-09-22 MED ORDER — SODIUM CHLORIDE 0.9 % IV SOLN
Freq: Once | INTRAVENOUS | Status: AC
  Administered 2014-09-22: 19:00:00 via INTRAVENOUS

## 2014-09-22 MED ORDER — ONDANSETRON HCL 4 MG/2ML IJ SOLN
4.0000 mg | Freq: Once | INTRAMUSCULAR | Status: AC
  Administered 2014-09-22: 4 mg via INTRAVENOUS
  Filled 2014-09-22: qty 2

## 2014-09-22 NOTE — ED Notes (Addendum)
Pt c/o hyperglycemia x seven days, states she has been unable to decrease it, states she has been unable to obtain all of her diabetic medications for financial reasons, states she is only taking Lantus, states last dose Lantus was last night. CBG currently 375. Pt c/o nausea, headache. Pt states she is scheduled for cataract surgery on Monday.

## 2014-09-22 NOTE — Progress Notes (Addendum)
EDCM spoke to patient at bedside.  Per chart review, patient having difficulty affording diabetic medications.  Patient with [employer] Medicare insurance.  Patient confirms her pcp is Dr. Renford Dillsonald Polite.  Patient reports Dr. Nehemiah SettlePolite manages her diabetes.  Patient reports she is able to afford the Lantus, she just cannot afford her januvia or the victoza.  Patient reports she takes the Lantus pen.  Patient reports she just started a patient assistance program that will enable her to get all of her other medications for 16 dollars for a three month supply (all except januvia and victoza).  EDCM called [employer] representative who reports they do not assist with Lantus but do assist with Levimir.  [employer], manufacturer of Lantus insulin patient assistance program only assists patients without insurnace and those not eligible for Medicare, Medicaid products.    Phone number for [employer] given to patient.  EDCM encouraged patient to call her insurance company to see if they can offer her assistance with the cost of Lantus, januvia and victoza.  EDCM also encouraged patient to call her pcp to see if he would consider changing her medications to more affordable ones, Levimir, metformin.  Patient reports she has been on Levimir before in the past and she cannot take metformin and it causes her to have an upset stomach.  EDCM left discount card for Levimir and novalog products at patient's bedside to use in case patient's medications are changed.  Patient thankful for assistance.  No further EDCM needs at this time.  09/23/2014 A. Lysbeth Dicola RNCM 1630pm EDCM attempted to call Dr. Windy Fastonald Polite's office to inform them of patient's ED visit and medication issues, but office was closed.  EDCM inboxed Dr. Nehemiah SettlePolite regarding these issues and request for follow up appointment.  No further EDCM needs at this time.

## 2014-09-22 NOTE — Discharge Instructions (Signed)
As discussed, it is important that you follow up as soon as possible with your physician for continued management of your condition. ° °If you develop any new, or concerning changes in your condition, please return to the emergency department immediately. ° °Hyperglycemia °Hyperglycemia occurs when the glucose (sugar) in your blood is too high. Hyperglycemia can happen for many reasons, but it most often happens to people who do not know they have diabetes or are not managing their diabetes properly.  °CAUSES  °Whether you have diabetes or not, there are other causes of hyperglycemia. Hyperglycemia can occur when you have diabetes, but it can also occur in other situations that you might not be as aware of, such as: °Diabetes °· If you have diabetes and are having problems controlling your blood glucose, hyperglycemia could occur because of some of the following reasons: °¨ Not following your meal plan. °¨ Not taking your diabetes medications or not taking it properly. °¨ Exercising less or doing less activity than you normally do. °¨ Being sick. °Pre-diabetes °· This cannot be ignored. Before people develop Type 2 diabetes, they almost always have "pre-diabetes." This is when your blood glucose levels are higher than normal, but not yet high enough to be diagnosed as diabetes. Research has shown that some long-term damage to the body, especially the heart and circulatory system, may already be occurring during pre-diabetes. If you take action to manage your blood glucose when you have pre-diabetes, you may delay or prevent Type 2 diabetes from developing. °Stress °· If you have diabetes, you may be "diet" controlled or on oral medications or insulin to control your diabetes. However, you may find that your blood glucose is higher than usual in the hospital whether you have diabetes or not. This is often referred to as "stress hyperglycemia." Stress can elevate your blood glucose. This happens because of hormones  put out by the body during times of stress. If stress has been the cause of your high blood glucose, it can be followed regularly by your caregiver. That way he/she can make sure your hyperglycemia does not continue to get worse or progress to diabetes. °Steroids °· Steroids are medications that act on the infection fighting system (immune system) to block inflammation or infection. One side effect can be a rise in blood glucose. Most people can produce enough extra insulin to allow for this rise, but for those who cannot, steroids make blood glucose levels go even higher. It is not unusual for steroid treatments to "uncover" diabetes that is developing. It is not always possible to determine if the hyperglycemia will go away after the steroids are stopped. A special blood test called an A1c is sometimes done to determine if your blood glucose was elevated before the steroids were started. °SYMPTOMS °· Thirsty. °· Frequent urination. °· Dry mouth. °· Blurred vision. °· Tired or fatigue. °· Weakness. °· Sleepy. °· Tingling in feet or leg. °DIAGNOSIS  °Diagnosis is made by monitoring blood glucose in one or all of the following ways: °· A1c test. This is a chemical found in your blood. °· Fingerstick blood glucose monitoring. °· Laboratory results. °TREATMENT  °First, knowing the cause of the hyperglycemia is important before the hyperglycemia can be treated. Treatment may include, but is not be limited to: °· Education. °· Change or adjustment in medications. °· Change or adjustment in meal plan. °· Treatment for an illness, infection, etc. °· More frequent blood glucose monitoring. °· Change in exercise plan. °· Decreasing or   stopping steroids. °· Lifestyle changes. °HOME CARE INSTRUCTIONS  °· Test your blood glucose as directed. °· Exercise regularly. Your caregiver will give you instructions about exercise. Pre-diabetes or diabetes which comes on with stress is helped by exercising. °· Eat wholesome, balanced  meals. Eat often and at regular, fixed times. Your caregiver or nutritionist will give you a meal plan to guide your sugar intake. °· Being at an ideal weight is important. If needed, losing as little as 10 to 15 pounds may help improve blood glucose levels. °SEEK MEDICAL CARE IF:  °· You have questions about medicine, activity, or diet. °· You continue to have symptoms (problems such as increased thirst, urination, or weight gain). °SEEK IMMEDIATE MEDICAL CARE IF:  °· You are vomiting or have diarrhea. °· Your breath smells fruity. °· You are breathing faster or slower. °· You are very sleepy or incoherent. °· You have numbness, tingling, or pain in your feet or hands. °· You have chest pain. °· Your symptoms get worse even though you have been following your caregiver's orders. °· If you have any other questions or concerns. °Document Released: 12/11/2000 Document Revised: 09/09/2011 Document Reviewed: 10/14/2011 °ExitCare® Patient Information ©2015 [employer]. This information is not intended to replace advice given to you by your health care provider. Make sure you discuss any questions you have with your health care provider. ° °

## 2014-09-22 NOTE — ED Notes (Signed)
Pt alert and oriented x4. Respirations even and unlabored, bilateral symmetrical rise and fall of chest. Skin warm and dry. In no acute distress. Denies needs.   

## 2014-09-22 NOTE — ED Provider Notes (Signed)
CSN: [id number]     Arrival date & time 09/22/14  1714 History   First MD Initiated Contact with Patient 09/22/14 1742     Chief Complaint  Patient presents with  . Hyperglycemia  . Nausea  . Headache    HPI  Patient presents with concern of nausea, anorexia. Patient's illness began about 7 days ago, when she stopped taking some of her prescribed medication for hyperglycemia. Since that time patient has had persistent nausea, anorexia, no abdominal pain, chest pain, dyspnea, fever, chills. Patient has not had emesis, though she describes gagging. No clear alleviating or exacerbating factors.  Past Medical History  Diagnosis Date  . Diabetes mellitus   . Hypertension   . Neuropathy   . Fibromyalgia   . Cataract   . Tobacco dependence   . Hyperlipidemia    Past Surgical History  Procedure Laterality Date  . Eye surgery    . Abdominal hysterectomy     Family History  Problem Relation Age of Onset  . Diabetes Mother   . High blood pressure Father    History  Substance Use Topics  . Smoking status: Current Every Day Smoker -- 0.30 packs/day    Types: Cigarettes  . Smokeless tobacco: Never Used  . Alcohol Use: No   OB History    No data available     Review of Systems  Constitutional:       Per HPI, otherwise negative  HENT:       Per HPI, otherwise negative  Respiratory:       Per HPI, otherwise negative  Cardiovascular:       Per HPI, otherwise negative  Gastrointestinal: Positive for nausea. Negative for vomiting and abdominal pain.  Endocrine:       Negative aside from HPI  Genitourinary:       Neg aside from HPI   Musculoskeletal:       Per HPI, otherwise negative  Skin: Negative.   Neurological: Positive for weakness. Negative for syncope.      Allergies  Metformin and related  Home Medications   Prior to Admission medications   Medication Sig Start Date End Date Taking? Authorizing Provider  amLODipine (NORVASC) 10 MG tablet Take 10 mg by  mouth daily.   Yes Historical Provider, MD  atorvastatin (LIPITOR) 10 MG tablet Take 10 mg by mouth daily.   Yes Historical Provider, MD  DULoxetine (CYMBALTA) 60 MG capsule Take 60 mg by mouth daily.   Yes Historical Provider, MD  insulin glargine (LANTUS) 100 UNIT/ML injection Inject 42 Units into the skin at bedtime.    Yes Historical Provider, MD  Liraglutide (VICTOZA) 18 MG/3ML SOPN Inject 18 mg into the skin every evening.   Yes Historical Provider, MD  pregabalin (LYRICA) 50 MG capsule Take 50 mg by mouth daily.    Yes Historical Provider, MD  sitaGLIPtin (JANUVIA) 100 MG tablet Take 100 mg by mouth daily.   Yes Historical Provider, MD  valsartan-hydrochlorothiazide (DIOVAN-HCT) 160-12.5 MG per tablet Take 1 tablet by mouth daily.   Yes Historical Provider, MD  HYDROcodone-acetaminophen (LORTAB) 7.5-500 MG per tablet Take 1 tablet by mouth every 8 (eight) hours as needed for pain.    Historical Provider, MD  methocarbamol (ROBAXIN) 500 MG tablet Take 1 tablet (500 mg total) by mouth 2 (two) times daily. Patient not taking: Reported on 09/22/2014 10/25/13   Francee PiccoloJennifer Piepenbrink, PA-C  nortriptyline (PAMELOR) 25 MG capsule TAKE 1 CAPSULE BY MOUTH AT BEDTIME 02/09/14   Yijun  Terrace Arabia, MD   BP 133/59 mmHg  Pulse 62  Temp(Src) 98.2 F (36.8 C) (Oral)  Resp 16  SpO2 98% Physical Exam  Constitutional: She is oriented to person, place, and time. She appears well-developed and well-nourished. No distress.  HENT:  Head: Normocephalic and atraumatic.  Eyes: Conjunctivae and EOM are normal.  Cardiovascular: Normal rate and regular rhythm.   Pulmonary/Chest: Effort normal and breath sounds normal. No stridor. No respiratory distress.  Abdominal: She exhibits no distension. There is no tenderness. There is no rebound and no guarding.  Musculoskeletal: She exhibits no edema.  Neurological: She is alert and oriented to person, place, and time. No cranial nerve deficit.  Skin: Skin is warm and dry.   Psychiatric: She has a normal mood and affect.  Nursing note and vitals reviewed.   ED Course  Procedures (including critical care time) Labs Review Labs Reviewed  CBC - Abnormal; Notable for the following:    Hemoglobin 11.2 (*)    HCT 34.8 (*)    RDW 16.6 (*)    All other components within normal limits  CBG MONITORING, ED - Abnormal; Notable for the following:    Glucose-Capillary 375 (*)    All other components within normal limits  COMPREHENSIVE METABOLIC PANEL  URINALYSIS, ROUTINE W REFLEX MICROSCOPIC    After the initial evaluation I discussed this case with our case management services. Patient stated that she was unable to afford some of her medication, but is currently on a prescription assistance program. We attempted to obtain additional systems, this does not seem currently available for some of her brand name education.  I reviewed the results (including imaging as performed), agree with the interpretation  On repeat exam the patient appears better.  We reviewed all findings.  MDM   Patient presents with multiple concerns. Essentially it sounds like the patient is uncomfortable, is worried about her hyperglycemia. Patient has no objective evidence of neurologic deficit, no evidence for DKA, no evidence for occult infection. Patient improved to fluid resuscitation. After discussing her case with case management, not finding additional resources to obtain her medication but identified several appropriate subsidies, she is discharged in stable condition to discuss her presentation with her primary care physician.    Gerhard Munch, MD 09/22/14 2029

## 2014-12-08 NOTE — Telephone Encounter (Signed)
Error

## 2015-02-28 ENCOUNTER — Ambulatory Visit
Admission: RE | Admit: 2015-02-28 | Discharge: 2015-02-28 | Disposition: A | Discharge status: Home / Self Care | Payer: Medicare Other | Source: Ambulatory Visit | Attending: Internal Medicine | Admitting: Internal Medicine

## 2015-02-28 ENCOUNTER — Other Ambulatory Visit: Payer: Self-pay | Admitting: Internal Medicine

## 2015-02-28 DIAGNOSIS — M25512 Pain in left shoulder: Secondary | ICD-10-CM

## 2016-05-28 ENCOUNTER — Encounter (HOSPITAL_COMMUNITY): Payer: Self-pay | Admitting: Emergency Medicine

## 2016-05-28 ENCOUNTER — Ambulatory Visit (HOSPITAL_COMMUNITY)
Admission: EM | Admit: 2016-05-28 | Discharge: 2016-05-28 | Disposition: A | Discharge status: Home / Self Care | Payer: Medicare Other | Attending: Emergency Medicine | Admitting: Emergency Medicine

## 2016-05-28 DIAGNOSIS — H6123 Impacted cerumen, bilateral: Secondary | ICD-10-CM

## 2016-05-28 NOTE — ED Triage Notes (Signed)
Both ears hurting for 2 weeks.  Patient admits to using q-tips.  No pain, patient is hearing an echo

## 2016-05-28 NOTE — Discharge Instructions (Signed)
We were able to flush the wax out of your left ear. Your right ear packed with wax. Plan a drop of olive oil or vegetable oil in the right ear 2-3 times a day. This will soften the wax so it can be removed. If it hasn't come out on its own in 2 weeks, please come back so we can try and remove it. Do not use Q-tips in your ears.

## 2016-05-28 NOTE — ED Notes (Signed)
Right ear cerumen impacted and unable to flush.

## 2016-05-28 NOTE — ED Provider Notes (Signed)
[facility]    CSN: [id number] Arrival date & time: 05/28/16  1021     History   Chief Complaint Chief Complaint  Patient presents with  . Otalgia    HPI Diane Knight is a 75 y.o. female.   HPI  She is a 75 year old woman here for evaluation of ear discomfort. She states for the last 2-3 weeks her ears have felt stopped up. She denies any pain. She reports decreased hearing, and hearing a slight echo. She has been using Q-tips.  Past Medical History:  Diagnosis Date  . Cataract   . Diabetes mellitus   . Fibromyalgia   . Hyperlipidemia   . Hypertension   . Neuropathy (HCC)   . Tobacco dependence     Patient Active Problem List   Diagnosis Date Noted  . DM (diabetes mellitus) (HCC) 12/03/2012  . Peripheral neuropathy (HCC) 12/03/2012  . Fibromyalgia     Past Surgical History:  Procedure Laterality Date  . ABDOMINAL HYSTERECTOMY    . EYE SURGERY      OB History    No data available       Home Medications    Prior to Admission medications   Medication Sig Start Date End Date Taking? Authorizing Provider  aspirin EC 81 MG tablet Take 81 mg by mouth daily.   Yes Historical Provider, MD  insulin NPH-regular Human (NOVOLIN 70/30) (70-30) 100 UNIT/ML injection Inject into the skin.   Yes Historical Provider, MD  amLODipine (NORVASC) 10 MG tablet Take 10 mg by mouth daily.    Historical Provider, MD  atorvastatin (LIPITOR) 10 MG tablet Take 10 mg by mouth daily.    Historical Provider, MD  DULoxetine (CYMBALTA) 60 MG capsule Take 60 mg by mouth daily.    Historical Provider, MD  HYDROcodone-acetaminophen (LORTAB) 7.5-500 MG per tablet Take 1 tablet by mouth every 8 (eight) hours as needed for pain.    Historical Provider, MD  insulin glargine (LANTUS) 100 UNIT/ML injection Inject 42 Units into the skin at bedtime.     Historical Provider, MD  Liraglutide (VICTOZA) 18 MG/3ML SOPN Inject 18 mg into the skin every evening.    Historical Provider, MD    methocarbamol (ROBAXIN) 500 MG tablet Take 1 tablet (500 mg total) by mouth 2 (two) times daily. Patient not taking: Reported on 09/22/2014 10/25/13   Francee PiccoloJennifer Piepenbrink, PA-C  nortriptyline (PAMELOR) 25 MG capsule TAKE 1 CAPSULE BY MOUTH AT BEDTIME Patient not taking: Reported on 05/28/2016 02/09/14   Levert FeinsteinYijun Yan, MD  pregabalin (LYRICA) 50 MG capsule Take 50 mg by mouth daily.     Historical Provider, MD  sitaGLIPtin (JANUVIA) 100 MG tablet Take 100 mg by mouth daily.    Historical Provider, MD  valsartan-hydrochlorothiazide (DIOVAN-HCT) 160-12.5 MG per tablet Take 1 tablet by mouth daily.    Historical Provider, MD    Family History Family History  Problem Relation Age of Onset  . Diabetes Mother   . High blood pressure Father     Social History Social History  Substance Use Topics  . Smoking status: Current Every Day Smoker    Packs/day: 0.30    Types: Cigarettes  . Smokeless tobacco: Never Used  . Alcohol use No     Allergies   Metformin and related   Review of Systems Review of Systems As in history of present illness  Physical Exam Triage Vital Signs ED Triage Vitals [05/28/16 1135]  Enc Vitals Group     BP 169/64  Pulse Rate 66     Resp 18     Temp 98.2 F (36.8 C)     Temp Source Oral     SpO2 100 %     Weight      Height      Head Circumference      Peak Flow      Pain Score      Pain Loc      Pain Edu?      Excl. in GC?    No data found.   Updated Vital Signs BP 169/64 (BP Location: Left Arm)   Pulse 66   Temp 98.2 F (36.8 C) (Oral)   Resp 18   SpO2 100%   Visual Acuity Right Eye Distance:   Left Eye Distance:   Bilateral Distance:    Right Eye Near:   Left Eye Near:    Bilateral Near:     Physical Exam  Constitutional: She is oriented to person, place, and time. She appears well-developed and well-nourished. No distress.  HENT:  Bilateral TMs obscured by cerumen.   Cardiovascular: Normal rate.   Pulmonary/Chest: Effort  normal.  Neurological: She is alert and oriented to person, place, and time.     UC Treatments / Results  Labs (all labs ordered are listed, but only abnormal results are displayed) Labs Reviewed - No data to display  EKG  EKG Interpretation None       Radiology No results found.  Procedures Procedures (including critical care time)  Medications Ordered in UC Medications - No data to display   Initial Impression / Assessment and Plan / UC Course  I have reviewed the triage vital signs and the nursing notes.  Pertinent labs & imaging results that were available during my care of the patient were reviewed by me and considered in my medical decision making (see chart for details).  Clinical Course     Left ear successfully irrigated. TM is normal. No erythema or bleeding of the ear canal. Right ear remains with cerumen impaction. Attempted to removed by curette without success. Will have patient do olive oil drops in the ear 2-3 times a day. Follow-up in 2 weeks if wax has not resolved on methadone.  Final Clinical Impressions(s) / UC Diagnoses   Final diagnoses:  Bilateral impacted cerumen    New Prescriptions Discharge Medication List as of 05/28/2016 12:37 PM       Charm RingsErin J Deondre Marinaro, MD 05/28/16 1307

## 2016-06-15 ENCOUNTER — Emergency Department (HOSPITAL_COMMUNITY)
Admission: EM | Admit: 2016-06-15 | Discharge: 2016-06-15 | Disposition: A | Discharge status: Home / Self Care | Payer: Medicare Other | Attending: Emergency Medicine | Admitting: Emergency Medicine

## 2016-06-15 ENCOUNTER — Encounter (HOSPITAL_COMMUNITY): Payer: Self-pay | Admitting: *Deleted

## 2016-06-15 ENCOUNTER — Emergency Department (HOSPITAL_COMMUNITY): Payer: Medicare Other

## 2016-06-15 DIAGNOSIS — Z794 Long term (current) use of insulin: Secondary | ICD-10-CM | POA: Insufficient documentation

## 2016-06-15 DIAGNOSIS — Z7982 Long term (current) use of aspirin: Secondary | ICD-10-CM | POA: Insufficient documentation

## 2016-06-15 DIAGNOSIS — Z79899 Other long term (current) drug therapy: Secondary | ICD-10-CM | POA: Insufficient documentation

## 2016-06-15 DIAGNOSIS — E1142 Type 2 diabetes mellitus with diabetic polyneuropathy: Secondary | ICD-10-CM | POA: Diagnosis not present

## 2016-06-15 DIAGNOSIS — F17210 Nicotine dependence, cigarettes, uncomplicated: Secondary | ICD-10-CM | POA: Insufficient documentation

## 2016-06-15 DIAGNOSIS — H6122 Impacted cerumen, left ear: Secondary | ICD-10-CM | POA: Diagnosis not present

## 2016-06-15 DIAGNOSIS — Z5181 Encounter for therapeutic drug level monitoring: Secondary | ICD-10-CM | POA: Diagnosis not present

## 2016-06-15 DIAGNOSIS — R42 Dizziness and giddiness: Secondary | ICD-10-CM | POA: Diagnosis not present

## 2016-06-15 DIAGNOSIS — I10 Essential (primary) hypertension: Secondary | ICD-10-CM | POA: Insufficient documentation

## 2016-06-15 LAB — CBC
HEMATOCRIT: 30.2 % — AB (ref 36.0–46.0)
HEMOGLOBIN: 9.4 g/dL — AB (ref 12.0–15.0)
MCH: 25.3 pg — AB (ref 26.0–34.0)
MCHC: 31.1 g/dL (ref 30.0–36.0)
MCV: 81.2 fL (ref 78.0–100.0)
Platelets: 307 10*3/uL (ref 150–400)
RBC: 3.72 MIL/uL — ABNORMAL LOW (ref 3.87–5.11)
RDW: 19.1 % — AB (ref 11.5–15.5)
WBC: 8 10*3/uL (ref 4.0–10.5)

## 2016-06-15 LAB — DIFFERENTIAL
BASOS ABS: 0 10*3/uL (ref 0.0–0.1)
BASOS PCT: 0 %
EOS ABS: 0.3 10*3/uL (ref 0.0–0.7)
EOS PCT: 3 %
LYMPHS ABS: 2.3 10*3/uL (ref 0.7–4.0)
Lymphocytes Relative: 28 %
MONOS PCT: 5 %
Monocytes Absolute: 0.4 10*3/uL (ref 0.1–1.0)
Neutro Abs: 5 10*3/uL (ref 1.7–7.7)
Neutrophils Relative %: 64 %

## 2016-06-15 LAB — COMPREHENSIVE METABOLIC PANEL
ALT: 11 U/L — ABNORMAL LOW (ref 14–54)
AST: 15 U/L (ref 15–41)
Albumin: 2.6 g/dL — ABNORMAL LOW (ref 3.5–5.0)
Alkaline Phosphatase: 97 U/L (ref 38–126)
Anion gap: 8 (ref 5–15)
BILIRUBIN TOTAL: 0.6 mg/dL (ref 0.3–1.2)
BUN: 13 mg/dL (ref 6–20)
CHLORIDE: 104 mmol/L (ref 101–111)
CO2: 28 mmol/L (ref 22–32)
CREATININE: 0.9 mg/dL (ref 0.44–1.00)
Calcium: 8.8 mg/dL — ABNORMAL LOW (ref 8.9–10.3)
Glucose, Bld: 68 mg/dL (ref 65–99)
POTASSIUM: 3.3 mmol/L — AB (ref 3.5–5.1)
Sodium: 140 mmol/L (ref 135–145)
TOTAL PROTEIN: 9.9 g/dL — AB (ref 6.5–8.1)

## 2016-06-15 LAB — I-STAT CHEM 8, ED
BUN: 15 mg/dL (ref 6–20)
CALCIUM ION: 1.17 mmol/L (ref 1.15–1.40)
CHLORIDE: 104 mmol/L (ref 101–111)
Creatinine, Ser: 0.8 mg/dL (ref 0.44–1.00)
Glucose, Bld: 65 mg/dL (ref 65–99)
HEMATOCRIT: 30 % — AB (ref 36.0–46.0)
Hemoglobin: 10.2 g/dL — ABNORMAL LOW (ref 12.0–15.0)
Potassium: 3.2 mmol/L — ABNORMAL LOW (ref 3.5–5.1)
SODIUM: 143 mmol/L (ref 135–145)
TCO2: 28 mmol/L (ref 0–100)

## 2016-06-15 LAB — APTT: APTT: 35 s (ref 24–36)

## 2016-06-15 LAB — TROPONIN I

## 2016-06-15 LAB — PROTIME-INR
INR: 1.03
Prothrombin Time: 13.5 seconds (ref 11.4–15.2)

## 2016-06-15 LAB — POC OCCULT BLOOD, ED: FECAL OCCULT BLD: NEGATIVE

## 2016-06-15 MED ORDER — MECLIZINE HCL 25 MG PO TABS: 25.0000 mg | ORAL_TABLET | Freq: Three times a day (TID) | ORAL | 0 refills | Status: DC | PRN

## 2016-06-15 MED ORDER — MECLIZINE HCL 25 MG PO TABS
25.0000 mg | ORAL_TABLET | Freq: Once | ORAL | Status: AC
  Administered 2016-06-15: 25 mg via ORAL
  Filled 2016-06-15: qty 1

## 2016-06-15 MED ORDER — ONDANSETRON HCL 4 MG PO TABS: 4.0000 mg | ORAL_TABLET | Freq: Four times a day (QID) | ORAL | 0 refills | Status: DC

## 2016-06-15 MED ORDER — LORAZEPAM 1 MG PO TABS
1.0000 mg | ORAL_TABLET | Freq: Once | ORAL | Status: AC
  Administered 2016-06-15: 1 mg via ORAL
  Filled 2016-06-15: qty 1

## 2016-06-15 NOTE — ED Provider Notes (Signed)
[facility] Note   CSN: [id number] Arrival date & time: 06/15/16  0217  By signing my name below, I, Diane Knight, attest that this documentation has been prepared under the direction and in the presence of Glynn OctaveStephen Kerriann Kamphuis, MD. Electronically Signed: Rosario AdieWilliam Andrew Knight, ED Scribe. 06/15/16. 3:02 AM.  History   Chief Complaint Chief Complaint  Patient presents with  . Dizziness   The history is provided by the patient and medical records. No language interpreter was used.    HPI Comments: Diane Knight is a 75 y.o. female with a PMHx of DM, HTN, and fibromyalgia, who presents to the Emergency Department complaining of intermittent episodes of room-spinning dizziness onset approximately 3 weeks ago. She notes associated intermittent right-sided, temporal headache and occasional blurry vision secondary to her dizziness. Pt states that she has previously had an episode of similar dizziness; however, it resolved on its own and without medical intervention. She was seen by UC for this issue on 05/28/16 (~3 weeks ago) where they performed a bilateral ear washout following finding bilateral cerumen impactions. Her left TM was successfully irrigated at that time and found to be without acute pathologies; however, her right TM remained impacted despite irrigation. She was advised at that time to use at home oil therapy to clear the impaction and f/u w/in two weeks, but she states that she did not make this appointment. Her dizziness is exacerbated with positional changes. No h/o cardiac issues/events. Pt is a current, everyday smoker. Pt takes 81mg  Asprin daily, but is not on anticoagulant/antiplatelet therapy otherwise. She denies nausea, vomiting, abdominal pain, chest pain, fever, fever, cough, congestion, rhinorrhea, bloody/melanotic stools, or any other associated symptoms.   Past Medical History:  Diagnosis Date  . Cataract   . Diabetes mellitus   . Fibromyalgia   .  Hyperlipidemia   . Hypertension   . Neuropathy (HCC)   . Tobacco dependence    Patient Active Problem List   Diagnosis Date Noted  . DM (diabetes mellitus) (HCC) 12/03/2012  . Peripheral neuropathy (HCC) 12/03/2012  . Fibromyalgia    Past Surgical History:  Procedure Laterality Date  . ABDOMINAL HYSTERECTOMY    . EYE SURGERY     OB History    No data available     Home Medications    Prior to Admission medications   Medication Sig Start Date End Date Taking? Authorizing Provider  amLODipine (NORVASC) 10 MG tablet Take 10 mg by mouth daily.    Historical Provider, MD  aspirin EC 81 MG tablet Take 81 mg by mouth daily.    Historical Provider, MD  atorvastatin (LIPITOR) 10 MG tablet Take 10 mg by mouth daily.    Historical Provider, MD  DULoxetine (CYMBALTA) 60 MG capsule Take 60 mg by mouth daily.    Historical Provider, MD  HYDROcodone-acetaminophen (LORTAB) 7.5-500 MG per tablet Take 1 tablet by mouth every 8 (eight) hours as needed for pain.    Historical Provider, MD  insulin glargine (LANTUS) 100 UNIT/ML injection Inject 42 Units into the skin at bedtime.     Historical Provider, MD  insulin NPH-regular Human (NOVOLIN 70/30) (70-30) 100 UNIT/ML injection Inject into the skin.    Historical Provider, MD  Liraglutide (VICTOZA) 18 MG/3ML SOPN Inject 18 mg into the skin every evening.    Historical Provider, MD  methocarbamol (ROBAXIN) 500 MG tablet Take 1 tablet (500 mg total) by mouth 2 (two) times daily. Patient not taking: Reported on 09/22/2014 10/25/13  Jennifer Piepenbrink, PA-C  nortriptyline (PAMELOR) 25 MG capsule TAKE 1 CAPSULE BY MOUTH AT BEDTIME Patient not taking: Reported on 05/28/2016 02/09/14   Levert FeinsteinYijun Yan, MD  pregabalin (LYRICA) 50 MG capsule Take 50 mg by mouth daily.     Historical Provider, MD  sitaGLIPtin (JANUVIA) 100 MG tablet Take 100 mg by mouth daily.    Historical Provider, MD  valsartan-hydrochlorothiazide (DIOVAN-HCT) 160-12.5 MG per tablet Take 1  tablet by mouth daily.    Historical Provider, MD   Family History Family History  Problem Relation Age of Onset  . Diabetes Mother   . High blood pressure Father    Social History Social History  Substance Use Topics  . Smoking status: Current Every Day Smoker    Packs/day: 0.30    Types: Cigarettes  . Smokeless tobacco: Never Used  . Alcohol use No   Allergies   Metformin and related  Review of Systems Review of Systems A complete 10 system review of systems was obtained and all systems are negative except as noted in the HPI and PMH.   Physical Exam Updated Vital Signs BP 135/71 (BP Location: Left Arm)   Pulse 65   Temp 98.1 F (36.7 C) (Oral)   Resp 18   SpO2 95%   Physical Exam  Constitutional: She is oriented to person, place, and time. She appears well-developed and well-nourished. No distress.  HENT:  Head: Normocephalic and atraumatic.  Mouth/Throat: Oropharynx is clear and moist. No oropharyngeal exudate.  Cerumen impaction of the left ear.   Eyes: Conjunctivae and EOM are normal. Pupils are equal, round, and reactive to light.  Neck: Normal range of motion. Neck supple.  No meningismus.  Cardiovascular: Normal rate, regular rhythm, normal heart sounds and intact distal pulses.   No murmur heard. Pulmonary/Chest: Effort normal and breath sounds normal. No respiratory distress.  Abdominal: Soft. There is no tenderness. There is no rebound and no guarding.  Genitourinary:  Genitourinary Comments: Chaperone present throughout entire exam.   Musculoskeletal: Normal range of motion. She exhibits no edema or tenderness.  Neurological: She is alert and oriented to person, place, and time. No cranial nerve deficit. She exhibits normal muscle tone.   5/5 strength throughout. CN 2-12 intact. Equal grip strength. There is no nystagmus. Positive Romberg's testing. Ataxic gait. No ataxia on finger to nose testing.   Skin: Skin is warm.  Psychiatric: She has a normal  mood and affect. Her behavior is normal.  Nursing note and vitals reviewed.  ED Treatments / Results  DIAGNOSTIC STUDIES: Oxygen Saturation is 95% on RA, adequate by my interpretation.   COORDINATION OF CARE: 3:02 AM-Discussed next steps with pt. Pt verbalized understanding and is agreeable with the plan.   Labs (all labs ordered are listed, but only abnormal results are displayed) Labs Reviewed  CBC - Abnormal; Notable for the following:       Result Value   RBC 3.72 (*)    Hemoglobin 9.4 (*)    HCT 30.2 (*)    MCH 25.3 (*)    RDW 19.1 (*)    All other components within normal limits  COMPREHENSIVE METABOLIC PANEL - Abnormal; Notable for the following:    Potassium 3.3 (*)    Calcium 8.8 (*)    Total Protein 9.9 (*)    Albumin 2.6 (*)    ALT 11 (*)    All other components within normal limits  I-STAT CHEM 8, ED - Abnormal; Notable for the following:  Potassium 3.2 (*)    Hemoglobin 10.2 (*)    HCT 30.0 (*)    All other components within normal limits  PROTIME-INR  APTT  DIFFERENTIAL  TROPONIN I  POC OCCULT BLOOD, ED   EKG  EKG Interpretation  Date/Time:  Saturday June 15 2016 02:34:54 EST Ventricular Rate:  61 PR Interval:  166 QRS Duration: 146 QT Interval:  490 QTC Calculation: 493 R Axis:   -77 Text Interpretation:  Normal sinus rhythm Right bundle branch block Left anterior fascicular block ** Bifascicular block * Possible Lateral infarct , age undetermined Abnormal ECG No significant change was found Confirmed by Manus Gunning  MD, [place] [phone]) on 06/15/2016 2:45:35 AM      Radiology Ct Head Wo Contrast  Result Date: 06/15/2016 CLINICAL DATA:  75 year old female with headache and dizziness and unsteady gait. EXAM: CT HEAD WITHOUT CONTRAST TECHNIQUE: Contiguous axial images were obtained from the base of the skull through the vertex without intravenous contrast. COMPARISON:  Head CT dated 09/12/2009 FINDINGS: Brain: The ventricles and sulci are  appropriate in size for patient's age. Mild periventricular and deep white matter chronic microvascular ischemic changes noted. A small focal left frontal periventricular white matter hypodensity appears similar to prior CT and chronic. There is no acute intracranial hemorrhage. No mass effect or midline shift noted. There is no extra-axial fluid collection. Vascular: No hyperdense vessel or unexpected calcification. Skull: Normal. Negative for fracture or focal lesion. Sinuses/Orbits: The visualized paranasal sinuses and the left mastoid air cells are clear. There is opacification of the right mastoid air cells. Clinical correlation is recommended. The visualized orbits appear unremarkable. Other: None IMPRESSION: No acute intracranial pathology. Mild chronic microvascular ischemic changes. Opacification of the right mastoid air cells. Clinical correlation is recommended Electronically Signed   By: Elgie Collard M.D.   On: 06/15/2016 03:54   Mr Brain Wo Contrast  Result Date: 06/15/2016 CLINICAL DATA:  Intermittent episodes of vertigo and blurred vision. EXAM: MRI HEAD WITHOUT CONTRAST TECHNIQUE: Multiplanar, multiecho pulse sequences of the brain and surrounding structures were obtained without intravenous contrast. COMPARISON:  Head CT same day FINDINGS: Brain: InStent cerebellum are normal. Cerebral hemispheres show a known small insults contains a Ing E dense or weight min under. No cortical or large vessel territory insult. No mass lesion, hemorrhage, hydrocephalus or extra-axial collection. Vascular: Major vessels at the base of the brain show flow. Skull and upper cervical spine: Negative Sinuses/Orbits: Paranasal sinuses are clear. There is a mastoid effusion on the right which could relate to the presenting symptoms. No nasopharyngeal lesion is seen. Orbits appear negative. Other: None significant IMPRESSION: No acute intracranial finding. Chronic small-vessel ischemic changes of the cerebral  hemispheric white matter. Mastoid effusion on the right which could possibly relate to the clinical presentation. Electronically Signed   By: Paulina Fusi M.D.   On: 06/15/2016 06:29    Procedures Procedures   Medications Ordered in ED Medications  meclizine (ANTIVERT) tablet 25 mg (25 mg Oral Given 06/15/16 0318)    Initial Impression / Assessment and Plan / ED Course  I have reviewed the triage vital signs and the nursing notes.  Pertinent labs & imaging results that were available during my care of the patient were reviewed by me and considered in my medical decision making (see chart for details).  Clinical Course   Patient presents with 2 weeks of dizziness described as vertigo with nausea, dizziness is worse with changing positions. Saw her PCP and had earwax removal 2  weeks ago.  Hemoglobin slightly decreased. FOBT negative. CT head negative. Meclizine and zofran given. Ear wax removed from L ear.  Given age and risk factors, MRI obtained to r/o CVA.  This was negative, did show R mastoid effusion.  She has no mastoid tenderness or overlying erythema.  Feels improved, able to ambulate and tolerating PO without difficulty.  Treat for peripheral vertigo. Followup with PCP. Return precautions discussed. Final Clinical Impressions(s) / ED Diagnoses   Final diagnoses:  Vertigo   New Prescriptions New Prescriptions   No medications on file   I personally performed the services described in this documentation, which was scribed in my presence. The recorded information has been reviewed and is accurate.    Glynn Octave, MD 06/15/16 [phone]

## 2016-06-15 NOTE — ED Notes (Signed)
Pt wheeled to waiting room to be taken home. Ambulatory without difficulty. NAD. A/o x4. Verbalizes understanding of discharge instructions.

## 2016-06-15 NOTE — Discharge Instructions (Signed)
There is no evidence of stroke. Take the dizziness medicine as prescribed. Return to the ED if you develop new or worsening symptoms.

## 2016-06-15 NOTE — ED Notes (Signed)
Pt ambulated w/o any issues to the bathroom.

## 2016-06-15 NOTE — ED Notes (Signed)
Patient transported to CT 

## 2016-06-15 NOTE — ED Notes (Signed)
Patient transported to MRI 

## 2016-06-15 NOTE — ED Notes (Signed)
Pt returned from MRI °

## 2016-06-15 NOTE — ED Triage Notes (Signed)
The pt  Has had dizziness for two weeks she has seen her doctor who washed both her ears she was supposed to go back but has not yet  She is also c/o some mid chest pain  Very difficult to get a history from.  She can hardly stand and her bp has been elevated

## 2016-12-25 ENCOUNTER — Ambulatory Visit
Admission: RE | Admit: 2016-12-25 | Discharge: 2016-12-25 | Disposition: A | Discharge status: Home / Self Care | Payer: Medicare Other | Source: Ambulatory Visit | Attending: Nurse Practitioner | Admitting: Nurse Practitioner

## 2016-12-25 ENCOUNTER — Other Ambulatory Visit: Payer: Self-pay | Admitting: Nurse Practitioner

## 2016-12-25 DIAGNOSIS — E049 Nontoxic goiter, unspecified: Secondary | ICD-10-CM

## 2016-12-25 DIAGNOSIS — M542 Cervicalgia: Secondary | ICD-10-CM

## 2016-12-26 ENCOUNTER — Other Ambulatory Visit: Payer: Self-pay | Admitting: Internal Medicine

## 2016-12-26 DIAGNOSIS — E049 Nontoxic goiter, unspecified: Secondary | ICD-10-CM

## 2017-04-15 ENCOUNTER — Other Ambulatory Visit: Payer: Self-pay | Admitting: Surgery

## 2017-04-15 DIAGNOSIS — E040 Nontoxic diffuse goiter: Secondary | ICD-10-CM

## 2017-04-22 ENCOUNTER — Ambulatory Visit
Admission: RE | Admit: 2017-04-22 | Discharge: 2017-04-22 | Disposition: A | Discharge status: Home / Self Care | Payer: Medicare Other | Source: Ambulatory Visit | Attending: Surgery | Admitting: Surgery

## 2017-04-22 DIAGNOSIS — E040 Nontoxic diffuse goiter: Secondary | ICD-10-CM

## 2017-04-22 MED ORDER — IOPAMIDOL (ISOVUE-300) INJECTION 61%: 75.0000 mL | Freq: Once | INTRAVENOUS | Status: DC | PRN

## 2017-04-22 MED ORDER — IOPAMIDOL (ISOVUE-300) INJECTION 61%
75.0000 mL | Freq: Once | INTRAVENOUS | Status: AC | PRN
  Administered 2017-04-22: 75 mL via INTRAVENOUS

## 2017-04-26 ENCOUNTER — Emergency Department (HOSPITAL_COMMUNITY)
Admission: EM | Admit: 2017-04-26 | Discharge: 2017-04-26 | Disposition: A | Discharge status: Home / Self Care | Payer: Medicare Other | Attending: Emergency Medicine | Admitting: Emergency Medicine

## 2017-04-26 ENCOUNTER — Encounter (HOSPITAL_COMMUNITY): Payer: Self-pay | Admitting: *Deleted

## 2017-04-26 ENCOUNTER — Ambulatory Visit (HOSPITAL_COMMUNITY)
Admission: EM | Admit: 2017-04-26 | Discharge: 2017-04-26 | Disposition: A | Discharge status: Home / Self Care | Payer: Medicare Other | Source: Home / Self Care

## 2017-04-26 DIAGNOSIS — M542 Cervicalgia: Secondary | ICD-10-CM | POA: Diagnosis present

## 2017-04-26 DIAGNOSIS — E040 Nontoxic diffuse goiter: Secondary | ICD-10-CM

## 2017-04-26 DIAGNOSIS — Z794 Long term (current) use of insulin: Secondary | ICD-10-CM | POA: Diagnosis not present

## 2017-04-26 DIAGNOSIS — I10 Essential (primary) hypertension: Secondary | ICD-10-CM | POA: Diagnosis not present

## 2017-04-26 DIAGNOSIS — E119 Type 2 diabetes mellitus without complications: Secondary | ICD-10-CM | POA: Insufficient documentation

## 2017-04-26 DIAGNOSIS — E049 Nontoxic goiter, unspecified: Secondary | ICD-10-CM | POA: Diagnosis not present

## 2017-04-26 DIAGNOSIS — F17210 Nicotine dependence, cigarettes, uncomplicated: Secondary | ICD-10-CM | POA: Diagnosis not present

## 2017-04-26 DIAGNOSIS — Z7982 Long term (current) use of aspirin: Secondary | ICD-10-CM | POA: Diagnosis not present

## 2017-04-26 DIAGNOSIS — Z79899 Other long term (current) drug therapy: Secondary | ICD-10-CM | POA: Diagnosis not present

## 2017-04-26 LAB — BASIC METABOLIC PANEL
Anion gap: 7 (ref 5–15)
BUN: 17 mg/dL (ref 6–20)
CALCIUM: 8.2 mg/dL — AB (ref 8.9–10.3)
CO2: 24 mmol/L (ref 22–32)
CREATININE: 0.87 mg/dL (ref 0.44–1.00)
Chloride: 103 mmol/L (ref 101–111)
GFR calc non Af Amer: >60 mL/min (ref >60)
Glucose, Bld: 98 mg/dL (ref 65–99)
Potassium: 3.1 mmol/L — ABNORMAL LOW (ref 3.5–5.1)
SODIUM: 134 mmol/L — AB (ref 135–145)

## 2017-04-26 LAB — CBC WITH DIFFERENTIAL/PLATELET
BASOS ABS: 0 10*3/uL (ref 0.0–0.1)
Basophils Relative: 0 %
EOS ABS: 0.2 10*3/uL (ref 0.0–0.7)
Eosinophils Relative: 3 %
HCT: 27.7 % — ABNORMAL LOW (ref 36.0–46.0)
Hemoglobin: 8.8 g/dL — ABNORMAL LOW (ref 12.0–15.0)
LYMPHS ABS: 2.6 10*3/uL (ref 0.7–4.0)
Lymphocytes Relative: 34 %
MCH: 25.1 pg — ABNORMAL LOW (ref 26.0–34.0)
MCHC: 31.8 g/dL (ref 30.0–36.0)
MCV: 78.9 fL (ref 78.0–100.0)
MONO ABS: 0.3 10*3/uL (ref 0.1–1.0)
Monocytes Relative: 4 %
NEUTROS ABS: 4.4 10*3/uL (ref 1.7–7.7)
Neutrophils Relative %: 59 %
Platelets: 295 10*3/uL (ref 150–400)
RBC: 3.51 MIL/uL — ABNORMAL LOW (ref 3.87–5.11)
RDW: 20.2 % — AB (ref 11.5–15.5)
WBC: 7.5 10*3/uL (ref 4.0–10.5)

## 2017-04-26 LAB — CBG MONITORING, ED: Glucose-Capillary: 71 mg/dL (ref 65–99)

## 2017-04-26 LAB — T4, FREE: FREE T4: 0.78 ng/dL (ref 0.61–1.12)

## 2017-04-26 LAB — TSH: TSH: 7.393 u[IU]/mL — ABNORMAL HIGH (ref 0.350–4.500)

## 2017-04-26 MED ORDER — IBUPROFEN 800 MG PO TABS
800.0000 mg | ORAL_TABLET | Freq: Once | ORAL | Status: AC
  Administered 2017-04-26: 800 mg via ORAL
  Filled 2017-04-26: qty 1

## 2017-04-26 NOTE — ED Triage Notes (Signed)
Patient says throat started getting tighter after ct scan. Patient thinks neck is larger.  Patient nad, speaking in complete sentences.  Patient says she is having a little more difficulty swallowing.  Patient is currently chewing gum.  Patient reports eating toast and drinking soda without strangling

## 2017-04-26 NOTE — ED Notes (Signed)
Pt keeps clearing her throat.  She has not been able to sleep good lying flat

## 2017-04-26 NOTE — ED Triage Notes (Signed)
Pt in c/o neck swelling, pt had CT of neck completed Monday of this week, pt has goiter, pt able to swallow, pt has control of secretions, denies difficulty breathing, pt reports lethargy with an increase in Levothryroxine, pt c/o chronic R shoulder pain, pt denies injury, A&O x4

## 2017-04-26 NOTE — ED Notes (Signed)
Blood drawn.

## 2017-04-26 NOTE — ED Notes (Signed)
Amy yu, pa assessed patient and requested she be escorted to ed.

## 2017-04-26 NOTE — Discharge Instructions (Signed)
[facility] for pain in your neck caused by your enlarged thyroid gland. We recommend you take Ibuprofen every 8 hours as needed for pain. You can alternate this with Tylenol. Please follow up with your regular doctor and let him know you are in pain.

## 2017-04-26 NOTE — ED Notes (Signed)
Food given for low cbg

## 2017-04-26 NOTE — ED Notes (Signed)
The pt has a goiter  She has had for some time  She has much swelling in her neck and she had a c-t this past week on her neck

## 2017-04-26 NOTE — ED Provider Notes (Signed)
[facility] EMERGENCY DEPARTMENT Provider Note   CSN: [id number] Arrival date & time: 04/26/17  1546    History   Chief Complaint Chief Complaint  Patient presents with  . Goiter    HPI Diane Knight is a 76 y.o. female with history of goiter, fibromyalgia, diabetes, HTN, and hyperlipidemia who presents to the ED with complaints of neck pain that started 2-3 days ago. She has known goiter and follows up with endocrinology and surgery for this, though no notes available on Epic to review. Currently on Synthroid and compliant. Denies respiratory symptoms but does endorse mild dysphagia. However, she is eating a [place] sandwich and drinking juice when seen. States she tried ibuprofen at home which help with the pain. Denies recent illness, fever, chills, chest pain, SOB, and cough.   HPI  Past Medical History:  Diagnosis Date  . Cataract   . Diabetes mellitus   . Fibromyalgia   . Hyperlipidemia   . Hypertension   . Neuropathy   . Tobacco dependence     Patient Active Problem List   Diagnosis Date Noted  . DM (diabetes mellitus) (HCC) 12/03/2012  . Peripheral neuropathy 12/03/2012  . Fibromyalgia     Past Surgical History:  Procedure Laterality Date  . ABDOMINAL HYSTERECTOMY    . EYE SURGERY      OB History    No data available       Home Medications    Prior to Admission medications   Medication Sig Start Date End Date Taking? Authorizing Provider  acetaminophen (TYLENOL) 500 MG tablet Take 500 mg by mouth every 6 (six) hours as needed for mild pain.   Yes [provider]  amLODipine (NORVASC) 10 MG tablet Take 10 mg by mouth daily.   Yes [provider]  aspirin EC 81 MG tablet Take 81 mg by mouth daily.   Yes [provider]  atorvastatin (LIPITOR) 10 MG tablet Take 10 mg by mouth daily.   Yes [provider]  insulin NPH-regular Human (NOVOLIN 70/30) (70-30) 100 UNIT/ML injection Inject 24 Units into  the skin 2 (two) times daily with a meal.    Yes [provider]  levothyroxine (SYNTHROID, LEVOTHROID) 25 MCG tablet Take 50 mcg by mouth daily before breakfast.   Yes [provider]  pregabalin (LYRICA) 200 MG capsule Take 200 mg by mouth daily.   Yes [provider]  valsartan-hydrochlorothiazide (DIOVAN-HCT) 160-12.5 MG per tablet Take 1 tablet by mouth daily.   Yes [provider]  meclizine (ANTIVERT) 25 MG tablet Take 1 tablet (25 mg total) by mouth 3 (three) times daily as needed for dizziness. Patient not taking: Reported on 04/26/2017 06/15/16   Rancour, Jeannett Senior, MD  methocarbamol (ROBAXIN) 500 MG tablet Take 1 tablet (500 mg total) by mouth 2 (two) times daily. Patient not taking: Reported on 06/15/2016 10/25/13   Piepenbrink, Victorino Dike, PA-C  nortriptyline (PAMELOR) 25 MG capsule TAKE 1 CAPSULE BY MOUTH AT BEDTIME Patient not taking: Reported on 06/15/2016 02/09/14   Levert Feinstein, MD  ondansetron (ZOFRAN) 4 MG tablet Take 1 tablet (4 mg total) by mouth every 6 (six) hours. Patient not taking: Reported on 04/26/2017 06/15/16   Glynn Octave, MD    Family History Family History  Problem Relation Age of Onset  . Diabetes Mother   . High blood pressure Father     Social History Social History  Substance Use Topics  . Smoking status: Current Every Day Smoker  Packs/day: 0.30    Types: Cigarettes  . Smokeless tobacco: Never Used  . Alcohol use No     Allergies   Metformin and related   Review of Systems Review of Systems  Constitutional: Negative for activity change, appetite change, chills and diaphoresis.  HENT: Positive for trouble swallowing. Negative for congestion, drooling, sore throat and voice change.        There is significant goiter noted on exam   Respiratory: Negative for cough, chest tightness, shortness of breath, wheezing and stridor.   Cardiovascular: Negative for chest pain, palpitations and leg swelling.    Gastrointestinal: Negative for abdominal pain, constipation, diarrhea, nausea and vomiting.  Genitourinary: Negative for difficulty urinating and dysuria.  Musculoskeletal: Positive for arthralgias and neck pain.       R shoulder pain   Neurological: Negative for dizziness, light-headedness, numbness and headaches.  All other systems reviewed and are negative.    Physical Exam Updated Vital Signs BP (!) 141/56   Pulse (!) 48   Temp 98.1 F (36.7 C)   Resp 18   SpO2 100%   Physical Exam  Constitutional: She is oriented to person, place, and time. She appears well-developed and well-nourished. No distress.  HENT:  Head: Normocephalic and atraumatic.  Nose: Nose normal.  Mouth/Throat: Oropharynx is clear and moist. No oropharyngeal exudate.  Eyes: Pupils are equal, round, and reactive to light. Conjunctivae are normal.  Neck: Normal range of motion. Thyromegaly present.  Cardiovascular: Normal rate, regular rhythm and normal heart sounds.  Exam reveals no gallop and no friction rub.   No murmur heard. Pulmonary/Chest: Effort normal and breath sounds normal. No respiratory distress. She has no wheezes. She has no rales.  Abdominal: Soft. Bowel sounds are normal. She exhibits no distension. There is no tenderness. There is no rebound and no guarding.  Musculoskeletal: Normal range of motion. She exhibits no edema or deformity.  Neurological: She is alert and oriented to person, place, and time. No cranial nerve deficit.  Skin: Skin is warm and dry. Capillary refill takes less than 2 seconds. She is not diaphoretic.     ED Treatments / Results  Labs (all labs ordered are listed, but only abnormal results are displayed) Labs Reviewed  TSH - Abnormal; Notable for the following:       Result Value   TSH 7.393 (*)    All other components within normal limits  CBC WITH DIFFERENTIAL/PLATELET - Abnormal; Notable for the following:    RBC 3.51 (*)    Hemoglobin 8.8 (*)    HCT 27.7  (*)    MCH 25.1 (*)    RDW 20.2 (*)    All other components within normal limits  BASIC METABOLIC PANEL - Abnormal; Notable for the following:    Sodium 134 (*)    Potassium 3.1 (*)    Calcium 8.2 (*)    All other components within normal limits  T4, FREE  T3  CBG MONITORING, ED    EKG  EKG Interpretation None       Radiology No results found.  Procedures Procedures (including critical care time)  Medications Ordered in ED Medications  ibuprofen (ADVIL,MOTRIN) tablet 800 mg (not administered)     Initial Impression / Assessment and Plan / ED Course  I have reviewed the triage vital signs and the nursing notes.  Pertinent labs & imaging results that were available during my care of the patient were reviewed by me and considered in my medical decision making (  see chart for details).    HILDEGARD HLAVAC is a 76 y.o. female with history of goiter, T2DM, and fibromyalgia who presents to the ED with 2-3 day-history of mild neck pain after neck CT 4 days ago. Endorses mild dysphagia, no SOB. However, eating a [place] sandwich when seen without any dysphagia appreciated. States she follows up with endocrinology and surgery for goiter. She is afebrile and hemodynamically stable. Able to speak in full sentences. Exam remarkable for goiter. CT showed marked goiter and diffuse enlargement of bilateral thyroid lobes as well as some compression of cervical esophagus. Pain likely secondary to mass effect. TSH, free T4 and T3 ordered. Low suspicion for thyroid storm as patient appears nontoxic and is hemodynamically stable.   Patient reassessed and doing well. Remains hemodynamically stable. TSH 7 and T4 nl. She is currently on levothyroxine managed by PCP and endocrinologist. Will d/c patient home as she reports no symptoms concerning for airway compression and has remained hemodynamically stable while in the ED.   PO Ibuprofen 800 mg x1 given per patient request. Advised patient she can  continue taking Ibuprofen q8h PRN for pain and alternate it with Tylenol until she follows up with PCP, endocrinologist or surgery. Return precautions discussed. Patient voiced understanding and is in agreement with plan.   Case discussed with Dr. Jodi Mourning.   Final Clinical Impressions(s) / ED Diagnoses   Final diagnoses:  Goiter diffuse  Neck pain    New Prescriptions New Prescriptions   No medications on file     Burna Cash, MD 04/26/17 1610    Blane Ohara, MD 04/26/17 2330

## 2017-07-23 NOTE — Patient Instructions (Addendum)
Diane Knight  07/23/2017   Your procedure is scheduled on: 08-04-17  Report to [facility] Main  Entrance    Report to admitting at 11AM   Call this number if you have problems the morning of surgery [phone]     Remember: Do not eat food After Midnight. You may have clear liquids from midnight until 7am day of surgery. Nothing by mouth after 7am!     Take these medicines the morning of surgery with A SIP OF WATER: amlodipine, atorvastatin, tylenol if needed                                You may not have any metal on your body including hair pins and              piercings  Do not wear jewelry, make-up, lotions, powders or perfumes, deodorant             Do not wear nail polish.  Do not shave  48 hours prior to surgery.                  Do not bring valuables to the hospital. [facility] IS NOT             RESPONSIBLE   FOR VALUABLES.  Contacts, dentures or bridgework may not be worn into surgery.  Leave suitcase in the car. After surgery it may be brought to your room.                 Please read over the following fact sheets you were given: _____________________________________________________________________   CLEAR LIQUID DIET   Foods Allowed                                                                     Foods Excluded  Coffee and tea, regular and decaf                             liquids that you cannot  Plain Jell-O in any flavor                                             see through such as: Fruit ices (not with fruit pulp)                                     milk, soups, orange juice  Iced Popsicles                                    All solid food Carbonated beverages, regular and diet  Cranberry, grape and apple juices Sports drinks like Gatorade Lightly seasoned clear broth or consume(fat free) Sugar, honey syrup  Sample Menu Breakfast                                Lunch                                      Supper Cranberry juice                    Beef broth                            Chicken broth Jell-O                                     Grape juice                           Apple juice Coffee or tea                        Jell-O                                      Popsicle                                                Coffee or tea                        Coffee or tea  _____________________________________________________________________  How to Manage Your Diabetes Before and After Surgery  Why is it important to control my blood sugar before and after surgery? . Improving blood sugar levels before and after surgery helps healing and can limit problems. . A way of improving blood sugar control is eating a healthy diet by: o  Eating less sugar and carbohydrates o  Increasing activity/exercise o  Talking with your doctor about reaching your blood sugar goals . High blood sugars (greater than 180 mg/dL) can raise your risk of infections and slow your recovery, so you will need to focus on controlling your diabetes during the weeks before surgery. . Make sure that the doctor who takes care of your diabetes knows about your planned surgery including the date and location.    WHAT DO I DO ABOUT MY DIABETES MEDICATION?  . THE DAY BEFORE SURGERY, take only 16.8 units of NOVOLIN 70/30 INSULIN at scheduled times     . THE MORNING OF SURGERY, DO NOT TAKE NOVOLIN 70/30 INSULIN !   Patient Signature:  Date:   Nurse Signature:  Date:   Reviewed and Endorsed by [facility] Patient Education Committee, August 2015   [facility] - Preparing for Surgery Before surgery, you can play an important role.  Because skin is not sterile, your skin needs to be as free of germs as possible.  You can reduce the number of germs on your skin by  washing with CHG (chlorahexidine gluconate) soap before surgery.  CHG is an antiseptic cleaner which kills germs and bonds with the skin to  continue killing germs even after washing. Please DO NOT use if you have an allergy to CHG or antibacterial soaps.  If your skin becomes reddened/irritated stop using the CHG and inform your nurse when you arrive at Short Stay. Do not shave (including legs and underarms) for at least 48 hours prior to the first CHG shower.  You may shave your face/neck. Please follow these instructions carefully:  1.  Shower with CHG Soap the night before surgery and the  morning of Surgery.  2.  If you choose to wash your hair, wash your hair first as usual with your  normal  shampoo.  3.  After you shampoo, rinse your hair and body thoroughly to remove the  shampoo.                           4.  Use CHG as you would any other liquid soap.  You can apply chg directly  to the skin and wash                       Gently with a scrungie or clean washcloth.  5.  Apply the CHG Soap to your body ONLY FROM THE NECK DOWN.   Do not use on face/ open                           Wound or open sores. Avoid contact with eyes, ears mouth and genitals (private parts).                       Wash face,  Genitals (private parts) with your normal soap.             6.  Wash thoroughly, paying special attention to the area where your surgery  will be performed.  7.  Thoroughly rinse your body with warm water from the neck down.  8.  DO NOT shower/wash with your normal soap after using and rinsing off  the CHG Soap.                9.  Pat yourself dry with a clean towel.            10.  Wear clean pajamas.            11.  Place clean sheets on your bed the night of your first shower and do not  sleep with pets. Day of Surgery : Do not apply any lotions/deodorants the morning of surgery.  Please wear clean clothes to the hospital/surgery center.  FAILURE TO FOLLOW THESE INSTRUCTIONS MAY RESULT IN THE CANCELLATION OF YOUR SURGERY PATIENT SIGNATURE_________________________________  NURSE  SIGNATURE__________________________________  ________________________________________________________________________

## 2017-07-23 NOTE — Progress Notes (Signed)
Please place orders in EPIC as patient is being scheduled for a pre-op appointment! Thank you! 

## 2017-07-25 ENCOUNTER — Ambulatory Visit: Payer: Self-pay | Admitting: Surgery

## 2017-07-25 ENCOUNTER — Ambulatory Visit (HOSPITAL_COMMUNITY)
Admission: RE | Admit: 2017-07-25 | Discharge: 2017-07-25 | Disposition: A | Discharge status: Home / Self Care | Payer: Medicare Other | Source: Ambulatory Visit | Attending: Anesthesiology | Admitting: Anesthesiology

## 2017-07-25 ENCOUNTER — Other Ambulatory Visit: Payer: Self-pay

## 2017-07-25 ENCOUNTER — Encounter (HOSPITAL_COMMUNITY)
Admission: RE | Admit: 2017-07-25 | Discharge: 2017-07-25 | Disposition: A | Discharge status: Home / Self Care | Payer: Medicare Other | Source: Ambulatory Visit | Attending: Surgery | Admitting: Surgery

## 2017-07-25 ENCOUNTER — Encounter (HOSPITAL_COMMUNITY): Payer: Self-pay

## 2017-07-25 DIAGNOSIS — Z01818 Encounter for other preprocedural examination: Secondary | ICD-10-CM | POA: Insufficient documentation

## 2017-07-25 DIAGNOSIS — E049 Nontoxic goiter, unspecified: Secondary | ICD-10-CM | POA: Diagnosis not present

## 2017-07-25 DIAGNOSIS — Z01811 Encounter for preprocedural respiratory examination: Secondary | ICD-10-CM

## 2017-07-25 DIAGNOSIS — E119 Type 2 diabetes mellitus without complications: Secondary | ICD-10-CM | POA: Insufficient documentation

## 2017-07-25 HISTORY — DX: Nontoxic goiter, unspecified: E04.9

## 2017-07-25 LAB — CBC
HEMATOCRIT: 29 % — AB (ref 36.0–46.0)
HEMOGLOBIN: 9.2 g/dL — AB (ref 12.0–15.0)
MCH: 25.3 pg — AB (ref 26.0–34.0)
MCHC: 31.7 g/dL (ref 30.0–36.0)
MCV: 79.7 fL (ref 78.0–100.0)
Platelets: 308 10*3/uL (ref 150–400)
RBC: 3.64 MIL/uL — ABNORMAL LOW (ref 3.87–5.11)
RDW: 19.6 % — ABNORMAL HIGH (ref 11.5–15.5)
WBC: 7.8 10*3/uL (ref 4.0–10.5)

## 2017-07-25 LAB — BASIC METABOLIC PANEL
ANION GAP: 7 (ref 5–15)
BUN: 19 mg/dL (ref 6–20)
CALCIUM: 8.7 mg/dL — AB (ref 8.9–10.3)
CO2: 28 mmol/L (ref 22–32)
Chloride: 104 mmol/L (ref 101–111)
Creatinine, Ser: 0.94 mg/dL (ref 0.44–1.00)
GFR calc Af Amer: >60 mL/min (ref >60)
GFR calc non Af Amer: 57 mL/min — ABNORMAL LOW (ref >60)
GLUCOSE: 100 mg/dL — AB (ref 65–99)
POTASSIUM: 3.7 mmol/L (ref 3.5–5.1)
Sodium: 139 mmol/L (ref 135–145)

## 2017-07-25 LAB — GLUCOSE, CAPILLARY: Glucose-Capillary: 87 mg/dL (ref 65–99)

## 2017-07-25 LAB — HEMOGLOBIN A1C
HEMOGLOBIN A1C: 5.4 % (ref 4.8–5.6)
Mean Plasma Glucose: 108.28 mg/dL

## 2017-07-25 NOTE — H&P (View-Only) (Signed)
General Surgery - [facility]  Diane Knight DOB: 01/17/1941 Widowed / Language: English / Race: Black or African American Female   History of Present Illness  The patient is a 77 year old female who presents with a complaint of Enlarged thyroid.  CC: Thyroid goiter with compression of esophagus  Patient returns for follow-up. She was evaluated last fall for enlarging thyroid goiter with compressive symptoms. At my request the patient underwent a CT scan of the neck on April 22, 2017. This showed marked enlargement of the thyroid bilaterally. There was compression of the esophagus due to the goiter. Trachea was mildly flattened side to side but was not significantly narrowed. Right thyroid lobe measured 12 cm in greatest dimension and left lobe measured 13 cm in greatest dimension. There was a cystic lesion in the right supraclavicular fossa either representing a lymphocele or possibly a necrotic lymph node. No other lymphadenopathy was identified. Patient returns today to review this report. She has had time to settle some of her personal affairs and is interested in proceeding with surgery in the near future. She does note continued solid food dysphagia. She denies any dyspnea. She does have some discomfort in the neck and upper extremities. She denies any hoarseness.   Allergies  MetFORMIN HCl *ANTIDIABETICS*  Allergies Reconciled   Medication History AmLODIPine Besylate (10MG Tablet, Oral) Active. Aspirin (81MG Tablet, Oral) Active. Atorvastatin Calcium (10MG Tablet, Oral) Active. Valsartan (160MG Capsule, Oral) Active. Levothyroxine Sodium (125MCG Tablet, Oral) Active. Lyrica (75MG Capsule, Oral) Active. Medications Reconciled  Vitals  Weight: 165.8 lb Height: 66in Body Surface Area: 1.85 m Body Mass Index: 26.76 kg/m  Temp.: 98.5F  Pulse: 74 (Regular)  BP: 116/72 (Sitting, Left Arm, Standard)  Physical Exam  See  vital signs recorded above  GENERAL APPEARANCE Development: normal Nutritional status: normal Gross deformities: none  SKIN Rash, lesions, ulcers: none Induration, erythema: none Nodules: none palpable  EYES Conjunctiva and lids: normal Pupils: equal and reactive Iris: normal bilaterally  EARS, NOSE, MOUTH, THROAT External ears: no lesion or deformity External nose: no lesion or deformity Hearing: grossly normal Lips: no lesion or deformity Dentition: normal for age Oral mucosa: moist  NECK Symmetric: no Trachea: midline Thyroid: Markedly enlarged thyroid bilaterally, multinodular, firm, nontender. Measurements as noted. Voice quality is normal. No sign of stridor.  CHEST Respiratory effort: normal Retraction or accessory muscle use: no Breath sounds: normal bilaterally Rales, rhonchi, wheeze: none  CARDIOVASCULAR Auscultation: regular rhythm, normal rate Murmurs: none Pulses: carotid and radial pulse 2+ palpable Lower extremity edema: none Lower extremity varicosities: none  MUSCULOSKELETAL Station and gait: normal Digits and nails: no clubbing or cyanosis Muscle strength: grossly normal all extremities Range of motion: grossly normal all extremities Deformity: none  LYMPHATIC Cervical: none palpable Supraclavicular: none palpable  PSYCHIATRIC Oriented to person, place, and time: yes Mood and affect: normal for situation Judgment and insight: appropriate for situation    Assessment & Plan  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 We reviewed her CT scan results from October 2018. We discussed her clinical symptoms of compression.  Patient has an enlarged thyroid goiter with compression of the cervical esophagus. She has not had airway compromise. We discussed total thyroidectomy as the  treatment of choice. We discussed potential fits and risk of the procedure including the risk of injury to recurrent laryngeal nerves (5%) and potential injury to   parathyroid glands. We discussed the need for lifelong thyroid hormone replacement but the patient is already taking levothyroxine 50 g daily. We discussed the cystic lesion in the right neck and it will most likely be removed at the time of surgery for evaluation. We discussed the hospital stay to be anticipated. We discussed her postoperative recovery. Patient understands and wishes to proceed with surgery either in February or March of 77 year old  The risks and benefits of the procedure have been discussed at length with the patient. The patient understands the proposed procedure, potential alternative treatments, and the course of recovery to be expected. All of the patient's questions have been answered at this time. The patient wishes to proceed with surgery.  Jule Whitsel, MD [facility] Office: [phone]    

## 2017-07-25 NOTE — Progress Notes (Signed)
LOV Dr Talmage CoinJeffrey Kerr 06-18-17 [facility]  LOV Drinda ButtsSHannon Tann NP 05-12-17 on [facility] from [facility]   TSH 06-16-17 [facility]  HGBA1C, BMP, FREE T4 [facility]

## 2017-07-25 NOTE — H&P (Signed)
General Surgery - [facility]  Teagyn V Parish DOB: 10/17/1940 Widowed / Language: English / Race: Black or African American Female   History of Present Illness  The patient is a 77 year old female who presents with a complaint of Enlarged thyroid.  CC: Thyroid goiter with compression of esophagus  Patient returns for follow-up. She was evaluated last fall for enlarging thyroid goiter with compressive symptoms. At my request the patient underwent a CT scan of the neck on April 22, 2017. This showed marked enlargement of the thyroid bilaterally. There was compression of the esophagus due to the goiter. Trachea was mildly flattened side to side but was not significantly narrowed. Right thyroid lobe measured 12 cm in greatest dimension and left lobe measured 13 cm in greatest dimension. There was a cystic lesion in the right supraclavicular fossa either representing a lymphocele or possibly a necrotic lymph node. No other lymphadenopathy was identified. Patient returns today to review this report. She has had time to settle some of her personal affairs and is interested in proceeding with surgery in the near future. She does note continued solid food dysphagia. She denies any dyspnea. She does have some discomfort in the neck and upper extremities. She denies any hoarseness.   Allergies  MetFORMIN HCl *ANTIDIABETICS*  Allergies Reconciled   Medication History AmLODIPine Besylate (10MG Tablet, Oral) Active. Aspirin (81MG Tablet, Oral) Active. Atorvastatin Calcium (10MG Tablet, Oral) Active. Valsartan (160MG Capsule, Oral) Active. Levothyroxine Sodium (125MCG Tablet, Oral) Active. Lyrica (75MG Capsule, Oral) Active. Medications Reconciled  Vitals  Weight: 165.8 lb Height: 66in Body Surface Area: 1.85 m Body Mass Index: 26.76 kg/m  Temp.: 98.5F  Pulse: 74 (Regular)  BP: 116/72 (Sitting, Left Arm, Standard)  Physical Exam  See  vital signs recorded above  GENERAL APPEARANCE Development: normal Nutritional status: normal Gross deformities: none  SKIN Rash, lesions, ulcers: none Induration, erythema: none Nodules: none palpable  EYES Conjunctiva and lids: normal Pupils: equal and reactive Iris: normal bilaterally  EARS, NOSE, MOUTH, THROAT External ears: no lesion or deformity External nose: no lesion or deformity Hearing: grossly normal Lips: no lesion or deformity Dentition: normal for age Oral mucosa: moist  NECK Symmetric: no Trachea: midline Thyroid: Markedly enlarged thyroid bilaterally, multinodular, firm, nontender. Measurements as noted. Voice quality is normal. No sign of stridor.  CHEST Respiratory effort: normal Retraction or accessory muscle use: no Breath sounds: normal bilaterally Rales, rhonchi, wheeze: none  CARDIOVASCULAR Auscultation: regular rhythm, normal rate Murmurs: none Pulses: carotid and radial pulse 2+ palpable Lower extremity edema: none Lower extremity varicosities: none  MUSCULOSKELETAL Station and gait: normal Digits and nails: no clubbing or cyanosis Muscle strength: grossly normal all extremities Range of motion: grossly normal all extremities Deformity: none  LYMPHATIC Cervical: none palpable Supraclavicular: none palpable  PSYCHIATRIC Oriented to person, place, and time: yes Mood and affect: normal for situation Judgment and insight: appropriate for situation    Assessment & Plan  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 We reviewed her CT scan results from October 2018. We discussed her clinical symptoms of compression.  Patient has an enlarged thyroid goiter with compression of the cervical esophagus. She has not had airway compromise. We discussed total thyroidectomy as the  treatment of choice. We discussed potential fits and risk of the procedure including the risk of injury to recurrent laryngeal nerves (5%) and potential injury to   parathyroid glands. We discussed the need for lifelong thyroid hormone replacement but the patient is already taking levothyroxine 50 g daily. We discussed the cystic lesion in the right neck and it will most likely be removed at the time of surgery for evaluation. We discussed the hospital stay to be anticipated. We discussed her postoperative recovery. Patient understands and wishes to proceed with surgery either in February or March of this year.  The risks and benefits of the procedure have been discussed at length with the patient. The patient understands the proposed procedure, potential alternative treatments, and the course of recovery to be expected. All of the patient's questions have been answered at this time. The patient wishes to proceed with surgery.  Demarkus Remmel, MD [facility] Office: [phone]    

## 2017-08-01 NOTE — Progress Notes (Signed)
Notified pt of surgery time change-arrive @ 1030, surgery @ 1230. NPO after Midnight. Pt verbalized understanding.

## 2017-08-03 ENCOUNTER — Ambulatory Visit: Payer: Self-pay | Admitting: Surgery

## 2017-08-03 ENCOUNTER — Encounter (HOSPITAL_COMMUNITY): Payer: Self-pay | Admitting: Surgery

## 2017-08-03 DIAGNOSIS — E049 Nontoxic goiter, unspecified: Secondary | ICD-10-CM | POA: Diagnosis present

## 2017-08-03 DIAGNOSIS — E039 Hypothyroidism, unspecified: Secondary | ICD-10-CM | POA: Diagnosis present

## 2017-08-04 ENCOUNTER — Encounter (HOSPITAL_COMMUNITY): Payer: Self-pay | Admitting: Emergency Medicine

## 2017-08-04 ENCOUNTER — Ambulatory Visit (HOSPITAL_COMMUNITY): Payer: Medicare Other | Admitting: Certified Registered Nurse Anesthetist

## 2017-08-04 ENCOUNTER — Observation Stay (HOSPITAL_COMMUNITY)
Admission: RE | Admit: 2017-08-04 | Discharge: 2017-08-06 | Disposition: A | Discharge status: Home / Self Care | Payer: Medicare Other | Source: Ambulatory Visit | Attending: Surgery | Admitting: Surgery

## 2017-08-04 ENCOUNTER — Encounter (HOSPITAL_COMMUNITY)
Admission: RE | Disposition: A | Discharge status: Home / Self Care | Payer: Self-pay | Source: Ambulatory Visit | Attending: Surgery

## 2017-08-04 ENCOUNTER — Other Ambulatory Visit: Payer: Self-pay

## 2017-08-04 DIAGNOSIS — Z794 Long term (current) use of insulin: Secondary | ICD-10-CM | POA: Insufficient documentation

## 2017-08-04 DIAGNOSIS — F17200 Nicotine dependence, unspecified, uncomplicated: Secondary | ICD-10-CM | POA: Diagnosis not present

## 2017-08-04 DIAGNOSIS — M797 Fibromyalgia: Secondary | ICD-10-CM | POA: Insufficient documentation

## 2017-08-04 DIAGNOSIS — Z79899 Other long term (current) drug therapy: Secondary | ICD-10-CM | POA: Diagnosis not present

## 2017-08-04 DIAGNOSIS — E049 Nontoxic goiter, unspecified: Secondary | ICD-10-CM | POA: Diagnosis not present

## 2017-08-04 DIAGNOSIS — E063 Autoimmune thyroiditis: Principal | ICD-10-CM | POA: Insufficient documentation

## 2017-08-04 DIAGNOSIS — E039 Hypothyroidism, unspecified: Secondary | ICD-10-CM | POA: Diagnosis present

## 2017-08-04 DIAGNOSIS — I10 Essential (primary) hypertension: Secondary | ICD-10-CM | POA: Insufficient documentation

## 2017-08-04 DIAGNOSIS — J398 Other specified diseases of upper respiratory tract: Secondary | ICD-10-CM | POA: Diagnosis not present

## 2017-08-04 DIAGNOSIS — E119 Type 2 diabetes mellitus without complications: Secondary | ICD-10-CM | POA: Diagnosis not present

## 2017-08-04 HISTORY — PX: THYROIDECTOMY: SHX17

## 2017-08-04 LAB — GLUCOSE, CAPILLARY
GLUCOSE-CAPILLARY: 84 mg/dL (ref 65–99)
GLUCOSE-CAPILLARY: 193 mg/dL — AB (ref 65–99)
Glucose-Capillary: 148 mg/dL — ABNORMAL HIGH (ref 65–99)
Glucose-Capillary: 170 mg/dL — ABNORMAL HIGH (ref 65–99)

## 2017-08-04 SURGERY — THYROIDECTOMY
Anesthesia: General | Site: Neck

## 2017-08-04 MED ORDER — CEFAZOLIN SODIUM-DEXTROSE 2-4 GM/100ML-% IV SOLN
2.0000 g | INTRAVENOUS | Status: AC
  Administered 2017-08-04: 2 g via INTRAVENOUS
  Filled 2017-08-04: qty 100

## 2017-08-04 MED ORDER — PROMETHAZINE HCL 25 MG/ML IJ SOLN: 6.2500 mg | INTRAMUSCULAR | Status: DC | PRN

## 2017-08-04 MED ORDER — EPHEDRINE SULFATE 50 MG/ML IJ SOLN
INTRAMUSCULAR | Status: DC | PRN
  Administered 2017-08-04: 5 mg via INTRAVENOUS

## 2017-08-04 MED ORDER — CEFAZOLIN SODIUM-DEXTROSE 2-4 GM/100ML-% IV SOLN: 2.0000 g | INTRAVENOUS | Status: DC

## 2017-08-04 MED ORDER — CALCIUM CARBONATE 1250 (500 CA) MG PO TABS
2.0000 | ORAL_TABLET | Freq: Three times a day (TID) | ORAL | Status: DC
  Administered 2017-08-04: 1000 mg via ORAL
  Filled 2017-08-04 (×2): qty 1

## 2017-08-04 MED ORDER — FENTANYL CITRATE (PF) 100 MCG/2ML IJ SOLN
25.0000 ug | INTRAMUSCULAR | Status: DC | PRN
  Administered 2017-08-04 (×3): 50 ug via INTRAVENOUS

## 2017-08-04 MED ORDER — KCL IN DEXTROSE-NACL 20-5-0.45 MEQ/L-%-% IV SOLN
INTRAVENOUS | Status: DC
  Administered 2017-08-04 – 2017-08-05 (×2): via INTRAVENOUS
  Filled 2017-08-04 (×3): qty 1000

## 2017-08-04 MED ORDER — ACETAMINOPHEN 650 MG RE SUPP: 650.0000 mg | Freq: Four times a day (QID) | RECTAL | Status: DC | PRN

## 2017-08-04 MED ORDER — CHLORHEXIDINE GLUCONATE CLOTH 2 % EX PADS: 6.0000 | MEDICATED_PAD | Freq: Once | CUTANEOUS | Status: DC

## 2017-08-04 MED ORDER — ONDANSETRON HCL 4 MG/2ML IJ SOLN
INTRAMUSCULAR | Status: AC
  Filled 2017-08-04: qty 2

## 2017-08-04 MED ORDER — PROPOFOL 10 MG/ML IV BOLUS
INTRAVENOUS | Status: DC | PRN
  Administered 2017-08-04: 150 mg via INTRAVENOUS

## 2017-08-04 MED ORDER — LACTATED RINGERS IV SOLN
INTRAVENOUS | Status: DC
  Administered 2017-08-04: 11:00:00 via INTRAVENOUS

## 2017-08-04 MED ORDER — LIDOCAINE 2% (20 MG/ML) 5 ML SYRINGE
INTRAMUSCULAR | Status: DC | PRN
  Administered 2017-08-04: 50 mg via INTRAVENOUS

## 2017-08-04 MED ORDER — INSULIN ASPART 100 UNIT/ML ~~LOC~~ SOLN
0.0000 [IU] | Freq: Three times a day (TID) | SUBCUTANEOUS | Status: DC
Start: 2017-08-04 — End: 2017-08-06
  Administered 2017-08-04 – 2017-08-06 (×4): 3 [IU] via SUBCUTANEOUS

## 2017-08-04 MED ORDER — SUGAMMADEX SODIUM 200 MG/2ML IV SOLN
INTRAVENOUS | Status: AC
  Filled 2017-08-04: qty 2

## 2017-08-04 MED ORDER — TRAMADOL HCL 50 MG PO TABS: 50.0000 mg | ORAL_TABLET | Freq: Four times a day (QID) | ORAL | Status: DC | PRN

## 2017-08-04 MED ORDER — EPHEDRINE 5 MG/ML INJ
INTRAVENOUS | Status: AC
  Filled 2017-08-04: qty 10

## 2017-08-04 MED ORDER — AMLODIPINE BESYLATE 10 MG PO TABS
10.0000 mg | ORAL_TABLET | Freq: Every day | ORAL | Status: DC
  Administered 2017-08-05 – 2017-08-06 (×2): 10 mg via ORAL
  Filled 2017-08-04 (×2): qty 1

## 2017-08-04 MED ORDER — MEPERIDINE HCL 50 MG/ML IJ SOLN: 6.2500 mg | INTRAMUSCULAR | Status: DC | PRN

## 2017-08-04 MED ORDER — LEVOTHYROXINE SODIUM 100 MCG PO TABS
100.0000 ug | ORAL_TABLET | Freq: Every day | ORAL | Status: DC
  Administered 2017-08-05 – 2017-08-06 (×2): 100 ug via ORAL
  Filled 2017-08-04 (×2): qty 1

## 2017-08-04 MED ORDER — LACTATED RINGERS IV SOLN: INTRAVENOUS | Status: DC

## 2017-08-04 MED ORDER — SUCCINYLCHOLINE CHLORIDE 200 MG/10ML IV SOSY
PREFILLED_SYRINGE | INTRAVENOUS | Status: DC | PRN
  Administered 2017-08-04: 100 mg via INTRAVENOUS

## 2017-08-04 MED ORDER — LIDOCAINE 2% (20 MG/ML) 5 ML SYRINGE
INTRAMUSCULAR | Status: AC
  Filled 2017-08-04: qty 5

## 2017-08-04 MED ORDER — ACETAMINOPHEN 325 MG PO TABS: 650.0000 mg | ORAL_TABLET | Freq: Four times a day (QID) | ORAL | Status: DC | PRN

## 2017-08-04 MED ORDER — SUCCINYLCHOLINE CHLORIDE 200 MG/10ML IV SOSY
PREFILLED_SYRINGE | INTRAVENOUS | Status: AC
  Filled 2017-08-04: qty 10

## 2017-08-04 MED ORDER — DEXAMETHASONE SODIUM PHOSPHATE 10 MG/ML IJ SOLN
INTRAMUSCULAR | Status: AC
  Filled 2017-08-04: qty 1

## 2017-08-04 MED ORDER — SUGAMMADEX SODIUM 200 MG/2ML IV SOLN
INTRAVENOUS | Status: DC | PRN
  Administered 2017-08-04: 150 mg via INTRAVENOUS

## 2017-08-04 MED ORDER — VALSARTAN-HYDROCHLOROTHIAZIDE 160-12.5 MG PO TABS: 1.0000 | ORAL_TABLET | Freq: Every day | ORAL | Status: DC

## 2017-08-04 MED ORDER — FENTANYL CITRATE (PF) 100 MCG/2ML IJ SOLN
INTRAMUSCULAR | Status: DC | PRN
  Administered 2017-08-04 (×5): 50 ug via INTRAVENOUS

## 2017-08-04 MED ORDER — HYDROMORPHONE HCL 1 MG/ML IJ SOLN
1.0000 mg | INTRAMUSCULAR | Status: DC | PRN
  Administered 2017-08-04: 1 mg via INTRAVENOUS
  Filled 2017-08-04: qty 1

## 2017-08-04 MED ORDER — FENTANYL CITRATE (PF) 100 MCG/2ML IJ SOLN
INTRAMUSCULAR | Status: AC
  Filled 2017-08-04: qty 4

## 2017-08-04 MED ORDER — ONDANSETRON 4 MG PO TBDP: 4.0000 mg | ORAL_TABLET | Freq: Four times a day (QID) | ORAL | Status: DC | PRN

## 2017-08-04 MED ORDER — FENTANYL CITRATE (PF) 250 MCG/5ML IJ SOLN
INTRAMUSCULAR | Status: AC
  Filled 2017-08-04: qty 5

## 2017-08-04 MED ORDER — ROCURONIUM BROMIDE 50 MG/5ML IV SOSY
PREFILLED_SYRINGE | INTRAVENOUS | Status: DC | PRN
  Administered 2017-08-04 (×3): 10 mg via INTRAVENOUS
  Administered 2017-08-04: 40 mg via INTRAVENOUS

## 2017-08-04 MED ORDER — HYDROCHLOROTHIAZIDE 12.5 MG PO CAPS
12.5000 mg | ORAL_CAPSULE | Freq: Every day | ORAL | Status: DC
  Administered 2017-08-04 – 2017-08-06 (×3): 12.5 mg via ORAL
  Filled 2017-08-04 (×3): qty 1

## 2017-08-04 MED ORDER — ONDANSETRON HCL 4 MG/2ML IJ SOLN
INTRAMUSCULAR | Status: DC | PRN
  Administered 2017-08-04: 4 mg via INTRAVENOUS

## 2017-08-04 MED ORDER — DEXAMETHASONE SODIUM PHOSPHATE 10 MG/ML IJ SOLN
INTRAMUSCULAR | Status: DC | PRN
  Administered 2017-08-04: 10 mg via INTRAVENOUS

## 2017-08-04 MED ORDER — PROPOFOL 10 MG/ML IV BOLUS
INTRAVENOUS | Status: AC
  Filled 2017-08-04: qty 20

## 2017-08-04 MED ORDER — HYDROCODONE-ACETAMINOPHEN 5-325 MG PO TABS
1.0000 | ORAL_TABLET | ORAL | Status: DC | PRN
  Administered 2017-08-04: 2 via ORAL
  Administered 2017-08-05 – 2017-08-06 (×4): 1 via ORAL
  Filled 2017-08-04: qty 2
  Filled 2017-08-04 (×4): qty 1

## 2017-08-04 MED ORDER — INSULIN ASPART PROT & ASPART (70-30 MIX) 100 UNIT/ML ~~LOC~~ SUSP
20.0000 [IU] | Freq: Two times a day (BID) | SUBCUTANEOUS | Status: DC
  Administered 2017-08-04 – 2017-08-06 (×4): 20 [IU] via SUBCUTANEOUS
  Filled 2017-08-04: qty 10

## 2017-08-04 MED ORDER — ONDANSETRON HCL 4 MG/2ML IJ SOLN: 4.0000 mg | Freq: Four times a day (QID) | INTRAMUSCULAR | Status: DC | PRN

## 2017-08-04 MED ORDER — ROCURONIUM BROMIDE 10 MG/ML (PF) SYRINGE
PREFILLED_SYRINGE | INTRAVENOUS | Status: AC
  Filled 2017-08-04: qty 5

## 2017-08-04 MED ORDER — IRBESARTAN 150 MG PO TABS
150.0000 mg | ORAL_TABLET | Freq: Every day | ORAL | Status: DC
  Administered 2017-08-04 – 2017-08-06 (×3): 150 mg via ORAL
  Filled 2017-08-04 (×3): qty 1

## 2017-08-04 SURGICAL SUPPLY — 35 items
ATTRACTOMAT 16X20 MAGNETIC DRP (DRAPES) ×2 IMPLANT
BLADE SURG 15 STRL LF DISP TIS (BLADE) ×1 IMPLANT
BLADE SURG 15 STRL SS (BLADE) ×2
CHLORAPREP W/TINT 26ML (MISCELLANEOUS) ×3 IMPLANT
CLIP VESOCCLUDE MED 6/CT (CLIP) ×9 IMPLANT
CLIP VESOCCLUDE SM WIDE 6/CT (CLIP) ×6 IMPLANT
COVER SURGICAL LIGHT HANDLE (MISCELLANEOUS) ×2 IMPLANT
DISSECTOR ROUND CHERRY 3/8 STR (MISCELLANEOUS) ×1 IMPLANT
DRAPE LAPAROTOMY T 98X78 PEDS (DRAPES) ×2 IMPLANT
ELECT PENCIL ROCKER SW 15FT (MISCELLANEOUS) ×2 IMPLANT
ELECT REM PT RETURN 15FT ADLT (MISCELLANEOUS) ×2 IMPLANT
GAUZE SPONGE 4X4 12PLY STRL (GAUZE/BANDAGES/DRESSINGS) IMPLANT
GAUZE SPONGE 4X4 16PLY XRAY LF (GAUZE/BANDAGES/DRESSINGS) ×2 IMPLANT
GLOVE SURG ORTHO 8.0 STRL STRW (GLOVE) ×2 IMPLANT
GOWN STRL REUS W/TWL XL LVL3 (GOWN DISPOSABLE) ×5 IMPLANT
HEMOSTAT SURGICEL 2X4 FIBR (HEMOSTASIS) IMPLANT
ILLUMINATOR WAVEGUIDE N/F (MISCELLANEOUS) IMPLANT
KIT BASIN OR (CUSTOM PROCEDURE TRAY) ×2 IMPLANT
LIGHT WAVEGUIDE WIDE FLAT (MISCELLANEOUS) ×1 IMPLANT
PACK BASIC VI WITH GOWN DISP (CUSTOM PROCEDURE TRAY) ×2 IMPLANT
POWDER SURGICEL 3.0 GRAM (HEMOSTASIS) ×1 IMPLANT
SHEARS HARMONIC 9CM CVD (BLADE) ×2 IMPLANT
SPONGE LAP 18X18 X RAY DECT (DISPOSABLE) ×1 IMPLANT
STAPLER VISISTAT 35W (STAPLE) ×1 IMPLANT
STRIP CLOSURE SKIN 1/2X4 (GAUZE/BANDAGES/DRESSINGS) ×2 IMPLANT
SUT MNCRL AB 4-0 PS2 18 (SUTURE) ×2 IMPLANT
SUT SILK 2 0 (SUTURE)
SUT SILK 2-0 18XBRD TIE 12 (SUTURE) IMPLANT
SUT SILK 3 0 (SUTURE)
SUT SILK 3-0 18XBRD TIE 12 (SUTURE) IMPLANT
SUT VIC AB 3-0 SH 18 (SUTURE) ×5 IMPLANT
SYR BULB IRRIGATION 50ML (SYRINGE) ×2 IMPLANT
TOWEL OR 17X26 10 PK STRL BLUE (TOWEL DISPOSABLE) ×2 IMPLANT
TOWEL OR NON WOVEN STRL DISP B (DISPOSABLE) ×2 IMPLANT
YANKAUER SUCT BULB TIP 10FT TU (MISCELLANEOUS) ×2 IMPLANT

## 2017-08-04 NOTE — Anesthesia Preprocedure Evaluation (Addendum)
Anesthesia Evaluation  Patient identified by MRN, date of birth, ID band Patient awake    Reviewed: Allergy & Precautions, NPO status , Patient's Chart, lab work & pertinent test results  Airway Mallampati: II       Dental  (+) Edentulous Upper, Dental Advisory Given   Pulmonary Current Smoker,     + decreased breath sounds      Cardiovascular hypertension, Pt. on medications  Rhythm:Regular Rate:Normal     Neuro/Psych  Neuromuscular disease    GI/Hepatic negative GI ROS, Neg liver ROS,   Endo/Other  diabetes, Type 2, Insulin DependentHypothyroidism   Renal/GU      Musculoskeletal  (+) Fibromyalgia -  Abdominal (+) - obese,   Peds  Hematology negative hematology ROS (+)   Anesthesia Other Findings   Reproductive/Obstetrics                            Lab Results  Component Value Date   WBC 7.8 07/25/2017   HGB 9.2 (L) 07/25/2017   HCT 29.0 (L) 07/25/2017   MCV 79.7 07/25/2017   PLT 308 07/25/2017   Lab Results  Component Value Date   CREATININE 0.94 07/25/2017   BUN 19 07/25/2017   NA 139 07/25/2017   K 3.7 07/25/2017   CL 104 07/25/2017   CO2 28 07/25/2017   Lab Results  Component Value Date   INR 1.03 06/15/2016   EKG: sinus bradycardia, RBBB.  Anesthesia Physical Anesthesia Plan  ASA: II  Anesthesia Plan: General   Post-op Pain Management:    Induction: Intravenous  PONV Risk Score and Plan: 3 and Ondansetron, Dexamethasone and Treatment may vary due to age or medical condition  Airway Management Planned: Oral ETT  Additional Equipment: None  Intra-op Plan:   Post-operative Plan: Extubation in OR  Informed Consent: I have reviewed the patients History and Physical, chart, labs and discussed the procedure including the risks, benefits and alternatives for the proposed anesthesia with the patient or authorized representative who has indicated his/her  understanding and acceptance.   Dental advisory given  Plan Discussed with: CRNA  Anesthesia Plan Comments:         Anesthesia Quick Evaluation

## 2017-08-04 NOTE — Brief Op Note (Signed)
08/04/2017  3:38 PM  PATIENT:  Diane LundMartha V Knight  77 y.o. female  PRE-OPERATIVE DIAGNOSIS:  THYROID GOITER  POST-OPERATIVE DIAGNOSIS:  THYROID GOITER  PROCEDURE:  Procedure(s): TOTAL THYROIDECTOMY (N/A)  SURGEON:  Surgeon(s) and Role:    * Darnell LevelGerkin, Adrielle Polakowski, MD - Primary    * Ovidio KinNewman, David, MD - Assisting  ANESTHESIA:   general  EBL:  100 mL   BLOOD ADMINISTERED:none  DRAINS: none   LOCAL MEDICATIONS USED:  NONE  SPECIMEN:  Excision  DISPOSITION OF SPECIMEN:  PATHOLOGY  COUNTS:  YES  TOURNIQUET:  * No tourniquets in log *  DICTATION: .Other Dictation: Dictation Number [id number]  PLAN OF CARE: Admit to inpatient   PATIENT DISPOSITION:  PACU - hemodynamically stable.   Delay start of Pharmacological VTE agent (>24hrs) due to surgical blood loss or risk of bleeding: yes  Darnell Levelodd Indra Wolters, MD [facility] Office: [phone]

## 2017-08-04 NOTE — Interval H&P Note (Signed)
History and Physical Interval Note:  08/04/2017 10:55 AM  Diane Knight  has presented today for surgery, with the diagnosis of THYROID GOITER.  The various methods of treatment have been discussed with the patient and family. After consideration of risks, benefits and other options for treatment, the patient has consented to    Procedure(s): TOTAL THYROIDECTOMY (N/A) as a surgical intervention .    The patient's history has been reviewed, patient examined, no change in status, stable for surgery.  I have reviewed the patient's chart and labs.  Questions were answered to the patient's satisfaction.    Darnell Levelodd Missie Gehrig, MD [facility] Office: [phone]    Ansen Sayegh MJudie Petit

## 2017-08-04 NOTE — Interval H&P Note (Signed)
History and Physical Interval Note:  08/04/2017 12:39 PM  Diane Knight  has presented today for surgery, with the diagnosis of THYROID GOITER.  The various methods of treatment have been discussed with the patient and family. After consideration of risks, benefits and other options for treatment, the patient has consented to    Procedure(s): TOTAL THYROIDECTOMY (N/A) as a surgical intervention .    The patient's history has been reviewed, patient examined, no change in status, stable for surgery.  I have reviewed the patient's chart and labs.  Questions were answered to the patient's satisfaction.    Navneet Schmuck, MD [facility] Office: [phone]   [name]   

## 2017-08-04 NOTE — Interval H&P Note (Signed)
History and Physical Interval Note:  08/04/2017 12:39 PM  Diane LundMartha V Knight  has presented today for surgery, with the diagnosis of THYROID GOITER.  The various methods of treatment have been discussed with the patient and family. After consideration of risks, benefits and other options for treatment, the patient has consented to    Procedure(s): TOTAL THYROIDECTOMY (N/A) as a surgical intervention .    The patient's history has been reviewed, patient examined, no change in status, stable for surgery.  I have reviewed the patient's chart and labs.  Questions were answered to the patient's satisfaction.    Darnell Levelodd Aarik Blank, MD [facility] Office: [phone]   [name]

## 2017-08-04 NOTE — Op Note (Signed)
Procedure Note  Pre-operative Diagnosis:  Thyroid goiter with compressive symptoms, tracheal compression  Post-operative Diagnosis:  same  Surgeon:  Darnell Level, MD  Assistant:  Ovidio Kin, MD   Procedure:  Total thyroidectomy  Anesthesia:  General  Estimated Blood Loss:  minimal  Drains: none         Specimen: thyroid to pathology  Indications:  She was evaluated last fall for enlarging thyroid goiter with compressive symptoms. At my request the patient underwent a CT scan of the neck on April 22, 2017. This showed marked enlargement of the thyroid bilaterally. There was compression of the esophagus due to the goiter. Trachea was mildly flattened side to side but was not significantly narrowed. Right thyroid lobe measured 12 cm in greatest dimension and left lobe measured 13 cm in greatest dimension.   Procedure Details: Procedure was done in OR #1 at the [facility].  The patient was brought to the operating room and placed in a supine position on the operating room table.  Following administration of general anesthesia, the patient was positioned and then prepped and draped in the usual aseptic fashion.  After ascertaining that an adequate level of anesthesia had been achieved, a Kocher incision was made with #15 blade.  Dissection was carried through subcutaneous tissues and platysma. Hemostasis was achieved with the electrocautery.  Skin flaps were elevated cephalad and caudad from the thyroid notch to the sternal notch.  The Mahorner self-retaining retractor was placed for exposure.  Strap muscles were incised in the midline and dissection was begun on the left side.  Strap muscles were reflected laterally.  Left thyroid lobe was markedly enlarged and very firm.  The left lobe was gently mobilized with blunt dissection.  Superior pole vessels were dissected out and divided individually between small and medium Ligaclips with the Harmonic scalpel.  The thyroid lobe was  rolled anteriorly.  Branches of the inferior thyroid artery were divided between small Ligaclips with the Harmonic scalpel.  Inferior venous tributaries were divided between Ligaclips.  Both the superior and inferior parathyroid glands were identified and preserved on their vascular pedicles.  The recurrent laryngeal nerve was identified and preserved along its course.  The ligament of Allyson Sabal was released with the electrocautery and the gland was mobilized onto the anterior trachea. Isthmus was mobilized across the midline.  There was a large pyramidal lobe present which was dissected off of the thyroid cartilage up to the thyroid notch and resected.  The isthmus was transected at the junction with the right thyroid lobe using the Harmonic scalpel.  Dry pack was placed in the left neck.  Left thyroid lobe and isthmus were submitted to pathology as specimen #1.  Next, the right thyroid lobe was gently mobilized with blunt dissection.  Right thyroid lobe was markedly enlarged and very firm.  Superior pole vessels were dissected out and divided between small and medium Ligaclips with the Harmonic scalpel.  Superior parathyroid was identified and preserved.  Inferior venous tributaries were divided between medium Ligaclips with the Harmonic scalpel.  The right thyroid lobe was rolled anteriorly and the branches of the inferior thyroid artery divided between small Ligaclips.  The right recurrent laryngeal nerve was identified and preserved along its course.  The ligament of Allyson Sabal was released with the electrocautery.  The right thyroid lobe was mobilized onto the anterior trachea and the remainder of the thyroid was dissected off the anterior trachea and the thyroid was completely excised.  The right thyroid lobe  was submitted to pathology for review labelled as specimen #2.  The neck was irrigated with warm saline.  Powdered Surgicel was placed throughout the operative field.  Strap muscles were reapproximated in the  midline with interrupted 3-0 Vicryl sutures.  Platysma was closed with interrupted 3-0 Vicryl sutures.  Skin was closed with a running 4-0 Monocryl subcuticular suture.  Wound was washed and dried and steri-strips were applied.  Dry gauze dressing was placed.  The patient was awakened from anesthesia and brought to the recovery room.  The patient tolerated the procedure well.   Darnell Levelodd Koden Hunzeker, MD [facility] Office: [phone]

## 2017-08-04 NOTE — Anesthesia Procedure Notes (Signed)
Procedure Name: Intubation Date/Time: 08/04/2017 1:06 PM Performed by: Maxwell Caul, CRNA Pre-anesthesia Checklist: Patient identified, Emergency Drugs available, Suction available and Patient being monitored Patient Re-evaluated:Patient Re-evaluated prior to induction Oxygen Delivery Method: Circle system utilized Preoxygenation: Pre-oxygenation with 100% oxygen Induction Type: IV induction Ventilation: Mask ventilation without difficulty Laryngoscope Size: Mac and 4 Grade View: Grade II Tube type: Oral Tube size: 7.5 mm Number of attempts: 1 Airway Equipment and Method: Stylet Placement Confirmation: ETT inserted through vocal cords under direct vision,  positive ETCO2 and breath sounds checked- equal and bilateral Secured at: 21 cm Tube secured with: Tape Dental Injury: Teeth and Oropharynx as per pre-operative assessment

## 2017-08-04 NOTE — Transfer of Care (Signed)
Immediate Anesthesia Transfer of Care Note  Patient: Wilfrid LundMartha V Balinski  Procedure(s) Performed: TOTAL THYROIDECTOMY (N/A Neck)  Patient Location: PACU  Anesthesia Type:General  Level of Consciousness: awake, alert  and patient cooperative  Airway & Oxygen Therapy: Patient Spontanous Breathing and Patient connected to face mask oxygen  Post-op Assessment: Report given to RN and Post -op Vital signs reviewed and stable  Post vital signs: Reviewed and stable  Last Vitals:  Vitals:   08/04/17 1024  BP: (!) 154/72  Pulse: 71  Resp: 16  Temp: 36.9 C  SpO2: 99%    Last Pain:  Vitals:   08/04/17 1056  TempSrc:   PainSc: 5       Patients Stated Pain Goal: 4 (08/04/17 1056)  Complications: No apparent anesthesia complications

## 2017-08-04 NOTE — H&P (Signed)
General Surgery [facility]  Wilfrid LundMartha V Pytel DOB: 1940-09-21 Widowed / Language: [place]onds[place] / Race: Black or African American Female   History of Present Illness  The patient is a 77 year old female who presents with a complaint of Enlarged thyroid.  CC: Thyroid goiter with compression of esophagus  Patient returns for follow-up. She was evaluated last fall for enlarging thyroid goiter with compressive symptoms. At my request the patient underwent a CT scan of the neck on April 22, 2017. This showed marked enlargement of the thyroid bilaterally. There was compression of the esophagus due to the goiter. Trachea was mildly flattened side to side but was not significantly narrowed. Right thyroid lobe measured 12 cm in greatest dimension and left lobe measured 13 cm in greatest dimension. There was a cystic lesion in the right supraclavicular fossa either representing a lymphocele or possibly a necrotic lymph node. No other lymphadenopathy was identified. Patient returns today to review this report. She has had time to settle some of her personal affairs and is interested in proceeding with surgery in the near future. She does note continued solid food dysphagia. She denies any dyspnea. She does have some discomfort in the neck and upper extremities. She denies any hoarseness.   Allergies  MetFORMIN HCl *ANTIDIABETICS*  Allergies Reconciled   Medication History AmLODIPine Besylate (10MG  Tablet, Oral) Active. Aspirin (81MG  Tablet, Oral) Active. Atorvastatin Calcium (10MG  Tablet, Oral) Active. Valsartan (160MG  Capsule, Oral) Active. Levothyroxine Sodium (125MCG Tablet, Oral) Active. Lyrica (75MG  Capsule, Oral) Active. Medications Reconciled  Vitals  Weight: 165.8 lb Height: 66in Body Surface Area: 1.85 m Body Mass Index: 26.76 kg/m  Temp.: 98.22F  Pulse: 74 (Regular)  BP: 116/72 (Sitting, Left Arm, Standard)  Physical Exam  See  vital signs recorded above  GENERAL APPEARANCE Development: normal Nutritional status: normal Gross deformities: none  SKIN Rash, lesions, ulcers: none Induration, erythema: none Nodules: none palpable  EYES Conjunctiva and lids: normal Pupils: equal and reactive Iris: normal bilaterally  EARS, NOSE, MOUTH, THROAT External ears: no lesion or deformity External nose: no lesion or deformity Hearing: grossly normal Lips: no lesion or deformity Dentition: normal for age Oral mucosa: moist  NECK Symmetric: no Trachea: midline Thyroid: Markedly enlarged thyroid bilaterally, multinodular, firm, nontender. Measurements as noted. Voice quality is normal. No sign of stridor.  CHEST Respiratory effort: normal Retraction or accessory muscle use: no Breath sounds: normal bilaterally Rales, rhonchi, wheeze: none  CARDIOVASCULAR Auscultation: regular rhythm, normal rate Murmurs: none Pulses: carotid and radial pulse 2+ palpable Lower extremity edema: none Lower extremity varicosities: none  MUSCULOSKELETAL Station and gait: normal Digits and nails: no clubbing or cyanosis Muscle strength: grossly normal all extremities Range of motion: grossly normal all extremities Deformity: none  LYMPHATIC Cervical: none palpable Supraclavicular: none palpable  PSYCHIATRIC Oriented to person, place, and time: yes Mood and affect: normal for situation Judgment and insight: appropriate for situation    Assessment & Plan  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 We reviewed her CT scan results from October 2018. We discussed her clinical symptoms of compression.  Patient has an enlarged thyroid goiter with compression of the cervical esophagus. She has not had airway compromise. We discussed total thyroidectomy as the  treatment of choice. We discussed potential fits and risk of the procedure including the risk of injury to recurrent laryngeal nerves (5%) and potential injury to  parathyroid glands. We discussed the need for lifelong thyroid hormone replacement but the patient is already taking levothyroxine 50 g daily. We discussed the cystic lesion in the right neck and it will most likely be removed at the time of surgery for evaluation. We discussed the hospital stay to be anticipated. We discussed her postoperative recovery. Patient understands and wishes to proceed with surgery either in February or March of this year.  The risks and benefits of the procedure have been discussed at length with the patient. The patient understands the proposed procedure, potential alternative treatments, and the course of recovery to be expected. All of the patient's questions have been answered at this time. The patient wishes to proceed with surgery.  Darnell Level, MD [facility] Office: [phone]

## 2017-08-04 NOTE — Anesthesia Postprocedure Evaluation (Signed)
Anesthesia Post Note  Patient: Wilfrid LundMartha V Trainer  Procedure(s) Performed: TOTAL THYROIDECTOMY (N/A Neck)     Patient location during evaluation: PACU Anesthesia Type: General Level of consciousness: awake Pain management: pain level controlled Vital Signs Assessment: post-procedure vital signs reviewed and stable Respiratory status: spontaneous breathing Cardiovascular status: stable Anesthetic complications: no    Last Vitals:  Vitals:   08/04/17 1615 08/04/17 1640  BP: (!) 163/62 (!) 148/66  Pulse: 61 68  Resp: 13 16  Temp: 36.9 C 37 C  SpO2: 98% 100%    Last Pain:  Vitals:   08/04/17 1615  TempSrc:   PainSc: 10-Worst pain ever                 Kamiya Acord

## 2017-08-05 DIAGNOSIS — E063 Autoimmune thyroiditis: Secondary | ICD-10-CM | POA: Diagnosis not present

## 2017-08-05 LAB — BASIC METABOLIC PANEL
ANION GAP: 7 (ref 5–15)
Anion gap: 8 (ref 5–15)
BUN: 20 mg/dL (ref 6–20)
BUN: 21 mg/dL — AB (ref 6–20)
CALCIUM: 8.1 mg/dL — AB (ref 8.9–10.3)
CO2: 26 mmol/L (ref 22–32)
CO2: 28 mmol/L (ref 22–32)
CREATININE: 0.83 mg/dL (ref 0.44–1.00)
Calcium: 7.7 mg/dL — ABNORMAL LOW (ref 8.9–10.3)
Chloride: 99 mmol/L — ABNORMAL LOW (ref 101–111)
Chloride: 99 mmol/L — ABNORMAL LOW (ref 101–111)
Creatinine, Ser: 0.88 mg/dL (ref 0.44–1.00)
GFR calc Af Amer: >60 mL/min (ref >60)
GLUCOSE: 160 mg/dL — AB (ref 65–99)
Glucose, Bld: 202 mg/dL — ABNORMAL HIGH (ref 65–99)
POTASSIUM: 4.5 mmol/L (ref 3.5–5.1)
Potassium: 3.7 mmol/L (ref 3.5–5.1)
SODIUM: 133 mmol/L — AB (ref 135–145)
Sodium: 134 mmol/L — ABNORMAL LOW (ref 135–145)

## 2017-08-05 LAB — GLUCOSE, CAPILLARY
GLUCOSE-CAPILLARY: 180 mg/dL — AB (ref 65–99)
Glucose-Capillary: 120 mg/dL — ABNORMAL HIGH (ref 65–99)
Glucose-Capillary: 166 mg/dL — ABNORMAL HIGH (ref 65–99)
Glucose-Capillary: 153 mg/dL — ABNORMAL HIGH (ref 65–99)

## 2017-08-05 MED ORDER — CALCIUM CARBONATE 1250 (500 CA) MG PO TABS
2.0000 | ORAL_TABLET | Freq: Three times a day (TID) | ORAL | Status: DC
  Administered 2017-08-05 – 2017-08-06 (×5): 1000 mg via ORAL
  Filled 2017-08-05 (×8): qty 1

## 2017-08-05 MED ORDER — CALCITRIOL 0.25 MCG PO CAPS
0.2500 ug | ORAL_CAPSULE | Freq: Two times a day (BID) | ORAL | Status: DC
  Administered 2017-08-05 – 2017-08-06 (×3): 0.25 ug via ORAL
  Filled 2017-08-05 (×3): qty 1

## 2017-08-05 MED ORDER — SODIUM CHLORIDE 0.9 % IV SOLN
2.0000 g | INTRAVENOUS | Status: AC
  Administered 2017-08-05: 2 g via INTRAVENOUS
  Filled 2017-08-05: qty 20

## 2017-08-05 NOTE — Progress Notes (Signed)
  General Surgery [facility]  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 Solstice Lastinger, MD, L[facility] CenterFACS       [facility]       Office: [phone]    Chief Complaint: Thyroid goiter  Subjective: Patient up at bedside, ate all of breakfast, "hungry"  Objective: Vital signs in last 24 hours: Temp:  [97.7 F (36.5 C)-98.9 F (37.2 C)] 98.2 F (36.8 C) (02/05 0635) Pulse Rate:  [58-77] 71 (02/05 0635) Resp:  [7-18] 18 (02/05 0635) BP: (140-181)/(59-81) 158/73 (02/05 0635) SpO2:  [94 %-100 %] 100 % (02/05 0635) Weight:  [74.4 kg (164 lb)] 74.4 kg (164 lb) (02/04 1056) Last BM Date: 08/02/17  Intake/Output from previous day: 02/04 0701 - 02/05 0700 In: 2211.7 [P.O.:500; I.V.:1611.7; IV Piggyback:100] Out: 2400 [Urine:2300; Blood:100] Intake/Output this shift: No intake/output data recorded.  Physical Exam: HEENT - sclerae clear, mucous membranes moist Neck - wound dry and intact; moderate soft tissue swelling; voice hoarse; no stridor Chest - clear bilaterally Cor - RRR Ext - no edema, non-tender Neuro - alert & oriented, no focal deficits  Lab Results:  No results for input(s): WBC, HGB, HCT, PLT in the last 72 hours. BMET Recent Labs    08/05/17 0527  NA 133*  K 4.5  CL 99*  CO2 26  GLUCOSE 202*  BUN 20  CREATININE 0.83  CALCIUM 7.7*   PT/INR No results for input(s): LABPROT, INR in the last 72 hours. Comprehensive Metabolic Panel:    Component Value Date/Time   NA 133 (L) 08/05/2017 0527   NA 139 07/25/2017 1020   K 4.5 08/05/2017 0527   K 3.7 07/25/2017 1020   CL 99 (L) 08/05/2017 0527   CL 104 07/25/2017 1020   CO2 26 08/05/2017 0527   CO2 28 07/25/2017 1020   BUN 20 08/05/2017 0527   BUN 19 07/25/2017 1020   CREATININE 0.83 08/05/2017 0527   CREATININE 0.94 07/25/2017 1020   GLUCOSE 202 (H) 08/05/2017 0527   GLUCOSE 100 (H) 07/25/2017 1020   CALCIUM 7.7 (L) 08/05/2017 0527   CALCIUM 8.7 (L) 07/25/2017 1020   AST 15 06/15/2016 0237   AST 13 09/22/2014 1738   ALT 11 (L) 06/15/2016 0237   ALT 13 09/22/2014 1738   ALKPHOS 97 06/15/2016 0237   ALKPHOS 150 (H) 09/22/2014 1738   BILITOT 0.6 06/15/2016 0237   BILITOT 0.4 09/22/2014 1738   PROT 9.9 (H) 06/15/2016 0237   PROT 9.3 (H) 09/22/2014 1738   ALBUMIN 2.6 (L) 06/15/2016 0237   ALBUMIN 3.3 (L) 09/22/2014 1738    Studies/Results: No results found.    Diane Knight 08/05/2017  Patient ID: Diane Knight, female   DOB: [birth year], 77 y.o.   MRN: [id number]

## 2017-08-06 DIAGNOSIS — E063 Autoimmune thyroiditis: Secondary | ICD-10-CM | POA: Diagnosis not present

## 2017-08-06 LAB — BASIC METABOLIC PANEL
ANION GAP: 7 (ref 5–15)
BUN: 24 mg/dL — ABNORMAL HIGH (ref 6–20)
CO2: 27 mmol/L (ref 22–32)
Calcium: 7.9 mg/dL — ABNORMAL LOW (ref 8.9–10.3)
Chloride: 101 mmol/L (ref 101–111)
Creatinine, Ser: 0.85 mg/dL (ref 0.44–1.00)
GFR calc Af Amer: >60 mL/min (ref >60)
GLUCOSE: 132 mg/dL — AB (ref 65–99)
POTASSIUM: 3.6 mmol/L (ref 3.5–5.1)
SODIUM: 135 mmol/L (ref 135–145)

## 2017-08-06 LAB — GLUCOSE, CAPILLARY: Glucose-Capillary: 153 mg/dL — ABNORMAL HIGH (ref 65–99)

## 2017-08-06 MED ORDER — CALCIUM CARBONATE ANTACID 500 MG PO CHEW: 3.0000 | CHEWABLE_TABLET | Freq: Three times a day (TID) | ORAL | 1 refills | Status: AC

## 2017-08-06 MED ORDER — CALCITRIOL 0.25 MCG PO CAPS: 0.2500 ug | ORAL_CAPSULE | Freq: Two times a day (BID) | ORAL | 0 refills | Status: DC

## 2017-08-06 MED ORDER — TRAMADOL HCL 50 MG PO TABS: 50.0000 mg | ORAL_TABLET | Freq: Four times a day (QID) | ORAL | 0 refills | Status: DC | PRN

## 2017-08-06 MED ORDER — SODIUM CHLORIDE 0.9 % IV SOLN
2.0000 g | INTRAVENOUS | Status: AC
  Administered 2017-08-06: 2 g via INTRAVENOUS
  Filled 2017-08-06: qty 20

## 2017-08-06 NOTE — Progress Notes (Signed)
  General Surgery [facility]  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 Lennyn Gange, MD, [facility]ACS       [facility]       Office: [phone]    Chief Complaint: Thyroid goiter  Subjective: Patient up at bedside, eating well.  Wants to go home.  No paresthesias or spasms.  Objective: Vital signs in last 24 hours: Temp:  [98.1 F (36.7 C)-98.5 F (36.9 C)] 98.1 F (36.7 C) (02/06 0540) Pulse Rate:  [59-63] 63 (02/06 0540) Resp:  [18] 18 (02/06 0540) BP: (148-163)/(72) 148/72 (02/06 0540) SpO2:  [100 %] 100 % (02/06 0540) Last BM Date: 08/02/17  Intake/Output from previous day: 02/05 0701 - 02/06 0700 In: 2120 [P.O.:1570; I.V.:550] Out: 2550 [Urine:2550] Intake/Output this shift: No intake/output data recorded.  Physical Exam: HEENT - sclerae clear, mucous membranes moist Neck - wound dry and intact; moderate STS, voice hoarse, no stridor Chest - clear bilaterally Cor - RRR Ext - no edema, non-tender Neuro - alert & oriented, no focal deficits  Lab Results:  No results for input(s): WBC, HGB, HCT, PLT in the last 72 hours. BMET Recent Labs    08/05/17 1553 08/06/17 0513  NA 134* 135  K 3.7 3.6  CL 99* 101  CO2 28 27  GLUCOSE 160* 132*  BUN 21* 24*  CREATININE 0.88 0.85  CALCIUM 8.1* 7.9*   PT/INR No results for input(s): LABPROT, INR in the last 72 hours. Comprehensive Metabolic Panel:    Component Value Date/Time   NA 135 08/06/2017 0513   NA 134 (L) 08/05/2017 1553   K 3.6 08/06/2017 0513   K 3.7 08/05/2017 1553   CL 101 08/06/2017 0513   CL 99 (L) 08/05/2017 1553   CO2 27 08/06/2017 0513   CO2 28 08/05/2017 1553   BUN  24 (H) 08/06/2017 0513   BUN 21 (H) 08/05/2017 1553   CREATININE 0.85 08/06/2017 0513   CREATININE 0.88 08/05/2017 1553   GLUCOSE 132 (H) 08/06/2017 0513   GLUCOSE 160 (H) 08/05/2017 1553   CALCIUM 7.9 (L) 08/06/2017 0513   CALCIUM 8.1 (L) 08/05/2017 1553   AST 15 06/15/2016 0237   AST 13 09/22/2014 1738   ALT 11 (L) 06/15/2016 0237   ALT 13 09/22/2014 1738   ALKPHOS 97 06/15/2016 0237   ALKPHOS 150 (H) 09/22/2014 1738   BILITOT 0.6 06/15/2016 0237   BILITOT 0.4 09/22/2014 1738   PROT 9.9 (H) 06/15/2016 0237   PROT 9.3 (H) 09/22/2014 1738   ALBUMIN 2.6 (L) 06/15/2016 0237   ALBUMIN 3.3 (L) 09/22/2014 1738    Studies/Results: No results found.    Ariyanah Aguado M 08/06/2017  Patient ID: Wilfrid LundMartha V Abreu, female   DOB: 19-Dec-1940, 77 y.o.   MRN: [id number]

## 2017-08-06 NOTE — Progress Notes (Signed)
Paged Dr Johna SheriffHoxworth to notify of increase of swelling and puffiness under the skin below the incision. Direction given to ensure [facility] is elevated and ice applied and to notify him if it worsens. Pt states she does not feel a difference and there is no trouble breathing or swallowing.

## 2017-08-06 NOTE — Care Management Obs Status (Signed)
MEDICARE OBSERVATION STATUS NOTIFICATION   Patient Details  Name: Diane LundMartha V Diamant MRN: [id number] Date of Birth: June 16, 1941   Medicare Observation Status Notification Given:  Yes    Alexis Goodelleele, Sivan Quast K, RN 08/06/2017, 11:26 AM

## 2017-08-06 NOTE — Discharge Summary (Signed)
Physician Discharge Summary [facility]  Patient ID: Diane LundMartha V Knight MRN: [id number] DOB/AGE: [birth year] 77 y.o.  Admit date: 08/04/2017 Discharge date: 08/06/2017  Admission Diagnoses:  Thyroid goiter with tracheal compression  Discharge Diagnoses:  Principal Problem:   Thyroid goiter   Discharged Condition: good  Hospital Course: Patient was admitted for observation following thyroid surgery.  Post op course was uncomplicated.  Pain was well controlled.  Tolerated diet.  Post op calcium level on morning following surgery was 8.1 mg/dl.  Patient was given IV calcium gluconate on POD#1 and #2 as well as supplemented with oral calcium and calcitriol.  Patient was prepared for discharge home on POD#2.  Consults: None  Treatments: surgery: total thyroidectomy  Discharge Exam: Blood pressure (!) 148/72, pulse 63, temperature 98.1 F (36.7 C), temperature source Oral, resp. rate 18, height 5\' 6"  (1.676 m), weight 74.4 kg (164 lb), SpO2 100 %. HEENT - clear Neck - wound dry and intact; moderate STS, seroma formation; voice hoarse but no stridor Chest - clear bilaterally Cor - RRR  Disposition: Home  Discharge Instructions    Diet - low sodium heart healthy   Complete by:  As directed    Discharge instructions   Complete by:  As directed    [facility]  THYROID & PARATHYROID SURGERY:  POST-OP INSTRUCTIONS  Always review your discharge instruction sheet from the facility where your surgery was performed.  A prescription for pain medication may be given to you upon discharge.  Take your pain medication as prescribed.  If narcotic pain medicine is not needed, then you may take acetaminophen (Tylenol) or ibuprofen (Advil) as needed.  Take your usually prescribed medications unless otherwise directed.  If you need a refill on your pain medication, please contact our office during regular business hours.  Prescriptions will not be processed by our  office after 5 pm or on weekends.  Start with a light diet upon arrival home, such as soup and crackers or toast.  Be sure to drink plenty of fluids daily.  Resume your normal diet the day after surgery.  Most patients will experience some swelling and bruising on the chest and neck area.  Ice packs will help.  Swelling and bruising can take several days to resolve.   It is common to experience some constipation after surgery.  Increasing fluid intake and taking a stool softener (Colace) will usually help or prevent this problem.  A mild laxative (Milk of Magnesia or Miralax) should be taken according to package directions if there has been no bowel movement after 48 hours.  You have steri-strips and a gauze dressing over your incision.  You may remove the gauze bandage on the second day after surgery, and you may shower at that time.  Leave your steri-strips (small skin tapes) in place directly over the incision.  These strips should remain on the skin for 5-7 days and then be removed.  You may get them wet in the shower and pat them dry.  You may resume regular (light) daily activities beginning the next day - such as daily self-care, walking, climbing stairs - gradually increasing activities as tolerated.  You may have sexual intercourse when it is comfortable.  Refrain from any heavy lifting or straining until approved by your doctor.  You may drive when you no longer are taking prescription pain medication, you can comfortably wear a seatbelt, and you can safely maneuver your car and apply brakes.  You should  see your doctor in the office for a follow-up appointment approximately three weeks after your surgery.  Make sure that you call for this appointment within a day or two after you arrive home to insure a convenient appointment time.  WHEN TO CALL YOUR DOCTOR: -- Fever greater than 101.5 -- Inability to urinate -- Nausea and/or vomiting - persistent -- Extreme swelling or bruising --  Continued bleeding from incision -- Increased pain, redness, or drainage from the incision -- Difficulty swallowing or breathing -- Muscle cramping or spasms -- Numbness or tingling in hands or around lips  The clinic staff is available to answer your questions during regular business hours.  Please don't hesitate to call and ask to speak to one of the nurses if you have concerns.  Velora Heckler, MD, FACS General & Endocrine Surgery [facility] Office: [phone]  Website: [email or web address]   Increase activity slowly   Complete by:  As directed    No dressing needed   Complete by:  As directed      Allergies as of 08/06/2017      Reactions   Metformin And Related Other (See Comments)   GI SIDE EFFECTS      Medication List    TAKE these medications   acetaminophen 500 MG tablet Commonly known as:  TYLENOL Take 500 mg by mouth every 6 (six) hours as needed for mild pain or headache.   amLODipine 10 MG tablet Commonly known as:  NORVASC Take 10 mg by mouth daily.   aspirin EC 81 MG tablet Take 81 mg by mouth daily.   atorvastatin 10 MG tablet Commonly known as:  LIPITOR Take 10 mg by mouth daily.   calcitRIOL 0.25 MCG capsule Commonly known as:  ROCALTROL Take 1 capsule (0.25 mcg total) by mouth 2 (two) times daily.   calcium carbonate 500 MG chewable tablet Commonly known as:  TUMS Chew 3 tablets (600 mg of elemental calcium total) by mouth 4 (four) times daily -  before meals and at bedtime.   insulin NPH-regular Human (70-30) 100 UNIT/ML injection Commonly known as:  NOVOLIN 70/30 Inject 24 Units into the skin 2 (two) times daily with a meal.   levothyroxine 100 MCG tablet Commonly known as:  SYNTHROID, LEVOTHROID Take 100 mcg by mouth daily before breakfast.   meclizine 25 MG tablet Commonly known as:  ANTIVERT Take 1 tablet (25 mg total) by mouth 3 (three) times daily as needed for dizziness.   methocarbamol 500 MG  tablet Commonly known as:  ROBAXIN Take 1 tablet (500 mg total) by mouth 2 (two) times daily.   naproxen sodium 220 MG tablet Commonly known as:  ALEVE Take 220 mg by mouth.   nortriptyline 25 MG capsule Commonly known as:  PAMELOR TAKE 1 CAPSULE BY MOUTH AT BEDTIME   ondansetron 4 MG tablet Commonly known as:  ZOFRAN Take 1 tablet (4 mg total) by mouth every 6 (six) hours.   traMADol 50 MG tablet Commonly known as:  ULTRAM Take 1-2 tablets (50-100 mg total) by mouth every 6 (six) hours as needed for moderate pain (mild pain not relieved by tylenol).   valsartan-hydrochlorothiazide 160-12.5 MG tablet Commonly known as:  DIOVAN-HCT Take 1 tablet by mouth daily.      Follow-up Information    Darnell Level, MD. Schedule an appointment as soon as possible for a visit in 3 week(s).   Specialty:  General Surgery Contact information: [address] [place]  [address] [phone]           Velora Heckler, MD, [facility] Office: [phone]   Signed: Velora Heckler 08/06/2017, 8:12 AM

## 2017-08-07 NOTE — Progress Notes (Signed)
Please contact patient and notify of benign pathology results.  Davarious Tumbleson M. Pa Tennant, MD, FACS [facility] Office: [phone]   

## 2018-01-27 ENCOUNTER — Other Ambulatory Visit: Payer: Self-pay | Admitting: Internal Medicine

## 2018-01-27 DIAGNOSIS — Z1231 Encounter for screening mammogram for malignant neoplasm of breast: Secondary | ICD-10-CM

## 2018-02-16 ENCOUNTER — Ambulatory Visit: Payer: Medicare Other

## 2018-02-17 ENCOUNTER — Ambulatory Visit
Admission: RE | Admit: 2018-02-17 | Discharge: 2018-02-17 | Disposition: A | Discharge status: Home / Self Care | Payer: Medicare Other | Source: Ambulatory Visit | Attending: Internal Medicine | Admitting: Internal Medicine

## 2018-02-17 ENCOUNTER — Other Ambulatory Visit: Payer: Self-pay | Admitting: Internal Medicine

## 2018-02-17 DIAGNOSIS — M545 Low back pain: Secondary | ICD-10-CM

## 2018-03-16 ENCOUNTER — Ambulatory Visit
Admission: RE | Admit: 2018-03-16 | Discharge: 2018-03-16 | Disposition: A | Discharge status: Home / Self Care | Payer: Medicare Other | Source: Ambulatory Visit | Attending: Internal Medicine | Admitting: Internal Medicine

## 2018-03-16 DIAGNOSIS — Z1231 Encounter for screening mammogram for malignant neoplasm of breast: Secondary | ICD-10-CM

## 2018-04-25 ENCOUNTER — Ambulatory Visit (INDEPENDENT_AMBULATORY_CARE_PROVIDER_SITE_OTHER)
Admission: EM | Admit: 2018-04-25 | Discharge: 2018-04-25 | Disposition: A | Discharge status: Other Acute Inpatient Hospital | Payer: Medicare Other | Source: Home / Self Care | Attending: Family Medicine | Admitting: Family Medicine

## 2018-04-25 ENCOUNTER — Encounter (HOSPITAL_COMMUNITY): Payer: Self-pay

## 2018-04-25 ENCOUNTER — Emergency Department (HOSPITAL_COMMUNITY): Payer: Medicare Other

## 2018-04-25 ENCOUNTER — Encounter (HOSPITAL_COMMUNITY): Payer: Self-pay | Admitting: Emergency Medicine

## 2018-04-25 ENCOUNTER — Emergency Department (HOSPITAL_COMMUNITY)
Admission: EM | Admit: 2018-04-25 | Discharge: 2018-04-26 | Disposition: A | Discharge status: Home / Self Care | Payer: Medicare Other | Attending: Emergency Medicine | Admitting: Emergency Medicine

## 2018-04-25 DIAGNOSIS — R51 Headache: Secondary | ICD-10-CM

## 2018-04-25 DIAGNOSIS — E1165 Type 2 diabetes mellitus with hyperglycemia: Secondary | ICD-10-CM | POA: Diagnosis not present

## 2018-04-25 DIAGNOSIS — N289 Disorder of kidney and ureter, unspecified: Secondary | ICD-10-CM

## 2018-04-25 DIAGNOSIS — R110 Nausea: Secondary | ICD-10-CM

## 2018-04-25 DIAGNOSIS — E119 Type 2 diabetes mellitus without complications: Secondary | ICD-10-CM | POA: Diagnosis not present

## 2018-04-25 DIAGNOSIS — Z794 Long term (current) use of insulin: Secondary | ICD-10-CM | POA: Insufficient documentation

## 2018-04-25 DIAGNOSIS — F17210 Nicotine dependence, cigarettes, uncomplicated: Secondary | ICD-10-CM | POA: Diagnosis not present

## 2018-04-25 DIAGNOSIS — Z79899 Other long term (current) drug therapy: Secondary | ICD-10-CM | POA: Diagnosis not present

## 2018-04-25 DIAGNOSIS — R001 Bradycardia, unspecified: Secondary | ICD-10-CM

## 2018-04-25 DIAGNOSIS — I10 Essential (primary) hypertension: Secondary | ICD-10-CM | POA: Diagnosis not present

## 2018-04-25 DIAGNOSIS — R519 Headache, unspecified: Secondary | ICD-10-CM

## 2018-04-25 DIAGNOSIS — Z7982 Long term (current) use of aspirin: Secondary | ICD-10-CM | POA: Diagnosis not present

## 2018-04-25 LAB — CBC
HEMATOCRIT: 38.1 % (ref 36.0–46.0)
Hemoglobin: 11.5 g/dL — ABNORMAL LOW (ref 12.0–15.0)
MCH: 26 pg (ref 26.0–34.0)
MCHC: 30.2 g/dL (ref 30.0–36.0)
MCV: 86 fL (ref 80.0–100.0)
Platelets: 302 10*3/uL (ref 150–400)
RBC: 4.43 MIL/uL (ref 3.87–5.11)
RDW: 17 % — AB (ref 11.5–15.5)
WBC: 6.1 10*3/uL (ref 4.0–10.5)
nRBC: 0 % (ref 0.0–0.2)

## 2018-04-25 LAB — BASIC METABOLIC PANEL
Anion gap: 8 (ref 5–15)
BUN: 15 mg/dL (ref 8–23)
CHLORIDE: 104 mmol/L (ref 98–111)
CO2: 28 mmol/L (ref 22–32)
Calcium: 8.6 mg/dL — ABNORMAL LOW (ref 8.9–10.3)
Creatinine, Ser: 1.1 mg/dL — ABNORMAL HIGH (ref 0.44–1.00)
GFR calc Af Amer: 55 mL/min — ABNORMAL LOW (ref >60)
GFR calc non Af Amer: 47 mL/min — ABNORMAL LOW (ref >60)
GLUCOSE: 93 mg/dL (ref 70–99)
POTASSIUM: 3.3 mmol/L — AB (ref 3.5–5.1)
Sodium: 140 mmol/L (ref 135–145)

## 2018-04-25 LAB — POCT I-STAT, CHEM 8
BUN: 17 mg/dL (ref 8–23)
CHLORIDE: 103 mmol/L (ref 98–111)
CREATININE: 1.2 mg/dL — AB (ref 0.44–1.00)
Calcium, Ion: 1.07 mmol/L — ABNORMAL LOW (ref 1.15–1.40)
GLUCOSE: 96 mg/dL (ref 70–99)
HCT: 37 % (ref 36.0–46.0)
Hemoglobin: 12.6 g/dL (ref 12.0–15.0)
Potassium: 3.4 mmol/L — ABNORMAL LOW (ref 3.5–5.1)
Sodium: 144 mmol/L (ref 135–145)
TCO2: 30 mmol/L (ref 22–32)

## 2018-04-25 LAB — I-STAT TROPONIN, ED: Troponin i, poc: 0 ng/mL (ref 0.00–0.08)

## 2018-04-25 NOTE — ED Provider Notes (Signed)
[facility] EMERGENCY DEPARTMENT Provider Note   CSN: [id number] Arrival date & time: 04/25/18  1806     History   Chief Complaint Chief Complaint  Patient presents with  . Headache  . Hypertension  . Hyperglycemia    HPI Diane Knight is a 77 y.o. female.  The history is provided by the patient and medical records.  Headache    Hypertension  Associated symptoms include headaches.  Hyperglycemia    77 y.o. F with hx of DM2, fibromyalgia, goiter, HLP, HTN, neuropathy, presenting to the ED for headache and HTN.  Patient reports she has been having headaches for about 2 weeks, states pain moves around her head but is present nearly every day.  States it feels like a throbbing pain.  She denies any focal numbness, weakness, dizziness, or confusion.  Does report some tingling of her fingers which is not unusual for her due to neuropathy.  She has had a little bit of blurred vision that is somewhat abnormal for her, denies any complete loss of vision..  She has not had any chest pain or shortness of breath.  She is not currently on them.  States she went to urgent care today and was told her blood pressure was very high.  States she has been compliant with her medications.  She attributes elevated pressures due to "too much food" lately.  Reports in the past month she had a wedding, funeral, and graduation party so she has been eating lots of salty and sugary foods.  States her blood pressure does usually run somewhat on the higher side but unsure what her usual numbers are.  Past Medical History:  Diagnosis Date  . Cataract   . Diabetes mellitus    TYPE 2   . Fibromyalgia   . Goiter   . Hyperlipidemia   . Hypertension   . Neuropathy   . Tobacco dependence     Patient Active Problem List   Diagnosis Date Noted  . Thyroid goiter 08/03/2017  . DM (diabetes mellitus) (HCC) 12/03/2012  . Peripheral neuropathy 12/03/2012  . Fibromyalgia     Past Surgical  History:  Procedure Laterality Date  . ABDOMINAL HYSTERECTOMY    . EYE SURGERY    . THYROIDECTOMY N/A 08/04/2017   Procedure: TOTAL THYROIDECTOMY;  Surgeon: Darnell Level, MD;  Location: [facility];  Service: General;  Laterality: N/A;     OB History   None      Home Medications    Prior to Admission medications   Medication Sig Start Date End Date Taking? Authorizing Provider  acetaminophen (TYLENOL) 500 MG tablet Take 500 mg by mouth every 6 (six) hours as needed for mild pain or headache.     [provider]  amLODipine (NORVASC) 10 MG tablet Take 10 mg by mouth daily.    [provider]  aspirin EC 81 MG tablet Take 81 mg by mouth daily.    [provider]  atorvastatin (LIPITOR) 10 MG tablet Take 10 mg by mouth daily.    [provider]  calcitRIOL (ROCALTROL) 0.25 MCG capsule Take 1 capsule (0.25 mcg total) by mouth 2 (two) times daily. 08/06/17   Darnell Level, MD  calcium carbonate (TUMS) 500 MG chewable tablet Chew 3 tablets (600 mg of elemental calcium total) by mouth 4 (four) times daily -  before meals and at bedtime. 08/06/17   Darnell Level, MD  insulin NPH-regular Human (NOVOLIN 70/30) (70-30) 100 UNIT/ML injection Inject 24  Units into the skin 2 (two) times daily with a meal.     [provider]  levothyroxine (SYNTHROID, LEVOTHROID) 100 MCG tablet Take 100 mcg by mouth daily before breakfast.     [provider]  meclizine (ANTIVERT) 25 MG tablet Take 1 tablet (25 mg total) by mouth 3 (three) times daily as needed for dizziness. Patient not taking: Reported on 04/26/2017 06/15/16   Rancour, Jeannett Senior, MD  methocarbamol (ROBAXIN) 500 MG tablet Take 1 tablet (500 mg total) by mouth 2 (two) times daily. Patient not taking: Reported on 06/15/2016 10/25/13   Piepenbrink, Victorino Dike, PA-C  naproxen sodium (ALEVE) 220 MG tablet Take 220 mg by mouth.    [provider]  nortriptyline (PAMELOR) 25 MG capsule TAKE 1 CAPSULE BY MOUTH  AT BEDTIME Patient not taking: Reported on 06/15/2016 02/09/14   Levert Feinstein, MD  ondansetron (ZOFRAN) 4 MG tablet Take 1 tablet (4 mg total) by mouth every 6 (six) hours. Patient not taking: Reported on 04/26/2017 06/15/16   Glynn Octave, MD  traMADol (ULTRAM) 50 MG tablet Take 1-2 tablets (50-100 mg total) by mouth every 6 (six) hours as needed for moderate pain (mild pain not relieved by tylenol). 08/06/17   Darnell Level, MD  valsartan-hydrochlorothiazide (DIOVAN-HCT) 160-12.5 MG per tablet Take 1 tablet by mouth daily.    [provider]    Family History Family History  Problem Relation Age of Onset  . Diabetes Mother   . High blood pressure Father     Social History Social History   Tobacco Use  . Smoking status: Current Every Day Smoker    Packs/day: 0.30    Types: Cigarettes  . Smokeless tobacco: Never Used  Substance Use Topics  . Alcohol use: No  . Drug use: No     Allergies   Metformin and related   Review of Systems Review of Systems  Neurological: Positive for headaches.  All other systems reviewed and are negative.    Physical Exam Updated Vital Signs BP (!) 214/74 (BP Location: Right Arm)   Pulse (!) 54   Temp 98.9 F (37.2 C) (Oral)   Resp 18   Ht 5\' 6"  (1.676 m)   Wt 90.7 kg   SpO2 97%   BMI 32.28 kg/m   Physical Exam  Constitutional: She is oriented to person, place, and time. She appears well-developed and well-nourished. No distress.  HENT:  Head: Normocephalic and atraumatic.  Right Ear: External ear normal.  Left Ear: External ear normal.  Mouth/Throat: Oropharynx is clear and moist.  PERRL  Eyes: Pupils are equal, round, and reactive to light. Conjunctivae and EOM are normal.  Neck: Normal range of motion and full passive range of motion without pain. Neck supple. No neck rigidity.  No rigidity, no meningismus  Cardiovascular: Normal rate, regular rhythm and normal heart sounds.  No murmur heard. Pulmonary/Chest: Effort  normal and breath sounds normal. No respiratory distress. She has no wheezes. She has no rhonchi.  Abdominal: Soft. Bowel sounds are normal. There is no tenderness. There is no guarding.  Musculoskeletal: Normal range of motion. She exhibits no edema.  Neurological: She is alert and oriented to person, place, and time. She has normal strength. She displays no tremor. No cranial nerve deficit or sensory deficit. She displays no seizure activity.  AAOx3, answering questions and following commands appropriately; equal strength UE and LE bilaterally; CN grossly intact; moves all extremities appropriately without ataxia; no focal neuro deficits or facial asymmetry appreciated, ambulatory  independently with steady gait  Skin: Skin is warm and dry. No rash noted. She is not diaphoretic.  Psychiatric: She has a normal mood and affect. Her behavior is normal. Thought content normal.  Nursing note and vitals reviewed.    ED Treatments / Results  Labs (all labs ordered are listed, but only abnormal results are displayed) Labs Reviewed  BASIC METABOLIC PANEL - Abnormal; Notable for the following components:      Result Value   Potassium 3.3 (*)    Creatinine, Ser 1.10 (*)    Calcium 8.6 (*)    GFR calc non Af Amer 47 (*)    GFR calc Af Amer 55 (*)    All other components within normal limits  CBC - Abnormal; Notable for the following components:   Hemoglobin 11.5 (*)    RDW 17.0 (*)    All other components within normal limits  URINALYSIS, ROUTINE W REFLEX MICROSCOPIC - Abnormal; Notable for the following components:   Protein, ur 100 (*)    All other components within normal limits  CBG MONITORING, ED  I-STAT TROPONIN, ED    EKG ED ECG REPORT   Date: 04/26/2018  Rate: 53  Rhythm: normal sinus rhythm  QRS Axis: normal  Intervals: normal  ST/T Wave abnormalities: normal  Conduction Disutrbances:right bundle branch block  Narrative Interpretation:   Old EKG Reviewed: unchanged  I  have personally reviewed the EKG tracing and agree with the computerized printout as noted.   Radiology Dg Chest 2 View  Result Date: 04/26/2018 CLINICAL DATA:  77 year old female with headache and hypertension. EXAM: CHEST - 2 VIEW COMPARISON:  Chest radiograph dated 07/25/2017 FINDINGS: There is cardiomegaly. No focal consolidation, pleural effusion, or pneumothorax. Postsurgical changes in the region of the thyroid gland. No acute osseous pathology. IMPRESSION: 1. No acute cardiopulmonary process. 2. Cardiomegaly. Electronically Signed   By: Elgie Collard M.D.   On: 04/26/2018 00:34   Ct Head Wo Contrast  Result Date: 04/26/2018 CLINICAL DATA:  Headache for 2 weeks EXAM: CT HEAD WITHOUT CONTRAST TECHNIQUE: Contiguous axial images were obtained from the base of the skull through the vertex without intravenous contrast. COMPARISON:  Head CT and brain MRI June 15, 2016 FINDINGS: Brain: There is age related volume loss. There is no intracranial mass, hemorrhage, extra-axial fluid collection, or midline shift. There is patchy small vessel disease throughout the centra semiovale bilaterally. No acute infarct is evident. Vascular: No hyperdense vessel. There is calcification in each carotid siphon region. Skull: The bony calvarium appears intact. Sinuses/Orbits: There is mucosal thickening in several ethmoid air cells. Other paranasal sinuses are clear. Orbits appear symmetric bilaterally. Other: Mastoid air cells on the left are clear. There is opacification of multiple mastoid air cells on the right. IMPRESSION: Age related volume loss with patchy periventricular small vessel disease. No acute infarct evident. No mass or hemorrhage. Foci of arterial vascular calcification noted. Mucosal thickening noted in several ethmoid air cells. 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 Electronically Signed   By: Bretta Bang III M.D.   On: 04/26/2018 00:59    Procedures Procedures (including  critical care time)  Medications Ordered in ED Medications - No data to display   Initial Impression / Assessment and Plan / ED Course  I have reviewed the triage vital signs and the nursing notes.  Pertinent labs & imaging results that were available during my care of the patient were reviewed by me and considered in my medical decision making (  see chart for details).  77 year old female presented to the ED with elevated blood pressure and headache.  States she is been having headaches for about 2 weeks, seems to migrate around her head.  Does report some blurred vision but no new numbness, weakness, dizziness, changes in speech, difficulty walking.  Seen in urgent care today and sent here due to elevated blood pressure in the 200s systolic.  States she is not sure what her normal blood pressure is.  She reports has been compliant with her blood pressure medications.  She is awake, alert, fully oriented.  Neurologic exam is nonfocal.  She is ambulatory with steady gait.  Screening labs overall reassuring.  No evidence of endorgan damage.  Given elevated pressures in complaint of headache, head CT was obtained which is negative for any acute intracranial findings.  Chest x-ray is clear.  Patient was treated here with dose of clonidine with good improvement of her blood pressure to 166/53.  Patient remains neurologically intact.  She does report being under a lot of stress and eating poorly over the past several weeks which may have been contributing.  Given good improvement of her pressures and no evidence of endorgan damage, feel she is stable for discharge home.  I recommended that she monitor her blood pressure closely at home, modify diet.  Follow-up closely with PCP for recheck within the next week.  She will return here for any new or worsening symptoms.  Patient seen and evaluated with attending physician, Dr. Fredderick Phenix, who agrees with assessment and plan of care.  Final Clinical Impressions(s) /  ED Diagnoses   Final diagnoses:  Essential hypertension  Bad headache    ED Discharge Orders    None       Garlon Hatchet, PA-C 04/26/18 1610    Rolan Bucco, MD 04/26/18 [phone]

## 2018-04-25 NOTE — ED Triage Notes (Signed)
Pt presents with headache x 2 wks and hypertension, states she takes antihypertensives; also having high CBG and takes insulin, unable to check CBG at home d/t malfunctions

## 2018-04-25 NOTE — ED Provider Notes (Signed)
[facility] CARE CENTER   [id number] 04/25/18 Arrival Time: 1656  ASSESSMENT & PLAN:  1. Uncontrolled hypertension   2. Acute nonintractable headache, unspecified headache type   3. Acute renal insufficiency   4. Nausea without vomiting   5. Bradycardia    No h/o kidney disease known/reported. Likely pre-renal.  Labs Reviewed  POCT I-STAT, CHEM 8 - Abnormal; Notable for the following components:      Result Value   Potassium 3.4 (*)    Creatinine, Ser 1.20 (*)    Calcium, Ion 1.07 (*)    All other components within normal limits   Follow-up Information    Go to  [facility] EMERGENCY DEPARTMENT.   Specialty:  Emergency Medicine Contact information: [address] [id number] [place] Bon[place] [address] [phone]         Discussed need for more acute evaluation in the ED this evening. Patient verbalized understanding. After Visit Summary given.   SUBJECTIVE: History from: patient. Overall, poor historian. Diane Knight is a 77 y.o. female who presents with complaint of a fairly persistent headache for the past couple of weeks, worse over the past few days. She has difficulty describing her symptoms. Headache is throbbing. Transient, not daily. Can last a full day though. Does not wake her from sleep. No lightheadedness or vertigo. Occasional "blurry vision"; none currently. No extremity sensation changes or weakness except for very rare "tingling in my fingers"; none currently. Current HA not the worst HA of her life. Does reports recent life stressors. "Not eating well." No anxiety. Reports taking her medications as prescribed. Does experience headaches with elevated BP in the past.  Diabetic. Unable to get her monitor to work the past several days. Feels blood sugars are usually controlled. Occasional blurry vision she describes is not worse over the past week. No significant increased thirst or urinary frequency. Normal bowel/bladder  habits. No confusion. No recent illnesses.  Persistent nausea for a few days. No emesis. "Feel like I want to throw up but I don't." No specific abdominal pain. No urinary symptoms. Afebrile. No flank pain. No CP/SOB reported or associated. No palpitations.  Does report overall fatigue over the past month.  Social History   Tobacco Use  Smoking Status Current Every Day Smoker  . Packs/day: 0.30  . Types: Cigarettes  Smokeless Tobacco Never Used    ROS: As per HPI. All other systems negative.    OBJECTIVE:  Vitals:   04/25/18 1722  BP: (!) 190/83  Pulse: (!) 50  Resp: 20  Temp: (!) 97.2 F (36.2 C)  TempSrc: Oral  SpO2: 98%    Elevated BP noted.  General appearance: alert; no distress Eyes: PERRLA; EOMI; conjunctiva normal HENT: normocephalic; atraumatic; oral mucosa moist Neck: supple; FROM; trachea midline Lungs: clear to auscultation bilaterally; unlabored Heart: bradycardia; regular Abdomen: soft, non-tender; bowel sounds normal Back: no CVA tenderness Extremities: no edema; symmetrical with no gross deformities Skin: warm and dry Neurologic: normal gait; normal symmetric reflexes and strength of all extremities Psychological: alert and cooperative; normal mood and affect  Labs: Results for orders placed or performed during the hospital encounter of 04/25/18  I-STAT, chem 8  Result Value Ref Range   Sodium 144 135 - 145 mmol/L   Potassium 3.4 (L) 3.5 - 5.1 mmol/L   Chloride 103 98 - 111 mmol/L   BUN 17 8 - 23 mg/dL   Creatinine, Ser 6.29 (H) 0.44 - 1.00 mg/dL   Glucose, Bld 96 70 -  99 mg/dL   Calcium, Ion 1.61 (L) 1.15 - 1.40 mmol/L   TCO2 30 22 - 32 mmol/L   Hemoglobin 12.6 12.0 - 15.0 g/dL   HCT 09.6 04.5 - 40.9 %   Labs Reviewed  POCT I-STAT, CHEM 8 - Abnormal; Notable for the following components:      Result Value   Potassium 3.4 (*)    Creatinine, Ser 1.20 (*)    Calcium, Ion 1.07 (*)    All other components within normal limits     Allergies  Allergen Reactions  . Metformin And Related Other (See Comments)    GI SIDE EFFECTS    Past Medical History:  Diagnosis Date  . Cataract   . Diabetes mellitus    TYPE 2   . Fibromyalgia   . Goiter   . Hyperlipidemia   . Hypertension   . Neuropathy   . Tobacco dependence    Social History   Socioeconomic History  . Marital status: Widowed    Spouse name: Not on file  . Number of children: 6  . Years of education: 10th  . Highest education level: Not on file  Occupational History    Employer: RETIRED    Comment: retired  Engineer, production  . Financial resource strain: Not on file  . Food insecurity:    Worry: Not on file    Inability: Not on file  . Transportation needs:    Medical: Not on file    Non-medical: Not on file  Tobacco Use  . Smoking status: Current Every Day Smoker    Packs/day: 0.30    Types: Cigarettes  . Smokeless tobacco: Never Used  Substance and Sexual Activity  . Alcohol use: No  . Drug use: No  . Sexual activity: Never  Lifestyle  . Physical activity:    Days per week: Not on file    Minutes per session: Not on file  . Stress: Not on file  Relationships  . Social connections:    Talks on phone: Not on file    Gets together: Not on file    Attends religious service: Not on file    Active member of club or organization: Not on file    Attends meetings of clubs or organizations: Not on file    Relationship status: Not on file  . Intimate partner violence:    Fear of current or ex partner: Not on file    Emotionally abused: Not on file    Physically abused: Not on file    Forced sexual activity: Not on file  Other Topics Concern  . Not on file  Social History Narrative   Patient lives at home alone widowed.   Retired   One cup of coffee daily.   Right handed.   10th grade education.   Family History  Problem Relation Age of Onset  . Diabetes Mother   . High blood pressure Father    Past Surgical History:   Procedure Laterality Date  . ABDOMINAL HYSTERECTOMY    . EYE SURGERY    . THYROIDECTOMY N/A 08/04/2017   Procedure: TOTAL THYROIDECTOMY;  Surgeon: Darnell Level, MD;  Location: [facility];  Service: General;  Laterality: Vertis Kelch, MD 04/29/18 [phone]

## 2018-04-25 NOTE — ED Triage Notes (Signed)
Pt presents with elevated blood pressure and elevated blood sugar level.

## 2018-04-26 LAB — URINALYSIS, ROUTINE W REFLEX MICROSCOPIC
BACTERIA UA: NONE SEEN
Bilirubin Urine: NEGATIVE
GLUCOSE, UA: NEGATIVE mg/dL
Hgb urine dipstick: NEGATIVE
KETONES UR: NEGATIVE mg/dL
LEUKOCYTES UA: NEGATIVE
Nitrite: NEGATIVE
Protein, ur: 100 mg/dL — AB
Specific Gravity, Urine: 1.014 (ref 1.005–1.030)
pH: 6 (ref 5.0–8.0)

## 2018-04-26 MED ORDER — CLONIDINE HCL 0.1 MG PO TABS
0.1000 mg | ORAL_TABLET | Freq: Once | ORAL | Status: AC
  Administered 2018-04-26: 0.1 mg via ORAL
  Filled 2018-04-26: qty 1

## 2018-04-26 NOTE — Discharge Instructions (Signed)
All your labs and imaging studies today were normal and blood pressure looks much better. Please follow-up with your primary care doctor and have them review your medications to see if any adjustments need to be made. Watch diet, limit salt/sodium. Return here for any new/acute changes.

## 2018-04-27 LAB — CBG MONITORING, ED: Glucose-Capillary: 92 mg/dL (ref 70–99)

## 2018-06-18 ENCOUNTER — Other Ambulatory Visit: Payer: Self-pay | Admitting: Internal Medicine

## 2018-06-18 ENCOUNTER — Ambulatory Visit
Admission: RE | Admit: 2018-06-18 | Discharge: 2018-06-18 | Disposition: A | Discharge status: Home / Self Care | Payer: Medicare Other | Source: Ambulatory Visit | Attending: Internal Medicine | Admitting: Internal Medicine

## 2018-06-18 DIAGNOSIS — M25552 Pain in left hip: Secondary | ICD-10-CM

## 2019-06-11 ENCOUNTER — Encounter (INDEPENDENT_AMBULATORY_CARE_PROVIDER_SITE_OTHER): Payer: Self-pay

## 2019-06-11 ENCOUNTER — Other Ambulatory Visit: Payer: Self-pay | Admitting: Internal Medicine

## 2019-06-11 ENCOUNTER — Ambulatory Visit
Admission: RE | Admit: 2019-06-11 | Discharge: 2019-06-11 | Disposition: A | Discharge status: Home / Self Care | Payer: Medicare Other | Source: Ambulatory Visit | Attending: Internal Medicine | Admitting: Internal Medicine

## 2019-06-11 DIAGNOSIS — M25571 Pain in right ankle and joints of right foot: Secondary | ICD-10-CM

## 2019-06-11 DIAGNOSIS — M25572 Pain in left ankle and joints of left foot: Secondary | ICD-10-CM

## 2020-04-18 ENCOUNTER — Ambulatory Visit: Payer: Medicare Other | Attending: Internal Medicine

## 2020-04-18 ENCOUNTER — Ambulatory Visit: Payer: Medicare Other

## 2020-04-18 DIAGNOSIS — Z23 Encounter for immunization: Secondary | ICD-10-CM

## 2020-04-18 NOTE — Progress Notes (Signed)
   Covid-19 Vaccination Clinic  Name:  Diane Knight    MRN: [id number] DOB: 1940/11/30  04/18/2020  Diane Knight was observed post Covid-19 immunization for 15 minutes without incident. She was provided with Vaccine Information Sheet and instruction to access the V-Safe system.   Diane Knight was instructed to call 911 with any severe reactions post vaccine: Marland Kitchen Difficulty breathing  . Swelling of face and throat  . A fast heartbeat  . A bad rash all over body  . Dizziness and weakness

## 2020-08-10 DIAGNOSIS — E7800 Pure hypercholesterolemia, unspecified: Secondary | ICD-10-CM | POA: Diagnosis not present

## 2020-08-10 DIAGNOSIS — M48061 Spinal stenosis, lumbar region without neurogenic claudication: Secondary | ICD-10-CM | POA: Diagnosis not present

## 2020-08-10 DIAGNOSIS — Z23 Encounter for immunization: Secondary | ICD-10-CM | POA: Diagnosis not present

## 2020-08-10 DIAGNOSIS — M8580 Other specified disorders of bone density and structure, unspecified site: Secondary | ICD-10-CM | POA: Diagnosis not present

## 2020-08-10 DIAGNOSIS — Z794 Long term (current) use of insulin: Secondary | ICD-10-CM | POA: Diagnosis not present

## 2020-08-10 DIAGNOSIS — E11319 Type 2 diabetes mellitus with unspecified diabetic retinopathy without macular edema: Secondary | ICD-10-CM | POA: Diagnosis not present

## 2020-08-10 DIAGNOSIS — E039 Hypothyroidism, unspecified: Secondary | ICD-10-CM | POA: Diagnosis not present

## 2020-08-10 DIAGNOSIS — M79643 Pain in unspecified hand: Secondary | ICD-10-CM | POA: Diagnosis not present

## 2020-08-10 DIAGNOSIS — I10 Essential (primary) hypertension: Secondary | ICD-10-CM | POA: Diagnosis not present

## 2020-08-18 DIAGNOSIS — E1151 Type 2 diabetes mellitus with diabetic peripheral angiopathy without gangrene: Secondary | ICD-10-CM | POA: Diagnosis not present

## 2020-08-18 DIAGNOSIS — L603 Nail dystrophy: Secondary | ICD-10-CM | POA: Diagnosis not present

## 2020-08-18 DIAGNOSIS — B351 Tinea unguium: Secondary | ICD-10-CM | POA: Diagnosis not present

## 2020-08-18 DIAGNOSIS — L84 Corns and callosities: Secondary | ICD-10-CM | POA: Diagnosis not present

## 2020-08-23 DIAGNOSIS — M79641 Pain in right hand: Secondary | ICD-10-CM | POA: Diagnosis not present

## 2020-08-23 DIAGNOSIS — M2550 Pain in unspecified joint: Secondary | ICD-10-CM | POA: Diagnosis not present

## 2020-08-23 DIAGNOSIS — M79642 Pain in left hand: Secondary | ICD-10-CM | POA: Diagnosis not present

## 2020-08-23 DIAGNOSIS — R5382 Chronic fatigue, unspecified: Secondary | ICD-10-CM | POA: Diagnosis not present

## 2020-08-25 DIAGNOSIS — M5459 Other low back pain: Secondary | ICD-10-CM | POA: Diagnosis not present

## 2020-08-25 DIAGNOSIS — M2560 Stiffness of unspecified joint, not elsewhere classified: Secondary | ICD-10-CM | POA: Diagnosis not present

## 2020-08-28 DIAGNOSIS — M5459 Other low back pain: Secondary | ICD-10-CM | POA: Diagnosis not present

## 2020-08-28 DIAGNOSIS — M545 Low back pain, unspecified: Secondary | ICD-10-CM | POA: Diagnosis not present

## 2020-08-28 DIAGNOSIS — M2560 Stiffness of unspecified joint, not elsewhere classified: Secondary | ICD-10-CM | POA: Diagnosis not present

## 2020-08-31 DIAGNOSIS — E11319 Type 2 diabetes mellitus with unspecified diabetic retinopathy without macular edema: Secondary | ICD-10-CM | POA: Diagnosis not present

## 2020-08-31 DIAGNOSIS — E039 Hypothyroidism, unspecified: Secondary | ICD-10-CM | POA: Diagnosis not present

## 2020-08-31 DIAGNOSIS — E782 Mixed hyperlipidemia: Secondary | ICD-10-CM | POA: Diagnosis not present

## 2020-08-31 DIAGNOSIS — M8580 Other specified disorders of bone density and structure, unspecified site: Secondary | ICD-10-CM | POA: Diagnosis not present

## 2020-08-31 DIAGNOSIS — E1142 Type 2 diabetes mellitus with diabetic polyneuropathy: Secondary | ICD-10-CM | POA: Diagnosis not present

## 2020-08-31 DIAGNOSIS — E890 Postprocedural hypothyroidism: Secondary | ICD-10-CM | POA: Diagnosis not present

## 2020-08-31 DIAGNOSIS — I10 Essential (primary) hypertension: Secondary | ICD-10-CM | POA: Diagnosis not present

## 2020-08-31 DIAGNOSIS — E7800 Pure hypercholesterolemia, unspecified: Secondary | ICD-10-CM | POA: Diagnosis not present

## 2020-09-08 DIAGNOSIS — M2560 Stiffness of unspecified joint, not elsewhere classified: Secondary | ICD-10-CM | POA: Diagnosis not present

## 2020-09-08 DIAGNOSIS — M545 Low back pain, unspecified: Secondary | ICD-10-CM | POA: Diagnosis not present

## 2020-09-08 DIAGNOSIS — M5459 Other low back pain: Secondary | ICD-10-CM | POA: Diagnosis not present

## 2020-09-15 DIAGNOSIS — M2560 Stiffness of unspecified joint, not elsewhere classified: Secondary | ICD-10-CM | POA: Diagnosis not present

## 2020-09-15 DIAGNOSIS — M5459 Other low back pain: Secondary | ICD-10-CM | POA: Diagnosis not present

## 2020-09-15 DIAGNOSIS — M545 Low back pain, unspecified: Secondary | ICD-10-CM | POA: Diagnosis not present

## 2020-09-22 DIAGNOSIS — M5459 Other low back pain: Secondary | ICD-10-CM | POA: Diagnosis not present

## 2020-09-22 DIAGNOSIS — M545 Low back pain, unspecified: Secondary | ICD-10-CM | POA: Diagnosis not present

## 2020-09-22 DIAGNOSIS — M2560 Stiffness of unspecified joint, not elsewhere classified: Secondary | ICD-10-CM | POA: Diagnosis not present

## 2020-09-27 DIAGNOSIS — M2550 Pain in unspecified joint: Secondary | ICD-10-CM | POA: Diagnosis not present

## 2020-09-27 DIAGNOSIS — G629 Polyneuropathy, unspecified: Secondary | ICD-10-CM | POA: Diagnosis not present

## 2020-09-29 DIAGNOSIS — M5459 Other low back pain: Secondary | ICD-10-CM | POA: Diagnosis not present

## 2020-09-29 DIAGNOSIS — M2560 Stiffness of unspecified joint, not elsewhere classified: Secondary | ICD-10-CM | POA: Diagnosis not present

## 2020-09-29 DIAGNOSIS — M545 Low back pain, unspecified: Secondary | ICD-10-CM | POA: Diagnosis not present

## 2020-10-06 DIAGNOSIS — M5459 Other low back pain: Secondary | ICD-10-CM | POA: Diagnosis not present

## 2020-10-06 DIAGNOSIS — M2560 Stiffness of unspecified joint, not elsewhere classified: Secondary | ICD-10-CM | POA: Diagnosis not present

## 2020-10-06 DIAGNOSIS — M545 Low back pain, unspecified: Secondary | ICD-10-CM | POA: Diagnosis not present

## 2020-10-13 DIAGNOSIS — M2560 Stiffness of unspecified joint, not elsewhere classified: Secondary | ICD-10-CM | POA: Diagnosis not present

## 2020-10-13 DIAGNOSIS — M545 Low back pain, unspecified: Secondary | ICD-10-CM | POA: Diagnosis not present

## 2020-10-13 DIAGNOSIS — M5459 Other low back pain: Secondary | ICD-10-CM | POA: Diagnosis not present

## 2020-10-23 DIAGNOSIS — E11319 Type 2 diabetes mellitus with unspecified diabetic retinopathy without macular edema: Secondary | ICD-10-CM | POA: Diagnosis not present

## 2020-10-23 DIAGNOSIS — E039 Hypothyroidism, unspecified: Secondary | ICD-10-CM | POA: Diagnosis not present

## 2020-10-23 DIAGNOSIS — E7800 Pure hypercholesterolemia, unspecified: Secondary | ICD-10-CM | POA: Diagnosis not present

## 2020-10-23 DIAGNOSIS — M8580 Other specified disorders of bone density and structure, unspecified site: Secondary | ICD-10-CM | POA: Diagnosis not present

## 2020-10-23 DIAGNOSIS — E890 Postprocedural hypothyroidism: Secondary | ICD-10-CM | POA: Diagnosis not present

## 2020-10-23 DIAGNOSIS — E782 Mixed hyperlipidemia: Secondary | ICD-10-CM | POA: Diagnosis not present

## 2020-10-23 DIAGNOSIS — I10 Essential (primary) hypertension: Secondary | ICD-10-CM | POA: Diagnosis not present

## 2020-10-23 DIAGNOSIS — E1142 Type 2 diabetes mellitus with diabetic polyneuropathy: Secondary | ICD-10-CM | POA: Diagnosis not present

## 2020-10-27 DIAGNOSIS — M5459 Other low back pain: Secondary | ICD-10-CM | POA: Diagnosis not present

## 2020-10-27 DIAGNOSIS — M2560 Stiffness of unspecified joint, not elsewhere classified: Secondary | ICD-10-CM | POA: Diagnosis not present

## 2020-10-27 DIAGNOSIS — M545 Low back pain, unspecified: Secondary | ICD-10-CM | POA: Diagnosis not present

## 2020-11-03 DIAGNOSIS — M545 Low back pain, unspecified: Secondary | ICD-10-CM | POA: Diagnosis not present

## 2020-11-03 DIAGNOSIS — M5459 Other low back pain: Secondary | ICD-10-CM | POA: Diagnosis not present

## 2020-11-03 DIAGNOSIS — M2560 Stiffness of unspecified joint, not elsewhere classified: Secondary | ICD-10-CM | POA: Diagnosis not present

## 2020-11-10 DIAGNOSIS — E1151 Type 2 diabetes mellitus with diabetic peripheral angiopathy without gangrene: Secondary | ICD-10-CM | POA: Diagnosis not present

## 2020-11-10 DIAGNOSIS — L603 Nail dystrophy: Secondary | ICD-10-CM | POA: Diagnosis not present

## 2020-11-10 DIAGNOSIS — L84 Corns and callosities: Secondary | ICD-10-CM | POA: Diagnosis not present

## 2020-11-10 DIAGNOSIS — B351 Tinea unguium: Secondary | ICD-10-CM | POA: Diagnosis not present

## 2020-11-17 DIAGNOSIS — M5459 Other low back pain: Secondary | ICD-10-CM | POA: Diagnosis not present

## 2020-11-17 DIAGNOSIS — M2560 Stiffness of unspecified joint, not elsewhere classified: Secondary | ICD-10-CM | POA: Diagnosis not present

## 2020-11-17 DIAGNOSIS — M545 Low back pain, unspecified: Secondary | ICD-10-CM | POA: Diagnosis not present

## 2020-11-24 DIAGNOSIS — M2560 Stiffness of unspecified joint, not elsewhere classified: Secondary | ICD-10-CM | POA: Diagnosis not present

## 2020-11-24 DIAGNOSIS — M5459 Other low back pain: Secondary | ICD-10-CM | POA: Diagnosis not present

## 2020-11-24 DIAGNOSIS — M545 Low back pain, unspecified: Secondary | ICD-10-CM | POA: Diagnosis not present

## 2020-12-01 DIAGNOSIS — M545 Low back pain, unspecified: Secondary | ICD-10-CM | POA: Diagnosis not present

## 2020-12-01 DIAGNOSIS — M5459 Other low back pain: Secondary | ICD-10-CM | POA: Diagnosis not present

## 2020-12-01 DIAGNOSIS — M2560 Stiffness of unspecified joint, not elsewhere classified: Secondary | ICD-10-CM | POA: Diagnosis not present

## 2020-12-04 DIAGNOSIS — E7800 Pure hypercholesterolemia, unspecified: Secondary | ICD-10-CM | POA: Diagnosis not present

## 2020-12-04 DIAGNOSIS — E782 Mixed hyperlipidemia: Secondary | ICD-10-CM | POA: Diagnosis not present

## 2020-12-04 DIAGNOSIS — E039 Hypothyroidism, unspecified: Secondary | ICD-10-CM | POA: Diagnosis not present

## 2020-12-04 DIAGNOSIS — E11319 Type 2 diabetes mellitus with unspecified diabetic retinopathy without macular edema: Secondary | ICD-10-CM | POA: Diagnosis not present

## 2020-12-04 DIAGNOSIS — I10 Essential (primary) hypertension: Secondary | ICD-10-CM | POA: Diagnosis not present

## 2020-12-04 DIAGNOSIS — E1142 Type 2 diabetes mellitus with diabetic polyneuropathy: Secondary | ICD-10-CM | POA: Diagnosis not present

## 2020-12-04 DIAGNOSIS — M8580 Other specified disorders of bone density and structure, unspecified site: Secondary | ICD-10-CM | POA: Diagnosis not present

## 2020-12-28 DIAGNOSIS — I10 Essential (primary) hypertension: Secondary | ICD-10-CM | POA: Diagnosis not present

## 2021-01-26 DIAGNOSIS — I10 Essential (primary) hypertension: Secondary | ICD-10-CM | POA: Diagnosis not present

## 2021-02-09 ENCOUNTER — Other Ambulatory Visit: Payer: Self-pay | Admitting: Internal Medicine

## 2021-02-09 DIAGNOSIS — Z79899 Other long term (current) drug therapy: Secondary | ICD-10-CM | POA: Diagnosis not present

## 2021-02-09 DIAGNOSIS — Z0000 Encounter for general adult medical examination without abnormal findings: Secondary | ICD-10-CM | POA: Diagnosis not present

## 2021-02-09 DIAGNOSIS — E7800 Pure hypercholesterolemia, unspecified: Secondary | ICD-10-CM | POA: Diagnosis not present

## 2021-02-09 DIAGNOSIS — E11319 Type 2 diabetes mellitus with unspecified diabetic retinopathy without macular edema: Secondary | ICD-10-CM | POA: Diagnosis not present

## 2021-02-09 DIAGNOSIS — E1165 Type 2 diabetes mellitus with hyperglycemia: Secondary | ICD-10-CM | POA: Diagnosis not present

## 2021-02-09 DIAGNOSIS — Z7984 Long term (current) use of oral hypoglycemic drugs: Secondary | ICD-10-CM | POA: Diagnosis not present

## 2021-02-09 DIAGNOSIS — E039 Hypothyroidism, unspecified: Secondary | ICD-10-CM | POA: Diagnosis not present

## 2021-02-09 DIAGNOSIS — E1142 Type 2 diabetes mellitus with diabetic polyneuropathy: Secondary | ICD-10-CM | POA: Diagnosis not present

## 2021-02-09 DIAGNOSIS — Z794 Long term (current) use of insulin: Secondary | ICD-10-CM | POA: Diagnosis not present

## 2021-02-09 DIAGNOSIS — G629 Polyneuropathy, unspecified: Secondary | ICD-10-CM | POA: Diagnosis not present

## 2021-02-09 DIAGNOSIS — R0989 Other specified symptoms and signs involving the circulatory and respiratory systems: Secondary | ICD-10-CM

## 2021-02-09 DIAGNOSIS — Z1231 Encounter for screening mammogram for malignant neoplasm of breast: Secondary | ICD-10-CM | POA: Diagnosis not present

## 2021-02-09 DIAGNOSIS — Z1389 Encounter for screening for other disorder: Secondary | ICD-10-CM | POA: Diagnosis not present

## 2021-02-09 DIAGNOSIS — I10 Essential (primary) hypertension: Secondary | ICD-10-CM | POA: Diagnosis not present

## 2021-02-09 DIAGNOSIS — M8580 Other specified disorders of bone density and structure, unspecified site: Secondary | ICD-10-CM | POA: Diagnosis not present

## 2021-02-09 DIAGNOSIS — Z5181 Encounter for therapeutic drug level monitoring: Secondary | ICD-10-CM | POA: Diagnosis not present

## 2021-02-12 ENCOUNTER — Other Ambulatory Visit: Payer: Self-pay | Admitting: Internal Medicine

## 2021-02-12 DIAGNOSIS — M8580 Other specified disorders of bone density and structure, unspecified site: Secondary | ICD-10-CM

## 2021-02-12 DIAGNOSIS — Z1231 Encounter for screening mammogram for malignant neoplasm of breast: Secondary | ICD-10-CM

## 2021-02-22 ENCOUNTER — Ambulatory Visit
Admission: RE | Admit: 2021-02-22 | Discharge: 2021-02-22 | Disposition: A | Discharge status: Home / Self Care | Payer: Medicare Other | Source: Ambulatory Visit | Attending: Internal Medicine | Admitting: Internal Medicine

## 2021-02-22 ENCOUNTER — Other Ambulatory Visit: Payer: Medicare Other

## 2021-02-22 ENCOUNTER — Other Ambulatory Visit: Payer: Self-pay

## 2021-02-22 DIAGNOSIS — R0989 Other specified symptoms and signs involving the circulatory and respiratory systems: Secondary | ICD-10-CM | POA: Diagnosis not present

## 2021-02-22 DIAGNOSIS — Z1231 Encounter for screening mammogram for malignant neoplasm of breast: Secondary | ICD-10-CM

## 2021-02-26 DIAGNOSIS — B351 Tinea unguium: Secondary | ICD-10-CM | POA: Diagnosis not present

## 2021-02-26 DIAGNOSIS — E1151 Type 2 diabetes mellitus with diabetic peripheral angiopathy without gangrene: Secondary | ICD-10-CM | POA: Diagnosis not present

## 2021-02-28 DIAGNOSIS — I10 Essential (primary) hypertension: Secondary | ICD-10-CM | POA: Diagnosis not present

## 2021-03-05 NOTE — Progress Notes (Signed)
[facility] [place]  ASSESSMENT / PLAN: Diane Knight is a 80 y.o. female with atherosclerosis of native arteries of bilateral lower extremities causing atypical symptoms.  Patient counseled patients with asymptomatic peripheral arterial disease or claudication have a 1-2% risk of developing chronic limb threatening ischemia, but a 15-30% risk of mortality in the next 5 years. Intervention should only be considered for medically optimized patients with disabling symptoms.   Recommend the following which can slow the progression of atherosclerosis and reduce the risk of major adverse cardiac / limb events:  Complete cessation from all tobacco products. Blood glucose control with goal A1c < 7%. Blood pressure control with goal blood pressure < 140/90 mmHg. Lipid reduction therapy with goal LDL-C <100 mg/dL (<86 if symptomatic from PAD).  Aspirin 81mg  PO QD.  Atorvastatin 40-80mg  PO QD (or other "high intensity" statin therapy). Daily walking to and past the point of discomfort. Patient counseled to keep a log of exercise distance.  Follow up in 1 year with ABI with [facility] for surveillance.   CHIEF COMPLAINT: leg discomfort  HISTORY OF PRESENT ILLNESS: Diane Knight is a 80 y.o. female referred to clinic for evaluation of peripheral arterial disease.  Patient reports her feet feel cold.  She does not report any pain in her feet, per se.  She does not ambulate fast or far enough to claudicate.  She is able to get around a grocery store, but does not walk.  She uses a motorized scooter preferentially.  She does not have symptoms typical of ischemic rest pain.  She has no ulcers about her feet  Past Medical History:  Diagnosis Date   Cataract    Diabetes mellitus    TYPE 2    Fibromyalgia    Goiter    Hyperlipidemia    Hypertension    Neuropathy    Tobacco dependence     Past Surgical History:  Procedure Laterality Date   ABDOMINAL HYSTERECTOMY     EYE  SURGERY     THYROIDECTOMY N/A 08/04/2017   Procedure: TOTAL THYROIDECTOMY;  Surgeon: 10/02/2017, MD;  Location: [facility];  Service: General;  Laterality: N/A;    Family History  Problem Relation Age of Onset   Diabetes Mother    High blood pressure Father     Social History   Socioeconomic History   Marital status: Widowed    Spouse name: Not on file   Number of children: 6   Years of education: 10th   Highest education level: Not on file  Occupational History    Employer: RETIRED    Comment: retired  Tobacco Use   Smoking status: Every Day    Packs/day: 0.30    Types: Cigarettes   Smokeless tobacco: Never  Substance and Sexual Activity   Alcohol use: No   Drug use: No   Sexual activity: Never  Other Topics Concern   Not on file  Social History Narrative   Patient lives at home alone widowed.   Retired   One cup of coffee daily.   Right handed.   10th grade education.   Social Determinants of Health   Financial Resource Strain: Not on file  Food Insecurity: Not on file  Transportation Needs: Not on file  Physical Activity: Not on file  Stress: Not on file  Social Connections: Not on file  Intimate Partner Violence: Not on file    Allergies  Allergen Reactions   Metformin And Related Other (See  Comments)    GI SIDE EFFECTS    Current Outpatient Medications  Medication Sig Dispense Refill   acetaminophen (TYLENOL) 500 MG tablet Take 500 mg by mouth every 6 (six) hours as needed for mild pain or headache.      amLODipine (NORVASC) 10 MG tablet Take 10 mg by mouth daily.     aspirin EC 81 MG tablet Take 81 mg by mouth daily.     atorvastatin (LIPITOR) 10 MG tablet Take 10 mg by mouth daily.     calcitRIOL (ROCALTROL) 0.25 MCG capsule Take 1 capsule (0.25 mcg total) by mouth 2 (two) times daily. 28 capsule 0   calcium carbonate (TUMS) 500 MG chewable tablet Chew 3 tablets (600 mg of elemental calcium total) by mouth 4 (four) times daily -  before meals and at  bedtime. 150 tablet 1   insulin NPH-regular Human (NOVOLIN 70/30) (70-30) 100 UNIT/ML injection Inject 24 Units into the skin 2 (two) times daily with a meal.      levothyroxine (SYNTHROID, LEVOTHROID) 100 MCG tablet Take 100 mcg by mouth daily before breakfast.      meclizine (ANTIVERT) 25 MG tablet Take 1 tablet (25 mg total) by mouth 3 (three) times daily as needed for dizziness. (Patient not taking: Reported on 04/26/2017) 30 tablet 0   methocarbamol (ROBAXIN) 500 MG tablet Take 1 tablet (500 mg total) by mouth 2 (two) times daily. (Patient not taking: Reported on 06/15/2016) 10 tablet 0   naproxen sodium (ALEVE) 220 MG tablet Take 220 mg by mouth.     nortriptyline (PAMELOR) 25 MG capsule TAKE 1 CAPSULE BY MOUTH AT BEDTIME (Patient not taking: Reported on 06/15/2016) 30 capsule 0   ondansetron (ZOFRAN) 4 MG tablet Take 1 tablet (4 mg total) by mouth every 6 (six) hours. (Patient not taking: Reported on 04/26/2017) 12 tablet 0   traMADol (ULTRAM) 50 MG tablet Take 1-2 tablets (50-100 mg total) by mouth every 6 (six) hours as needed for moderate pain (mild pain not relieved by tylenol). 24 tablet 0   valsartan-hydrochlorothiazide (DIOVAN-HCT) 160-12.5 MG per tablet Take 1 tablet by mouth daily.     No current facility-administered medications for this visit.    REVIEW OF SYSTEMS:  [X]  denotes positive finding, [ ]  denotes negative finding Cardiac  Comments:  Chest pain or chest pressure:    Shortness of breath upon exertion:    Short of breath when lying flat:    Irregular heart rhythm:        Vascular    Pain in calf, thigh, or hip brought on by ambulation: x   Pain in feet at night that wakes you up from your sleep:     Blood clot in your veins:    Leg swelling:         Pulmonary    Oxygen at home:    Productive cough:     Wheezing:         Neurologic    Sudden weakness in arms or legs:     Sudden numbness in arms or legs:     Sudden onset of difficulty speaking or slurred  speech:    Temporary loss of vision in one eye:     Problems with dizziness:         Gastrointestinal    Blood in stool:     Vomited blood:         Genitourinary    Burning when urinating:     Blood in urine:  Psychiatric    Major depression:         Hematologic    Bleeding problems:    Problems with blood clotting too easily:        Skin    Rashes or ulcers: x Intertriginous      Constitutional    Fever or chills:      PHYSICAL EXAM Vitals:   03/06/21 0821  BP: 140/69  Pulse: 68  Resp: 20  Temp: 98.2 F (36.8 C)  SpO2: 99%  Weight: 201 lb (91.2 kg)  Height: 5\' 6"  (1.676 m)    Constitutional: well appearing. no distress. Appears well nourished.  Neurologic: CN intact. no focal findings. no sensory loss. Psychiatric:  Mood and affect symmetric and appropriate. Eyes:  No icterus. No conjunctival pallor. Ears, nose, throat:  mucous membranes moist. Midline trachea.  Cardiac: regular rate and rhythm.  Respiratory:  unlabored. Abdominal:  soft, non-tender, non-distended.  Peripheral vascular: feet are warm. <3s capillary refill. No palpable pedal or popliteal pulses.  Extremity: No edema. No cyanosis. No pallor.  Skin: No gangrene. no ulceration.  Lymphatic: no Stemmer's sign. no palpable lymphadenopathy.  PERTINENT LABORATORY AND RADIOLOGIC DATA  Most recent CBC CBC Latest Ref Rng & Units 04/25/2018 04/25/2018 07/25/2017  WBC 4.0 - 10.5 K/uL 6.1 - 7.8  Hemoglobin 12.0 - 15.0 g/dL 11.5(L) 12.6 9.2(L)  Hematocrit 36.0 - 46.0 % 38.1 37.0 29.0(L)  Platelets 150 - 400 K/uL 302 - 308     Most recent CMP CMP Latest Ref Rng & Units 04/25/2018 04/25/2018 08/06/2017  Glucose 70 - 99 mg/dL 93 96 161(W132(H)  BUN 8 - 23 mg/dL 15 17 96(E24(H)  Creatinine 0.44 - 1.00 mg/dL 4.54(U1.10(H) 9.81(X1.20(H) 9.140.85  Sodium 135 - 145 mmol/L 140 144 135  Potassium 3.5 - 5.1 mmol/L 3.3(L) 3.4(L) 3.6  Chloride 98 - 111 mmol/L 104 103 101  CO2 22 - 32 mmol/L 28 - 27  Calcium 8.9 - 10.3 mg/dL  7.8(G8.6(L) - 7.9(L)  Total Protein 6.5 - 8.1 g/dL - - -  Total Bilirubin 0.3 - 1.2 mg/dL - - -  Alkaline Phos 38 - 126 U/L - - -  AST 15 - 41 U/L - - -  ALT 14 - 54 U/L - - -    Renal function CrCl cannot be calculated (Patient's most recent lab result is older than the maximum 21 days allowed.).  Hgb A1c MFr Bld (%)  Date Value  07/25/2017 5.4    No results found for: LDLCALC, LDLC, HIRISKLDL, POCLDL, LDLDIRECT, REALLDLC, TOTLDLC   CLINICAL DATA:  Decreased pulses in feet   EXAM: NONINVASIVE PHYSIOLOGIC VASCULAR STUDY OF BILATERAL LOWER EXTREMITIES   TECHNIQUE: Non-invasive vascular evaluation of both lower extremities was performed at rest, including calculation of ankle-brachial indices, multiple segmental pressure evaluation, segmental Doppler and segmental pulse volume recording.   COMPARISON:  None.   FINDINGS: Right Lower Extremity   Resting ABI:  1.06   Resting TBI: 0.59   Segmental Pressures: Significant pressure gradient noted between the low thigh and calf.   Great toe pressure: 92 mm Hg   Arterial Waveforms: Monophasic waveform seen in the right dorsalis pedis artery. Otherwise normal right lower extremity waveforms.   PVRs: Normal PVRs with maintained waveform amplitude, augmentation and quality.   Left Lower Extremity:   Resting ABI: 1.08   Resting TBI: 0.72   Segmental Pressures: Normal segmental pressures, no significant (20 mmHg) pressure gradient between adjacent segments. Great toe pressure: 113 mm Hg  Arterial Waveforms: Monophasic waveform seen in the posterior tibial artery. Otherwise triphasic waveform seen throughout.   PVRs: Normal PVRs with maintained waveform amplitude, augmentation and quality.   Other: Symmetric upper extremity pressures.   Ankle Brachial index   > 1.4 Non diagnostic secondary to incompressible vessel calcifications   1.0-1.4       Normal   0.9-0.99     Borderline PAD   0.8-0.89     Mild PAD    0.5-0.79     Moderate PAD   < 0.5          Severe PAD   Toe Brachial Index   Normal     >0.65   Moderate  0.53-0.64   Severe     <0.23   Toe Pressures   Absolute toe pressure >78mmHg sufficient for wound healing.   Toe pressures <13mmHg = critical limb ischemia.   IMPRESSION: Significant pressure gradient noted between the right low thigh and calf suspicious for underlying popliteal artery stenosis.   Monophasic waveform seen in the right dorsalis pedis and left posterior tibial artery also suspicious for stenosis.     Electronically Signed   By: Acquanetta Belling M.D.   On: 02/22/2021 10:02  Rande Brunt. Lenell Antu, MD [facility] Phone Number: [phone] 03/05/2021 7:28 PM  Total time spent on preparing this encounter including chart review, data review, collecting history, examining the patient, coordinating care for this new patient, 60 minutes.  Portions of this report may have been transcribed using voice recognition software.  Every effort has been made to ensure accuracy; however, inadvertent computerized transcription errors may still be present.

## 2021-03-06 ENCOUNTER — Encounter: Payer: Self-pay | Admitting: Vascular Surgery

## 2021-03-06 ENCOUNTER — Ambulatory Visit: Payer: Medicare Other | Admitting: Vascular Surgery

## 2021-03-06 ENCOUNTER — Other Ambulatory Visit: Payer: Self-pay

## 2021-03-06 VITALS — BP 140/69 | HR 68 | Temp 98.2°F | Resp 20 | Ht 66.0 in | Wt 201.0 lb

## 2021-03-06 DIAGNOSIS — I739 Peripheral vascular disease, unspecified: Secondary | ICD-10-CM | POA: Diagnosis not present

## 2021-03-15 DIAGNOSIS — E1169 Type 2 diabetes mellitus with other specified complication: Secondary | ICD-10-CM | POA: Diagnosis not present

## 2021-04-05 DIAGNOSIS — H35372 Puckering of macula, left eye: Secondary | ICD-10-CM | POA: Diagnosis not present

## 2021-04-05 DIAGNOSIS — H40053 Ocular hypertension, bilateral: Secondary | ICD-10-CM | POA: Diagnosis not present

## 2021-04-05 DIAGNOSIS — E119 Type 2 diabetes mellitus without complications: Secondary | ICD-10-CM | POA: Diagnosis not present

## 2021-04-05 DIAGNOSIS — H35363 Drusen (degenerative) of macula, bilateral: Secondary | ICD-10-CM | POA: Diagnosis not present

## 2021-04-05 DIAGNOSIS — H0102B Squamous blepharitis left eye, upper and lower eyelids: Secondary | ICD-10-CM | POA: Diagnosis not present

## 2021-04-05 DIAGNOSIS — H04123 Dry eye syndrome of bilateral lacrimal glands: Secondary | ICD-10-CM | POA: Diagnosis not present

## 2021-04-05 DIAGNOSIS — H1045 Other chronic allergic conjunctivitis: Secondary | ICD-10-CM | POA: Diagnosis not present

## 2021-04-05 DIAGNOSIS — Z961 Presence of intraocular lens: Secondary | ICD-10-CM | POA: Diagnosis not present

## 2021-04-05 DIAGNOSIS — H0102A Squamous blepharitis right eye, upper and lower eyelids: Secondary | ICD-10-CM | POA: Diagnosis not present

## 2021-04-19 DIAGNOSIS — E1142 Type 2 diabetes mellitus with diabetic polyneuropathy: Secondary | ICD-10-CM | POA: Diagnosis not present

## 2021-04-19 DIAGNOSIS — E890 Postprocedural hypothyroidism: Secondary | ICD-10-CM | POA: Diagnosis not present

## 2021-04-19 DIAGNOSIS — M8580 Other specified disorders of bone density and structure, unspecified site: Secondary | ICD-10-CM | POA: Diagnosis not present

## 2021-04-19 DIAGNOSIS — E11319 Type 2 diabetes mellitus with unspecified diabetic retinopathy without macular edema: Secondary | ICD-10-CM | POA: Diagnosis not present

## 2021-04-19 DIAGNOSIS — E782 Mixed hyperlipidemia: Secondary | ICD-10-CM | POA: Diagnosis not present

## 2021-04-19 DIAGNOSIS — E7800 Pure hypercholesterolemia, unspecified: Secondary | ICD-10-CM | POA: Diagnosis not present

## 2021-04-19 DIAGNOSIS — I10 Essential (primary) hypertension: Secondary | ICD-10-CM | POA: Diagnosis not present

## 2021-04-19 DIAGNOSIS — E039 Hypothyroidism, unspecified: Secondary | ICD-10-CM | POA: Diagnosis not present

## 2021-04-23 DIAGNOSIS — R42 Dizziness and giddiness: Secondary | ICD-10-CM | POA: Diagnosis not present

## 2021-04-30 DIAGNOSIS — I10 Essential (primary) hypertension: Secondary | ICD-10-CM | POA: Diagnosis not present

## 2021-05-28 DIAGNOSIS — B351 Tinea unguium: Secondary | ICD-10-CM | POA: Diagnosis not present

## 2021-05-30 DIAGNOSIS — I10 Essential (primary) hypertension: Secondary | ICD-10-CM | POA: Diagnosis not present

## 2021-07-31 DIAGNOSIS — E890 Postprocedural hypothyroidism: Secondary | ICD-10-CM | POA: Diagnosis not present

## 2021-07-31 DIAGNOSIS — M8580 Other specified disorders of bone density and structure, unspecified site: Secondary | ICD-10-CM | POA: Diagnosis not present

## 2021-07-31 DIAGNOSIS — E039 Hypothyroidism, unspecified: Secondary | ICD-10-CM | POA: Diagnosis not present

## 2021-07-31 DIAGNOSIS — E1142 Type 2 diabetes mellitus with diabetic polyneuropathy: Secondary | ICD-10-CM | POA: Diagnosis not present

## 2021-07-31 DIAGNOSIS — I10 Essential (primary) hypertension: Secondary | ICD-10-CM | POA: Diagnosis not present

## 2021-07-31 DIAGNOSIS — E7800 Pure hypercholesterolemia, unspecified: Secondary | ICD-10-CM | POA: Diagnosis not present

## 2021-07-31 DIAGNOSIS — E11319 Type 2 diabetes mellitus with unspecified diabetic retinopathy without macular edema: Secondary | ICD-10-CM | POA: Diagnosis not present

## 2021-08-08 ENCOUNTER — Other Ambulatory Visit: Payer: Self-pay | Admitting: Internal Medicine

## 2021-08-08 ENCOUNTER — Inpatient Hospital Stay: Admission: RE | Admit: 2021-08-08 | Payer: Medicare Other | Source: Ambulatory Visit

## 2021-08-16 DIAGNOSIS — E7800 Pure hypercholesterolemia, unspecified: Secondary | ICD-10-CM | POA: Diagnosis not present

## 2021-08-16 DIAGNOSIS — I739 Peripheral vascular disease, unspecified: Secondary | ICD-10-CM | POA: Diagnosis not present

## 2021-08-16 DIAGNOSIS — E039 Hypothyroidism, unspecified: Secondary | ICD-10-CM | POA: Diagnosis not present

## 2021-08-16 DIAGNOSIS — I10 Essential (primary) hypertension: Secondary | ICD-10-CM | POA: Diagnosis not present

## 2021-08-16 DIAGNOSIS — E1351 Other specified diabetes mellitus with diabetic peripheral angiopathy without gangrene: Secondary | ICD-10-CM | POA: Diagnosis not present

## 2021-08-16 DIAGNOSIS — E1122 Type 2 diabetes mellitus with diabetic chronic kidney disease: Secondary | ICD-10-CM | POA: Diagnosis not present

## 2021-08-16 DIAGNOSIS — E1165 Type 2 diabetes mellitus with hyperglycemia: Secondary | ICD-10-CM | POA: Diagnosis not present

## 2021-08-16 DIAGNOSIS — E1142 Type 2 diabetes mellitus with diabetic polyneuropathy: Secondary | ICD-10-CM | POA: Diagnosis not present

## 2021-08-16 DIAGNOSIS — Z794 Long term (current) use of insulin: Secondary | ICD-10-CM | POA: Diagnosis not present

## 2021-08-16 DIAGNOSIS — N1832 Chronic kidney disease, stage 3b: Secondary | ICD-10-CM | POA: Diagnosis not present

## 2021-10-23 ENCOUNTER — Other Ambulatory Visit: Payer: Self-pay | Admitting: Internal Medicine

## 2021-10-23 ENCOUNTER — Ambulatory Visit
Admission: RE | Admit: 2021-10-23 | Discharge: 2021-10-23 | Disposition: A | Discharge status: Home / Self Care | Payer: Medicare Other | Source: Ambulatory Visit | Attending: Internal Medicine | Admitting: Internal Medicine

## 2021-10-23 DIAGNOSIS — M545 Low back pain, unspecified: Secondary | ICD-10-CM

## 2021-10-23 DIAGNOSIS — R0789 Other chest pain: Secondary | ICD-10-CM | POA: Diagnosis not present

## 2021-10-23 DIAGNOSIS — M5451 Vertebrogenic low back pain: Secondary | ICD-10-CM | POA: Diagnosis not present

## 2021-10-23 DIAGNOSIS — R0781 Pleurodynia: Secondary | ICD-10-CM | POA: Diagnosis not present

## 2021-11-19 DIAGNOSIS — B351 Tinea unguium: Secondary | ICD-10-CM | POA: Diagnosis not present

## 2021-11-19 DIAGNOSIS — I739 Peripheral vascular disease, unspecified: Secondary | ICD-10-CM | POA: Diagnosis not present

## 2021-11-19 DIAGNOSIS — M79675 Pain in left toe(s): Secondary | ICD-10-CM | POA: Diagnosis not present

## 2022-01-16 ENCOUNTER — Other Ambulatory Visit: Payer: Self-pay | Admitting: Internal Medicine

## 2022-01-16 ENCOUNTER — Ambulatory Visit
Admission: RE | Admit: 2022-01-16 | Discharge: 2022-01-16 | Disposition: A | Discharge status: Home / Self Care | Payer: Medicare Other | Source: Ambulatory Visit | Attending: Internal Medicine | Admitting: Internal Medicine

## 2022-01-16 DIAGNOSIS — M79602 Pain in left arm: Secondary | ICD-10-CM | POA: Diagnosis not present

## 2022-01-16 DIAGNOSIS — M5412 Radiculopathy, cervical region: Secondary | ICD-10-CM

## 2022-01-16 DIAGNOSIS — M542 Cervicalgia: Secondary | ICD-10-CM | POA: Diagnosis not present

## 2022-02-06 DIAGNOSIS — M6281 Muscle weakness (generalized): Secondary | ICD-10-CM | POA: Diagnosis not present

## 2022-02-06 DIAGNOSIS — G8929 Other chronic pain: Secondary | ICD-10-CM | POA: Diagnosis not present

## 2022-02-06 DIAGNOSIS — M5459 Other low back pain: Secondary | ICD-10-CM | POA: Diagnosis not present

## 2022-02-13 DIAGNOSIS — M5459 Other low back pain: Secondary | ICD-10-CM | POA: Diagnosis not present

## 2022-02-13 DIAGNOSIS — M6281 Muscle weakness (generalized): Secondary | ICD-10-CM | POA: Diagnosis not present

## 2022-02-13 DIAGNOSIS — G8929 Other chronic pain: Secondary | ICD-10-CM | POA: Diagnosis not present

## 2022-02-20 DIAGNOSIS — M5459 Other low back pain: Secondary | ICD-10-CM | POA: Diagnosis not present

## 2022-02-20 DIAGNOSIS — M6281 Muscle weakness (generalized): Secondary | ICD-10-CM | POA: Diagnosis not present

## 2022-02-20 DIAGNOSIS — G8929 Other chronic pain: Secondary | ICD-10-CM | POA: Diagnosis not present

## 2022-02-22 DIAGNOSIS — M5459 Other low back pain: Secondary | ICD-10-CM | POA: Diagnosis not present

## 2022-02-22 DIAGNOSIS — G8929 Other chronic pain: Secondary | ICD-10-CM | POA: Diagnosis not present

## 2022-02-22 DIAGNOSIS — M6281 Muscle weakness (generalized): Secondary | ICD-10-CM | POA: Diagnosis not present

## 2022-02-25 DIAGNOSIS — M6281 Muscle weakness (generalized): Secondary | ICD-10-CM | POA: Diagnosis not present

## 2022-02-25 DIAGNOSIS — G8929 Other chronic pain: Secondary | ICD-10-CM | POA: Diagnosis not present

## 2022-02-25 DIAGNOSIS — M5459 Other low back pain: Secondary | ICD-10-CM | POA: Diagnosis not present

## 2022-03-06 DIAGNOSIS — M5459 Other low back pain: Secondary | ICD-10-CM | POA: Diagnosis not present

## 2022-03-06 DIAGNOSIS — M6281 Muscle weakness (generalized): Secondary | ICD-10-CM | POA: Diagnosis not present

## 2022-03-06 DIAGNOSIS — G8929 Other chronic pain: Secondary | ICD-10-CM | POA: Diagnosis not present

## 2022-03-14 DIAGNOSIS — I739 Peripheral vascular disease, unspecified: Secondary | ICD-10-CM | POA: Diagnosis not present

## 2022-03-14 DIAGNOSIS — N1832 Chronic kidney disease, stage 3b: Secondary | ICD-10-CM | POA: Diagnosis not present

## 2022-03-14 DIAGNOSIS — I700 Atherosclerosis of aorta: Secondary | ICD-10-CM | POA: Diagnosis not present

## 2022-03-14 DIAGNOSIS — M8580 Other specified disorders of bone density and structure, unspecified site: Secondary | ICD-10-CM | POA: Diagnosis not present

## 2022-03-14 DIAGNOSIS — G629 Polyneuropathy, unspecified: Secondary | ICD-10-CM | POA: Diagnosis not present

## 2022-03-14 DIAGNOSIS — E782 Mixed hyperlipidemia: Secondary | ICD-10-CM | POA: Diagnosis not present

## 2022-03-14 DIAGNOSIS — E7800 Pure hypercholesterolemia, unspecified: Secondary | ICD-10-CM | POA: Diagnosis not present

## 2022-03-14 DIAGNOSIS — Z0000 Encounter for general adult medical examination without abnormal findings: Secondary | ICD-10-CM | POA: Diagnosis not present

## 2022-03-14 DIAGNOSIS — Z23 Encounter for immunization: Secondary | ICD-10-CM | POA: Diagnosis not present

## 2022-03-14 DIAGNOSIS — E11319 Type 2 diabetes mellitus with unspecified diabetic retinopathy without macular edema: Secondary | ICD-10-CM | POA: Diagnosis not present

## 2022-03-14 DIAGNOSIS — E1122 Type 2 diabetes mellitus with diabetic chronic kidney disease: Secondary | ICD-10-CM | POA: Diagnosis not present

## 2022-03-14 DIAGNOSIS — D649 Anemia, unspecified: Secondary | ICD-10-CM | POA: Diagnosis not present

## 2022-03-14 DIAGNOSIS — I10 Essential (primary) hypertension: Secondary | ICD-10-CM | POA: Diagnosis not present

## 2022-03-14 DIAGNOSIS — E039 Hypothyroidism, unspecified: Secondary | ICD-10-CM | POA: Diagnosis not present

## 2022-03-15 ENCOUNTER — Other Ambulatory Visit: Payer: Self-pay | Admitting: Internal Medicine

## 2022-03-15 DIAGNOSIS — M8580 Other specified disorders of bone density and structure, unspecified site: Secondary | ICD-10-CM

## 2022-03-19 DIAGNOSIS — D509 Iron deficiency anemia, unspecified: Secondary | ICD-10-CM | POA: Diagnosis not present

## 2022-03-25 ENCOUNTER — Other Ambulatory Visit: Payer: Self-pay | Admitting: Internal Medicine

## 2022-03-25 DIAGNOSIS — D509 Iron deficiency anemia, unspecified: Secondary | ICD-10-CM

## 2022-03-25 DIAGNOSIS — R194 Change in bowel habit: Secondary | ICD-10-CM

## 2022-04-15 ENCOUNTER — Other Ambulatory Visit: Payer: Self-pay | Admitting: Internal Medicine

## 2022-04-15 DIAGNOSIS — Z1231 Encounter for screening mammogram for malignant neoplasm of breast: Secondary | ICD-10-CM

## 2022-04-19 ENCOUNTER — Ambulatory Visit
Admission: RE | Admit: 2022-04-19 | Discharge: 2022-04-19 | Disposition: A | Discharge status: Home / Self Care | Payer: Medicare Other | Source: Ambulatory Visit | Attending: Internal Medicine | Admitting: Internal Medicine

## 2022-04-19 DIAGNOSIS — N281 Cyst of kidney, acquired: Secondary | ICD-10-CM | POA: Diagnosis not present

## 2022-04-19 DIAGNOSIS — I700 Atherosclerosis of aorta: Secondary | ICD-10-CM | POA: Diagnosis not present

## 2022-04-19 DIAGNOSIS — D649 Anemia, unspecified: Secondary | ICD-10-CM | POA: Diagnosis not present

## 2022-04-19 DIAGNOSIS — M4696 Unspecified inflammatory spondylopathy, lumbar region: Secondary | ICD-10-CM | POA: Diagnosis not present

## 2022-04-19 DIAGNOSIS — R194 Change in bowel habit: Secondary | ICD-10-CM

## 2022-04-19 DIAGNOSIS — D509 Iron deficiency anemia, unspecified: Secondary | ICD-10-CM

## 2022-04-19 MED ORDER — IOPAMIDOL (ISOVUE-300) INJECTION 61%
100.0000 mL | Freq: Once | INTRAVENOUS | Status: AC | PRN
  Administered 2022-04-19: 100 mL via INTRAVENOUS

## 2022-05-07 ENCOUNTER — Emergency Department (HOSPITAL_BASED_OUTPATIENT_CLINIC_OR_DEPARTMENT_OTHER): Payer: Medicare Other | Admitting: Radiology

## 2022-05-07 ENCOUNTER — Emergency Department (HOSPITAL_BASED_OUTPATIENT_CLINIC_OR_DEPARTMENT_OTHER)
Admission: EM | Admit: 2022-05-07 | Discharge: 2022-05-07 | Disposition: A | Discharge status: Home / Self Care | Payer: Medicare Other | Attending: Emergency Medicine | Admitting: Emergency Medicine

## 2022-05-07 ENCOUNTER — Encounter (HOSPITAL_BASED_OUTPATIENT_CLINIC_OR_DEPARTMENT_OTHER): Payer: Self-pay

## 2022-05-07 DIAGNOSIS — M549 Dorsalgia, unspecified: Secondary | ICD-10-CM | POA: Diagnosis present

## 2022-05-07 DIAGNOSIS — M5441 Lumbago with sciatica, right side: Secondary | ICD-10-CM | POA: Insufficient documentation

## 2022-05-07 DIAGNOSIS — G8929 Other chronic pain: Secondary | ICD-10-CM | POA: Insufficient documentation

## 2022-05-07 MED ORDER — HYDROCODONE-ACETAMINOPHEN 5-325 MG PO TABS: 1.0000 | ORAL_TABLET | Freq: Once | ORAL | Status: DC

## 2022-05-07 MED ORDER — HYDROCODONE-ACETAMINOPHEN 5-325 MG PO TABS: 2.0000 | ORAL_TABLET | ORAL | 0 refills | Status: DC | PRN

## 2022-05-07 MED ORDER — OXYCODONE-ACETAMINOPHEN 5-325 MG PO TABS
1.0000 | ORAL_TABLET | Freq: Once | ORAL | Status: AC
  Administered 2022-05-07: 1 via ORAL
  Filled 2022-05-07: qty 1

## 2022-05-07 MED ORDER — LIDOCAINE 5 % EX PTCH
2.0000 | MEDICATED_PATCH | CUTANEOUS | Status: DC
  Administered 2022-05-07: 2 via TRANSDERMAL
  Filled 2022-05-07: qty 2

## 2022-05-07 NOTE — ED Triage Notes (Signed)
Pt c/o lower back pain and spasms for the past week. States she has been taking robaxin with minimal relief.

## 2022-05-07 NOTE — Discharge Instructions (Addendum)
Today you were seen in the emergency department for your back pain.    In the emergency department you were offered x-rays and imaging but did not want to have them at this time.    At home, please take the Norco we have prescribed you for your pain.  Do not take this while taking tramadol or methocarbamol.  Do not take this medication prior to driving or operating heavy machinery since it can make you drowsy.    Check your MyChart online for the results of any tests that had not resulted by the time you left the emergency department.   Follow-up with your primary doctor in 2-3 days regarding your visit.    Return immediately to the emergency department if you experience any of the following: Worsening pain, fevers, vomiting, numbness or weakness of your legs that is new, bowel or bladder incontinence, or any other concerning symptoms.    Thank you for visiting our Emergency Department. It was a pleasure taking care of you today.

## 2022-05-07 NOTE — ED Notes (Signed)
Pt dc home with family. DC instructions given and reviewed. Opportunities given for questions. Pt verbalized understanding.

## 2022-05-07 NOTE — ED Provider Notes (Signed)
[place] EMERGENCY DEPT Provider Note   CSN: [id number] Arrival date & time: 05/07/22  1103     History {Add pertinent medical, surgical, social history, OB history to HPI:1} No chief complaint on file.   [name] is a 81 y.o. female.  81 year old female history of chronic back pain who presents to the emergency department with back pain.  Patient states that for the past week she has had sharp back pain on her right side.  Says it is worsened with movement.  No injuries noted.  No dysuria or frequency.  No fevers or nausea or vomiting.  No personal history of cancer and is not on blood thinners.  Has been trying Tylenol and methocarbamol but states that is still been severe so she came into the emergency department.  Denies any bowel or bladder incontinence or new lower extremity weakness or numbness.   Past Medical History:  Diagnosis Date   Cataract    Diabetes mellitus    TYPE 2    Fibromyalgia    Goiter    Hyperlipidemia    Hypertension    Neuropathy    Tobacco dependence       Home Medications Prior to Admission medications   Medication Sig Start Date End Date Taking? Authorizing Provider  acetaminophen (TYLENOL) 500 MG tablet Take 500 mg by mouth every 6 (six) hours as needed for mild pain or headache.     [provider]  amLODipine (NORVASC) 10 MG tablet Take 10 mg by mouth daily.    [provider]  amLODipine (NORVASC) 5 MG tablet Take 5 mg by mouth daily. 04/13/22   [provider]  aspirin EC 81 MG tablet Take 81 mg by mouth daily.    [provider]  atorvastatin (LIPITOR) 10 MG tablet Take 10 mg by mouth daily.    [provider]  calcitRIOL (ROCALTROL) 0.25 MCG capsule Take 1 capsule (0.25 mcg total) by mouth 2 (two) times daily. 08/06/17   Armandina Gemma, MD  calcium carbonate (TUMS) 500 MG chewable tablet Chew 3 tablets (600 mg of elemental calcium total) by mouth 4 (four) times daily -  before  meals and at bedtime. Patient not taking: Reported on 03/06/2021 08/06/17   Armandina Gemma, MD  HUMALOG MIX 75/25 KWIKPEN (75-25) 100 UNIT/ML KwikPen Inject into the skin. 12/03/21   [provider]  insulin NPH-regular Human (NOVOLIN 70/30) (70-30) 100 UNIT/ML injection Inject 24 Units into the skin 2 (two) times daily with a meal.     [provider]  levothyroxine (SYNTHROID, LEVOTHROID) 100 MCG tablet Take 100 mcg by mouth daily before breakfast.     [provider]  meclizine (ANTIVERT) 25 MG tablet Take 1 tablet (25 mg total) by mouth 3 (three) times daily as needed for dizziness. 06/15/16   Rancour, Annie Main, MD  metFORMIN (GLUCOPHAGE-XR) 500 MG 24 hr tablet Take 500 mg by mouth 2 (two) times daily. 04/29/22   [provider]  methocarbamol (ROBAXIN) 500 MG tablet Take 1 tablet (500 mg total) by mouth 2 (two) times daily. 10/25/13   Piepenbrink, Anderson Malta, PA-C  nortriptyline (PAMELOR) 25 MG capsule TAKE 1 CAPSULE BY MOUTH AT BEDTIME Patient not taking: No sig reported 02/09/14   Marcial Pacas, MD  ondansetron (ZOFRAN) 4 MG tablet Take 1 tablet (4 mg total) by mouth every 6 (six) hours. 06/15/16   Rancour, Annie Main, MD  traMADol (ULTRAM) 50 MG tablet Take 1-2 tablets (50-100 mg total) by mouth every 6 (  six) hours as needed for moderate pain (mild pain not relieved by tylenol). 08/06/17   Armandina Gemma, MD  TRULICITY 1.5 FM/3.8GY SOPN Inject into the skin. 04/13/22   [provider]  valsartan-hydrochlorothiazide (DIOVAN-HCT) 160-12.5 MG per tablet Take 1 tablet by mouth daily.    [provider]      Allergies    Metformin and related    Review of Systems   Review of Systems  Physical Exam Updated Vital Signs BP (!) 166/80   Pulse 68   Temp 98.7 F (37.1 C)   Resp 17   Ht 5\' 6"  (1.676 m)   Wt 90.7 kg   SpO2 99%   BMI 32.28 kg/m  Physical Exam Vitals and nursing note reviewed.  Constitutional:      General: She is not in acute distress.     Appearance: She is well-developed.  HENT:     Head: Normocephalic and atraumatic.     Right Ear: External ear normal.     Left Ear: External ear normal.     Nose: Nose normal.  Eyes:     Extraocular Movements: Extraocular movements intact.     Conjunctiva/sclera: Conjunctivae normal.     Pupils: Pupils are equal, round, and reactive to light.  Cardiovascular:     Rate and Rhythm: Normal rate and regular rhythm.     Heart sounds: No murmur heard. Pulmonary:     Effort: Pulmonary effort is normal. No respiratory distress.  Abdominal:     General: Abdomen is flat.  Musculoskeletal:        General: No swelling.     Cervical back: Normal range of motion and neck supple.     Right lower leg: No edema.     Left lower leg: No edema.  Skin:    General: Skin is warm and dry.     Capillary Refill: Capillary refill takes less than 2 seconds.  Neurological:     Mental Status: She is alert and oriented to person, place, and time. Mental status is at baseline.     Comments: No spinal midline TTP in cervical, thoracic, or lumbar spine.  Right paraspinal tenderness to palpation at approximately L4.  Increased muscle tone and small knot palpated.  No stepoffs noted.   Motor: Muscle bulk and tone are normal. Strength is 5/5 in hip flexion, knee flexion and extension, ankle dorsiflexion and plantar flexion bilaterally. Full strength of great toe dorsiflexion bilaterally.  Sensory: Intact sensation to light touch in L2 though S1 dermatomes bilaterally.   Psychiatric:        Mood and Affect: Mood normal.     ED Results / Procedures / Treatments   Labs (all labs ordered are listed, but only abnormal results are displayed) Labs Reviewed - No data to display  EKG None  Radiology No results found.  Procedures Procedures  {Document cardiac monitor, telemetry assessment procedure when appropriate:1}  Medications Ordered in ED Medications - No data to display  ED Course/ Medical Decision  Making/ A&P Clinical Course as of 05/07/22 1748  Tue May 07, 2022  1748 Patient refusing lidocaine patch and x-ray at this time. [RP]    Clinical Course User Index [RP] Fransico Meadow, MD                           Medical Decision Making Amount and/or Complexity of Data Reviewed Radiology: ordered.  Risk Prescription drug management.   ***  {  Document critical care time when appropriate:1} {Document review of labs and clinical decision tools ie heart score, Chads2Vasc2 etc:1}  {Document your independent review of radiology images, and any outside records:1} {Document your discussion with family members, caretakers, and with consultants:1} {Document social determinants of health affecting pt's care:1} {Document your decision making why or why not admission, treatments were needed:1} Final Clinical Impression(s) / ED Diagnoses Final diagnoses:  None    Rx / DC Orders ED Discharge Orders     None

## 2022-05-09 DIAGNOSIS — E11319 Type 2 diabetes mellitus with unspecified diabetic retinopathy without macular edema: Secondary | ICD-10-CM | POA: Diagnosis not present

## 2022-05-09 DIAGNOSIS — I10 Essential (primary) hypertension: Secondary | ICD-10-CM | POA: Diagnosis not present

## 2022-05-09 DIAGNOSIS — E039 Hypothyroidism, unspecified: Secondary | ICD-10-CM | POA: Diagnosis not present

## 2022-05-09 DIAGNOSIS — M8580 Other specified disorders of bone density and structure, unspecified site: Secondary | ICD-10-CM | POA: Diagnosis not present

## 2022-05-09 DIAGNOSIS — E782 Mixed hyperlipidemia: Secondary | ICD-10-CM | POA: Diagnosis not present

## 2022-05-09 DIAGNOSIS — N1832 Chronic kidney disease, stage 3b: Secondary | ICD-10-CM | POA: Diagnosis not present

## 2022-05-13 DIAGNOSIS — M549 Dorsalgia, unspecified: Secondary | ICD-10-CM | POA: Diagnosis not present

## 2022-05-17 DIAGNOSIS — M62838 Other muscle spasm: Secondary | ICD-10-CM | POA: Diagnosis not present

## 2022-05-17 DIAGNOSIS — M5459 Other low back pain: Secondary | ICD-10-CM | POA: Diagnosis not present

## 2022-05-17 DIAGNOSIS — G8929 Other chronic pain: Secondary | ICD-10-CM | POA: Diagnosis not present

## 2022-05-29 ENCOUNTER — Ambulatory Visit: Payer: Medicare Other

## 2022-06-04 ENCOUNTER — Ambulatory Visit: Payer: Medicare Other

## 2022-06-04 DIAGNOSIS — M62838 Other muscle spasm: Secondary | ICD-10-CM | POA: Diagnosis not present

## 2022-06-04 DIAGNOSIS — M5459 Other low back pain: Secondary | ICD-10-CM | POA: Diagnosis not present

## 2022-06-04 DIAGNOSIS — G8929 Other chronic pain: Secondary | ICD-10-CM | POA: Diagnosis not present

## 2022-06-06 DIAGNOSIS — M5459 Other low back pain: Secondary | ICD-10-CM | POA: Diagnosis not present

## 2022-06-06 DIAGNOSIS — G8929 Other chronic pain: Secondary | ICD-10-CM | POA: Diagnosis not present

## 2022-06-06 DIAGNOSIS — M62838 Other muscle spasm: Secondary | ICD-10-CM | POA: Diagnosis not present

## 2022-06-11 DIAGNOSIS — M5459 Other low back pain: Secondary | ICD-10-CM | POA: Diagnosis not present

## 2022-06-11 DIAGNOSIS — M62838 Other muscle spasm: Secondary | ICD-10-CM | POA: Diagnosis not present

## 2022-06-11 DIAGNOSIS — G8929 Other chronic pain: Secondary | ICD-10-CM | POA: Diagnosis not present

## 2022-06-13 DIAGNOSIS — G8929 Other chronic pain: Secondary | ICD-10-CM | POA: Diagnosis not present

## 2022-06-13 DIAGNOSIS — M5459 Other low back pain: Secondary | ICD-10-CM | POA: Diagnosis not present

## 2022-06-13 DIAGNOSIS — M62838 Other muscle spasm: Secondary | ICD-10-CM | POA: Diagnosis not present

## 2022-06-20 DIAGNOSIS — H35363 Drusen (degenerative) of macula, bilateral: Secondary | ICD-10-CM | POA: Diagnosis not present

## 2022-06-20 DIAGNOSIS — H0102B Squamous blepharitis left eye, upper and lower eyelids: Secondary | ICD-10-CM | POA: Diagnosis not present

## 2022-06-20 DIAGNOSIS — Z961 Presence of intraocular lens: Secondary | ICD-10-CM | POA: Diagnosis not present

## 2022-06-20 DIAGNOSIS — E119 Type 2 diabetes mellitus without complications: Secondary | ICD-10-CM | POA: Diagnosis not present

## 2022-06-20 DIAGNOSIS — H0102A Squamous blepharitis right eye, upper and lower eyelids: Secondary | ICD-10-CM | POA: Diagnosis not present

## 2022-06-20 DIAGNOSIS — H1045 Other chronic allergic conjunctivitis: Secondary | ICD-10-CM | POA: Diagnosis not present

## 2022-06-20 DIAGNOSIS — H35372 Puckering of macula, left eye: Secondary | ICD-10-CM | POA: Diagnosis not present

## 2022-06-20 DIAGNOSIS — H04123 Dry eye syndrome of bilateral lacrimal glands: Secondary | ICD-10-CM | POA: Diagnosis not present

## 2022-06-20 DIAGNOSIS — H40053 Ocular hypertension, bilateral: Secondary | ICD-10-CM | POA: Diagnosis not present

## 2022-06-21 DIAGNOSIS — G8929 Other chronic pain: Secondary | ICD-10-CM | POA: Diagnosis not present

## 2022-06-21 DIAGNOSIS — M62838 Other muscle spasm: Secondary | ICD-10-CM | POA: Diagnosis not present

## 2022-06-21 DIAGNOSIS — M5459 Other low back pain: Secondary | ICD-10-CM | POA: Diagnosis not present

## 2022-06-28 DIAGNOSIS — M62838 Other muscle spasm: Secondary | ICD-10-CM | POA: Diagnosis not present

## 2022-06-28 DIAGNOSIS — G8929 Other chronic pain: Secondary | ICD-10-CM | POA: Diagnosis not present

## 2022-06-28 DIAGNOSIS — M5459 Other low back pain: Secondary | ICD-10-CM | POA: Diagnosis not present

## 2022-07-08 DIAGNOSIS — B351 Tinea unguium: Secondary | ICD-10-CM | POA: Diagnosis not present

## 2022-07-12 ENCOUNTER — Ambulatory Visit (INDEPENDENT_AMBULATORY_CARE_PROVIDER_SITE_OTHER)
Admission: EM | Admit: 2022-07-12 | Discharge: 2022-07-12 | Disposition: A | Discharge status: Home / Self Care | Payer: Medicare Other | Source: Home / Self Care

## 2022-07-12 ENCOUNTER — Encounter (HOSPITAL_COMMUNITY): Payer: Self-pay | Admitting: *Deleted

## 2022-07-12 ENCOUNTER — Telehealth (HOSPITAL_COMMUNITY): Payer: Self-pay | Admitting: Physician Assistant

## 2022-07-12 ENCOUNTER — Inpatient Hospital Stay (HOSPITAL_COMMUNITY)
Admission: EM | Admit: 2022-07-12 | Discharge: 2022-07-15 | DRG: 812 | Disposition: A | Discharge status: Home Health | Payer: Medicare Other | Attending: Internal Medicine | Admitting: Internal Medicine

## 2022-07-12 ENCOUNTER — Other Ambulatory Visit: Payer: Self-pay

## 2022-07-12 DIAGNOSIS — D62 Acute posthemorrhagic anemia: Secondary | ICD-10-CM | POA: Diagnosis not present

## 2022-07-12 DIAGNOSIS — Z7985 Long-term (current) use of injectable non-insulin antidiabetic drugs: Secondary | ICD-10-CM

## 2022-07-12 DIAGNOSIS — Z87891 Personal history of nicotine dependence: Secondary | ICD-10-CM | POA: Diagnosis not present

## 2022-07-12 DIAGNOSIS — G8929 Other chronic pain: Secondary | ICD-10-CM | POA: Diagnosis present

## 2022-07-12 DIAGNOSIS — M545 Low back pain, unspecified: Secondary | ICD-10-CM | POA: Diagnosis present

## 2022-07-12 DIAGNOSIS — R5381 Other malaise: Secondary | ICD-10-CM

## 2022-07-12 DIAGNOSIS — Z5320 Procedure and treatment not carried out because of patient's decision for unspecified reasons: Secondary | ICD-10-CM | POA: Diagnosis not present

## 2022-07-12 DIAGNOSIS — R195 Other fecal abnormalities: Secondary | ICD-10-CM | POA: Diagnosis not present

## 2022-07-12 DIAGNOSIS — Z888 Allergy status to other drugs, medicaments and biological substances status: Secondary | ICD-10-CM | POA: Diagnosis not present

## 2022-07-12 DIAGNOSIS — M791 Myalgia, unspecified site: Secondary | ICD-10-CM | POA: Insufficient documentation

## 2022-07-12 DIAGNOSIS — E669 Obesity, unspecified: Secondary | ICD-10-CM | POA: Diagnosis present

## 2022-07-12 DIAGNOSIS — E1151 Type 2 diabetes mellitus with diabetic peripheral angiopathy without gangrene: Secondary | ICD-10-CM | POA: Diagnosis present

## 2022-07-12 DIAGNOSIS — E1142 Type 2 diabetes mellitus with diabetic polyneuropathy: Secondary | ICD-10-CM | POA: Diagnosis present

## 2022-07-12 DIAGNOSIS — D649 Anemia, unspecified: Secondary | ICD-10-CM | POA: Diagnosis not present

## 2022-07-12 DIAGNOSIS — Z79890 Hormone replacement therapy (postmenopausal): Secondary | ICD-10-CM

## 2022-07-12 DIAGNOSIS — Z7982 Long term (current) use of aspirin: Secondary | ICD-10-CM | POA: Diagnosis not present

## 2022-07-12 DIAGNOSIS — E1165 Type 2 diabetes mellitus with hyperglycemia: Secondary | ICD-10-CM | POA: Diagnosis present

## 2022-07-12 DIAGNOSIS — E1169 Type 2 diabetes mellitus with other specified complication: Secondary | ICD-10-CM

## 2022-07-12 DIAGNOSIS — Z79899 Other long term (current) drug therapy: Secondary | ICD-10-CM | POA: Diagnosis not present

## 2022-07-12 DIAGNOSIS — N644 Mastodynia: Secondary | ICD-10-CM

## 2022-07-12 DIAGNOSIS — B372 Candidiasis of skin and nail: Secondary | ICD-10-CM

## 2022-07-12 DIAGNOSIS — E039 Hypothyroidism, unspecified: Secondary | ICD-10-CM | POA: Diagnosis present

## 2022-07-12 DIAGNOSIS — E785 Hyperlipidemia, unspecified: Secondary | ICD-10-CM | POA: Diagnosis not present

## 2022-07-12 DIAGNOSIS — E876 Hypokalemia: Secondary | ICD-10-CM | POA: Diagnosis not present

## 2022-07-12 DIAGNOSIS — I10 Essential (primary) hypertension: Secondary | ICD-10-CM | POA: Diagnosis not present

## 2022-07-12 DIAGNOSIS — M797 Fibromyalgia: Secondary | ICD-10-CM | POA: Diagnosis not present

## 2022-07-12 DIAGNOSIS — Z6832 Body mass index (BMI) 32.0-32.9, adult: Secondary | ICD-10-CM | POA: Diagnosis not present

## 2022-07-12 DIAGNOSIS — Z833 Family history of diabetes mellitus: Secondary | ICD-10-CM

## 2022-07-12 DIAGNOSIS — R5383 Other fatigue: Secondary | ICD-10-CM

## 2022-07-12 LAB — COMPREHENSIVE METABOLIC PANEL
ALT: 10 U/L (ref 0–44)
ALT: 10 U/L (ref 0–44)
AST: 17 U/L (ref 15–41)
AST: 16 U/L (ref 15–41)
Albumin: 3.6 g/dL (ref 3.5–5.0)
Albumin: 3.4 g/dL — ABNORMAL LOW (ref 3.5–5.0)
Alkaline Phosphatase: 85 U/L (ref 38–126)
Alkaline Phosphatase: 87 U/L (ref 38–126)
Anion gap: 10 (ref 5–15)
Anion gap: 9 (ref 5–15)
BUN: 17 mg/dL (ref 8–23)
BUN: 18 mg/dL (ref 8–23)
CO2: 27 mmol/L (ref 22–32)
CO2: 26 mmol/L (ref 22–32)
Calcium: 8.6 mg/dL — ABNORMAL LOW (ref 8.9–10.3)
Calcium: 8.7 mg/dL — ABNORMAL LOW (ref 8.9–10.3)
Chloride: 107 mmol/L (ref 98–111)
Chloride: 106 mmol/L (ref 98–111)
Creatinine, Ser: 0.98 mg/dL (ref 0.44–1.00)
Creatinine, Ser: 1.11 mg/dL — ABNORMAL HIGH (ref 0.44–1.00)
GFR, Estimated: 58 mL/min — ABNORMAL LOW (ref >60)
GFR, Estimated: 50 mL/min — ABNORMAL LOW (ref >60)
Glucose, Bld: 125 mg/dL — ABNORMAL HIGH (ref 70–99)
Glucose, Bld: 271 mg/dL — ABNORMAL HIGH (ref 70–99)
Potassium: 3.4 mmol/L — ABNORMAL LOW (ref 3.5–5.1)
Potassium: 3.8 mmol/L (ref 3.5–5.1)
Sodium: 144 mmol/L (ref 135–145)
Sodium: 141 mmol/L (ref 135–145)
Total Bilirubin: 0.6 mg/dL (ref 0.3–1.2)
Total Bilirubin: 0.6 mg/dL (ref 0.3–1.2)
Total Protein: 7.9 g/dL (ref 6.5–8.1)
Total Protein: 7.6 g/dL (ref 6.5–8.1)

## 2022-07-12 LAB — CBC WITH DIFFERENTIAL/PLATELET
Abs Immature Granulocytes: 0.04 10*3/uL (ref 0.00–0.07)
Abs Immature Granulocytes: 0.04 10*3/uL (ref 0.00–0.07)
Basophils Absolute: 0 10*3/uL (ref 0.0–0.1)
Basophils Absolute: 0 10*3/uL (ref 0.0–0.1)
Basophils Relative: 1 %
Basophils Relative: 1 %
Eosinophils Absolute: 0.1 10*3/uL (ref 0.0–0.5)
Eosinophils Absolute: 0.1 10*3/uL (ref 0.0–0.5)
Eosinophils Relative: 2 %
Eosinophils Relative: 1 %
HCT: 25.3 % — ABNORMAL LOW (ref 36.0–46.0)
HCT: 23.6 % — ABNORMAL LOW (ref 36.0–46.0)
Hemoglobin: 6.6 g/dL — CL (ref 12.0–15.0)
Hemoglobin: 6.4 g/dL — CL (ref 12.0–15.0)
Immature Granulocytes: 1 %
Immature Granulocytes: 1 %
Lymphocytes Relative: 32 %
Lymphocytes Relative: 27 %
Lymphs Abs: 2.1 10*3/uL (ref 0.7–4.0)
Lymphs Abs: 2.3 10*3/uL (ref 0.7–4.0)
MCH: 15.5 pg — ABNORMAL LOW (ref 26.0–34.0)
MCH: 15.9 pg — ABNORMAL LOW (ref 26.0–34.0)
MCHC: 26.1 g/dL — ABNORMAL LOW (ref 30.0–36.0)
MCHC: 27.1 g/dL — ABNORMAL LOW (ref 30.0–36.0)
MCV: 59.5 fL — ABNORMAL LOW (ref 80.0–100.0)
MCV: 58.7 fL — ABNORMAL LOW (ref 80.0–100.0)
Monocytes Absolute: 0.5 10*3/uL (ref 0.1–1.0)
Monocytes Absolute: 0.6 10*3/uL (ref 0.1–1.0)
Monocytes Relative: 7 %
Monocytes Relative: 7 %
Neutro Abs: 3.8 10*3/uL (ref 1.7–7.7)
Neutro Abs: 5.5 10*3/uL (ref 1.7–7.7)
Neutrophils Relative %: 57 %
Neutrophils Relative %: 63 %
Platelets: 445 10*3/uL — ABNORMAL HIGH (ref 150–400)
Platelets: 425 10*3/uL — ABNORMAL HIGH (ref 150–400)
RBC: 4.25 MIL/uL (ref 3.87–5.11)
RBC: 4.02 MIL/uL (ref 3.87–5.11)
RDW: 23.9 % — ABNORMAL HIGH (ref 11.5–15.5)
RDW: 23.9 % — ABNORMAL HIGH (ref 11.5–15.5)
WBC: 6.5 10*3/uL (ref 4.0–10.5)
WBC: 8.6 10*3/uL (ref 4.0–10.5)
nRBC: 0 % (ref 0.0–0.2)
nRBC: 0 % (ref 0.0–0.2)

## 2022-07-12 LAB — POCT URINALYSIS DIPSTICK, ED / UC
Bilirubin Urine: NEGATIVE
Glucose, UA: NEGATIVE mg/dL
Hgb urine dipstick: NEGATIVE
Ketones, ur: NEGATIVE mg/dL
Leukocytes,Ua: NEGATIVE
Nitrite: NEGATIVE
Protein, ur: NEGATIVE mg/dL
Specific Gravity, Urine: 1.015 (ref 1.005–1.030)
Urobilinogen, UA: 0.2 mg/dL (ref 0.0–1.0)
pH: 5.5 (ref 5.0–8.0)

## 2022-07-12 LAB — PROTIME-INR
INR: 1 (ref 0.8–1.2)
Prothrombin Time: 13 seconds (ref 11.4–15.2)

## 2022-07-12 LAB — CK: Total CK: 113 U/L (ref 38–234)

## 2022-07-12 LAB — ABO/RH: ABO/RH(D): O POS

## 2022-07-12 MED ORDER — TIZANIDINE HCL 2 MG PO TABS: 2.0000 mg | ORAL_TABLET | Freq: Every evening | ORAL | 0 refills | Status: DC | PRN

## 2022-07-12 MED ORDER — CICLOPIROX OLAMINE 0.77 % EX CREA: TOPICAL_CREAM | Freq: Two times a day (BID) | CUTANEOUS | 0 refills | Status: DC

## 2022-07-12 MED ORDER — LIDOCAINE 4 % EX PTCH: 1.0000 | MEDICATED_PATCH | CUTANEOUS | 0 refills | Status: AC

## 2022-07-12 NOTE — ED Triage Notes (Signed)
Pt states she has whole body aches x 2 weeks. She said she is anemic so she feels like that's the issue. She is going to PT and that helps but the pain comes right back. She said her skin is burning.   She complains of back pain previous dx is sciatica.   She felt a knot on her right breast, she complains of shooting pain. She states she has an appt with the breast center on 07/29/2021.  She has a Therapist, sports from her insurance come yesterday at home and they advised her to see a doctor but she looked good.

## 2022-07-12 NOTE — ED Provider Notes (Addendum)
[place]    CSN: [id number] Arrival date & time: 07/12/22  1259      History   Chief Complaint Chief Complaint  Patient presents with   Generalized Body Aches    HPI Diane Knight is a 82 y.o. female.   Patient presents today with several concerns.  She reports a 2-week history of widespread body pain that is worse in her extremities.  She reports that pain has improved recently but was originally rated 10 on a 0-10 pain scale, described as intense aching, worse with movement, no limiting factors identified.  She denies any change in her activity or known injury prior to symptom onset.  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  She has been taking Tylenol without improvement of symptoms.  Denies any cough, congestion, fever, nausea, vomiting, urinary symptoms.  She does have a history of sciatica and has undergone interfacet injections with only temporary relief of symptoms.  Reports that she continues to have ongoing back pain but pain in her extremities is new and different than her baseline.  She does have a history of anemia and wonders if this could be contributing to her symptoms.  Denies any known blood loss including melena or hematochezia.  She denies any chest pain, palpitations, dizziness, lightheadedness, syncope.  In addition, patient reports a prolonged history of right breast pain.  She is scheduled for a mammogram in the near future but reports that the pain has gotten worse.  She denies any inverted nipple, nipple discharge, skin changes.  She thought she felt a mass but has been unable to find it again.  Reports her last mammogram was several years ago and was normal.  Denies personal or family history of breast cancer though she did recently lose her daughter to liver cancer 2 months ago.    Past Medical History:  Diagnosis Date   Cataract    Diabetes mellitus    TYPE 2    Fibromyalgia     Goiter    Hyperlipidemia    Hypertension    Neuropathy    Tobacco dependence     Patient Active Problem List   Diagnosis Date Noted   Thyroid goiter 08/03/2017   DM (diabetes mellitus) ([place]) 12/03/2012   Peripheral neuropathy 12/03/2012   Fibromyalgia     Past Surgical History:  Procedure Laterality Date   ABDOMINAL HYSTERECTOMY     EYE SURGERY     THYROIDECTOMY N/A 08/04/2017   Procedure: TOTAL THYROIDECTOMY;  Surgeon: Armandina Gemma, MD;  Location: [facility];  Service: General;  Laterality: N/A;    OB History   No obstetric history on file.      Home Medications    Prior to Admission medications   Medication Sig Start Date End Date Taking? Authorizing Provider  acetaminophen (TYLENOL) 500 MG tablet Take 500 mg by mouth every 6 (six) hours as needed for mild pain or headache.    Yes [provider]  amLODipine (NORVASC) 10 MG tablet Take 10 mg by mouth daily.   Yes [provider]  amLODipine (NORVASC) 5 MG tablet Take 5 mg by mouth daily. 04/13/22  Yes [provider]  aspirin EC 81 MG tablet Take 81 mg by mouth daily.   Yes [provider]  atorvastatin (LIPITOR) 10 MG tablet Take 10 mg by mouth daily.   Yes [provider]  calcitRIOL (ROCALTROL) 0.25 MCG capsule Take 1 capsule (  0.25 mcg total) by mouth 2 (two) times daily. 08/06/17  Yes Darnell Level, MD  calcium carbonate (TUMS) 500 MG chewable tablet Chew 3 tablets (600 mg of elemental calcium total) by mouth 4 (four) times daily -  before meals and at bedtime. 08/06/17  Yes Darnell Level, MD  ciclopirox (LOPROX) 0.77 % cream Apply topically 2 (two) times daily. 07/12/22  Yes Amelianna Meller K, PA-C  HUMALOG MIX 75/25 KWIKPEN (75-25) 100 UNIT/ML KwikPen Inject into the skin. 12/03/21  Yes [provider]  insulin NPH-regular Human (NOVOLIN 70/30) (70-30) 100 UNIT/ML injection Inject 24 Units into the skin 2 (two) times daily with a meal.    Yes [provider]   levothyroxine (SYNTHROID, LEVOTHROID) 100 MCG tablet Take 100 mcg by mouth daily before breakfast.    Yes [provider]  lidocaine (HM LIDOCAINE PATCH) 4 % Place 1 patch onto the skin daily. 07/12/22  Yes Severina Sykora, Noberto Retort, PA-C  meclizine (ANTIVERT) 25 MG tablet Take 1 tablet (25 mg total) by mouth 3 (three) times daily as needed for dizziness. 06/15/16  Yes Rancour, Jeannett Senior, MD  metFORMIN (GLUCOPHAGE-XR) 500 MG 24 hr tablet Take 500 mg by mouth 2 (two) times daily. 04/29/22  Yes [provider]  nortriptyline (PAMELOR) 25 MG capsule TAKE 1 CAPSULE BY MOUTH AT BEDTIME 02/09/14  Yes Levert Feinstein, MD  ondansetron (ZOFRAN) 4 MG tablet Take 1 tablet (4 mg total) by mouth every 6 (six) hours. 06/15/16  Yes Rancour, Jeannett Senior, MD  tiZANidine (ZANAFLEX) 2 MG tablet Take 1 tablet (2 mg total) by mouth at bedtime as needed for muscle spasms. 07/12/22  Yes Lonette Stevison K, PA-C  TRULICITY 1.5 MG/0.5ML SOPN Inject into the skin. 04/13/22  Yes [provider]  valsartan-hydrochlorothiazide (DIOVAN-HCT) 160-12.5 MG per tablet Take 1 tablet by mouth daily.   Yes [provider]    Family History Family History  Problem Relation Age of Onset   Diabetes Mother    High blood pressure Father     Social History Social History   Tobacco Use   Smoking status: Former    Packs/day: 0.30    Types: Cigarettes   Smokeless tobacco: Never  Substance Use Topics   Alcohol use: No   Drug use: No     Allergies   Metformin and related   Review of Systems Review of Systems  Constitutional:  Positive for activity change and fatigue. Negative for appetite change and fever.  HENT:  Negative for congestion, sinus pressure, sneezing and sore throat.   Respiratory:  Negative for cough and shortness of breath.   Cardiovascular:  Negative for chest pain.  Gastrointestinal:  Negative for abdominal pain, diarrhea, nausea and vomiting.  Genitourinary:  Negative for dysuria, frequency and  urgency.  Musculoskeletal:  Positive for arthralgias, back pain and myalgias.  Neurological:  Negative for dizziness, light-headedness and headaches.     Physical Exam Triage Vital Signs ED Triage Vitals  Enc Vitals Group     BP 07/12/22 1414 128/64     Pulse Rate 07/12/22 1414 70     Resp 07/12/22 1414 18     Temp 07/12/22 1414 98.2 F (36.8 C)     Temp Source 07/12/22 1414 Oral     SpO2 07/12/22 1414 98 %     Weight --      Height --      Head Circumference --      Peak Flow --      Pain Score 07/12/22  1412 9     Pain Loc --      Pain Edu? --      Excl. in GC? --    No data found.  Updated Vital Signs BP 128/64 (BP Location: Right Arm)   Pulse 70   Temp 98.2 F (36.8 C) (Oral)   Resp 18   SpO2 98%   Visual Acuity Right Eye Distance:   Left Eye Distance:   Bilateral Distance:    Right Eye Near:   Left Eye Near:    Bilateral Near:     Physical Exam Vitals reviewed.  Constitutional:      General: She is awake. She is not in acute distress.    Appearance: Normal appearance. She is well-developed. She is not ill-appearing.     Comments: Very pleasant female appears stated age in no acute distress sitting comfortably in exam room  HENT:     Head: Normocephalic and atraumatic.     Right Ear: Tympanic membrane, ear canal and external ear normal. Tympanic membrane is not erythematous or bulging.     Left Ear: Tympanic membrane, ear canal and external ear normal. Tympanic membrane is not erythematous or bulging.     Nose:     Right Sinus: No maxillary sinus tenderness or frontal sinus tenderness.     Left Sinus: No maxillary sinus tenderness or frontal sinus tenderness.     Mouth/Throat:     Pharynx: Uvula midline. No oropharyngeal exudate or posterior oropharyngeal erythema.  Cardiovascular:     Rate and Rhythm: Normal rate and regular rhythm.     Heart sounds: Normal heart sounds, S1 normal and S2 normal. No murmur heard. Pulmonary:     Effort: Pulmonary  effort is normal.     Breath sounds: Normal breath sounds. No wheezing, rhonchi or rales.     Comments: Clear to auscultation bilaterally Chest:  Breasts:    Right: Tenderness present. No swelling, inverted nipple or mass.     Left: No swelling, inverted nipple, mass or tenderness.  Abdominal:     General: Bowel sounds are normal.     Palpations: Abdomen is soft.     Tenderness: There is no abdominal tenderness.  Musculoskeletal:     Comments: Strength 4/5 bilateral upper and lower extremities.  Skin:    Findings: Rash present. Rash is macular and papular.     Comments: Maculopapular rash with some plaques noted inferior mammary region.  Psychiatric:        Behavior: Behavior is cooperative.      UC Treatments / Results  Labs (all labs ordered are listed, but only abnormal results are displayed) Labs Reviewed  CBC WITH DIFFERENTIAL/PLATELET  COMPREHENSIVE METABOLIC PANEL  CK  POCT URINALYSIS DIPSTICK, ED / UC    EKG   Radiology No results found.  Procedures Procedures (including critical care time)  Medications Ordered in UC Medications - No data to display  Initial Impression / Assessment and Plan / UC Course  I have reviewed the triage vital signs and the nursing notes.  Pertinent labs & imaging results that were available during my care of the patient were reviewed by me and considered in my medical decision making (see chart for details).     Patient is well-appearing, afebrile, nontoxic, nontachycardic.  UA was obtained and was normal.  Suspect widespread myalgias are related to underlying fibromyalgia.  She was encouraged to continue her Tylenol as well as gentle stretch.  She was given a prescription for Zanaflex 2  mg to be used at night to help with her symptoms but discussed that this can be sedating and she needs to be careful not to fall while on this medication.  She should take it right before she goes to bed.  CBC, CMP, CK was obtained today-results  pending.  Will make additional recommendations based on laboratory findings.  She was also given a lidocaine patch to help with lower back pain.  Recommended close follow-up with her primary care.  Discussed that if she has any changing or worsening symptoms including additional pain, fever, nausea/vomiting interfrontal intake, weakness she needs to be seen immediately for further evaluation and management.  Stat ultrasound and mammogram was ordered to investigate breast pain.  She does have a imaging study ordered and scheduled for 07/29/2022 but does not have an ultrasound ordered with this.  Additional orders were placed in case this could be moved up in case any additional orders were needed.  We discussed that if she has any changing or worsening symptoms including increasing pain, nipple discharge, recurrent mass, fever, skin changes she needs to be seen immediately.  Rash is consistent with yeast likely related to uncontrolled diabetes.  Loprox sent with instruction to apply this twice daily.  She is to keep area clean and if she has any spread of rash or additional symptoms she is to return for reevaluation.  Final Clinical Impressions(s) / UC Diagnoses   Final diagnoses:  Myalgia  Malaise and fatigue  Chronic bilateral low back pain without sciatica  Breast pain, right  Skin yeast infection     Discharge Instructions      Your urine was normal with no evidence of infection which is great news.  I am not sure what is causing your widespread pain.  I have ordered some lab work to investigate this and we will contact you if it returns and is abnormal.  Make sure that you are drinking plenty of fluid and use gentle stretch to manage your symptoms.  Continue with your Tylenol as previously prescribed.  I have called in a muscle relaxer that should help with your pain.  This will make you very sleepy and increases your risk of falling so it is important that you use it right before you go to  bed and make sure that you do not fall while on this medication.  You can use the lidocaine patch to help with your back pain.  If you have any worsening or changing symptoms including additional pain, fever, nausea, vomiting you should be seen immediately.  It is very important that we obtain the mammogram soon as possible.  I have ordered an ultrasound and a mammogram in the hopes that this can be moved up and that they have all imaging that is necessary to investigate your pain.  Someone will contact you to schedule an appointment.  If anything changes and you have discharge from your nipple, nipple inversion, increasing pain, fever, skin changes you should be seen immediately.  Please follow-up with your primary care as soon as possible for reevaluation.  If you have any worsening or changing symptoms you need to be seen immediately.     ED Prescriptions     Medication Sig Dispense Auth. Provider   lidocaine (HM LIDOCAINE PATCH) 4 % Place 1 patch onto the skin daily. 14 patch Ennis Delpozo K, PA-C   tiZANidine (ZANAFLEX) 2 MG tablet Take 1 tablet (2 mg total) by mouth at bedtime as needed for muscle spasms.  5 tablet Breean Nannini K, PA-C   ciclopirox (LOPROX) 0.77 % cream Apply topically 2 (two) times daily. 30 g Jerrol Helmers K, PA-C      I have reviewed the PDMP during this encounter.   Terrilee Croak, PA-C 07/12/22 1530    Qusai Kem, Derry Skill, PA-C 07/12/22 1535

## 2022-07-12 NOTE — ED Provider Triage Note (Signed)
Emergency Medicine Provider Triage Evaluation Note  Diane Knight , a 82 y.o. female  was evaluated in triage.  Pt complains of dentalized pain all over her body, she has history of fibromyalgia, she was seen evaluated urgent care today, and encouraged to come to the emergency department due to hemoglobin of 6.6.  Denies previous history of GI bleed, she does not use any NSAIDs, she does not drink any alcohol, she does not take any blood thinners. She does endorse dark tarry stools for weeks. No hematemesis or BRBPR.  Review of Systems  Positive: Generalized pain, weakness, melena Negative: Abdominal pain  Physical Exam  Ht 5\' 6"  (1.676 m)   Wt 90.7 kg   BMI 32.27 kg/m  Gen:   Awake, no distress   Resp:  Normal effort  MSK:   Moves extremities without difficulty  Other:    Medical Decision Making  Medically screening exam initiated at 9:01 PM.  Appropriate orders placed.  Diane Knight was informed that the remainder of the evaluation will be completed by another provider, this initial triage assessment does not replace that evaluation, and the importance of remaining in the ED until their evaluation is complete.  Workup initiated in triage    Diane Knight 07/12/22 2103

## 2022-07-12 NOTE — Telephone Encounter (Signed)
Preliminary CBC showed hemoglobin of 6.6.  Attempted to contact patient to discuss that she would need evaluation in the emergency room but unable to reach her via telephone.  Called both emergency contact numbers and phone number for her son Jodelle Gross) was a wrong number and number for daughter was disconnected.  Unable to leave a voicemail as phone number continue to ring.  Will continue to contact patient and if unable to reach her send wellness check.

## 2022-07-12 NOTE — Telephone Encounter (Signed)
Was able to reach patient to discuss abnormal hemoglobin levels.  She denies any current symptoms.  Discussed that given this level she would need to go to the emergency room for further evaluation and management.  She will attempt to contact a family member to take her to the ER for further evaluation and management.  Discussed that if she is unable to get in touch with anyone or if she develops any concerning symptoms including chest pain, shortness of breath, weakness, lightheadedness she should call 911 to which she expressed understanding.

## 2022-07-12 NOTE — Discharge Instructions (Signed)
Your urine was normal with no evidence of infection which is great news.  I am not sure what is causing your widespread pain.  I have ordered some lab work to investigate this and we will contact you if it returns and is abnormal.  Make sure that you are drinking plenty of fluid and use gentle stretch to manage your symptoms.  Continue with your Tylenol as previously prescribed.  I have called in a muscle relaxer that should help with your pain.  This will make you very sleepy and increases your risk of falling so it is important that you use it right before you go to bed and make sure that you do not fall while on this medication.  You can use the lidocaine patch to help with your back pain.  If you have any worsening or changing symptoms including additional pain, fever, nausea, vomiting you should be seen immediately.  It is very important that we obtain the mammogram soon as possible.  I have ordered an ultrasound and a mammogram in the hopes that this can be moved up and that they have all imaging that is necessary to investigate your pain.  Someone will contact you to schedule an appointment.  If anything changes and you have discharge from your nipple, nipple inversion, increasing pain, fever, skin changes you should be seen immediately.  Please follow-up with your primary care as soon as possible for reevaluation.  If you have any worsening or changing symptoms you need to be seen immediately.

## 2022-07-12 NOTE — ED Triage Notes (Signed)
The pt is c/o generalized body aches for 1-2 weeks  [facility] and was told that she needed to come to the ed

## 2022-07-13 DIAGNOSIS — Z79899 Other long term (current) drug therapy: Secondary | ICD-10-CM | POA: Diagnosis not present

## 2022-07-13 DIAGNOSIS — E1165 Type 2 diabetes mellitus with hyperglycemia: Secondary | ICD-10-CM | POA: Diagnosis present

## 2022-07-13 DIAGNOSIS — Z5320 Procedure and treatment not carried out because of patient's decision for unspecified reasons: Secondary | ICD-10-CM | POA: Diagnosis present

## 2022-07-13 DIAGNOSIS — Z6832 Body mass index (BMI) 32.0-32.9, adult: Secondary | ICD-10-CM | POA: Diagnosis not present

## 2022-07-13 DIAGNOSIS — D649 Anemia, unspecified: Secondary | ICD-10-CM

## 2022-07-13 DIAGNOSIS — E785 Hyperlipidemia, unspecified: Secondary | ICD-10-CM | POA: Diagnosis present

## 2022-07-13 DIAGNOSIS — E876 Hypokalemia: Secondary | ICD-10-CM | POA: Diagnosis not present

## 2022-07-13 DIAGNOSIS — Z888 Allergy status to other drugs, medicaments and biological substances status: Secondary | ICD-10-CM | POA: Diagnosis not present

## 2022-07-13 DIAGNOSIS — M797 Fibromyalgia: Secondary | ICD-10-CM | POA: Diagnosis present

## 2022-07-13 DIAGNOSIS — Z87891 Personal history of nicotine dependence: Secondary | ICD-10-CM | POA: Diagnosis not present

## 2022-07-13 DIAGNOSIS — Z79890 Hormone replacement therapy (postmenopausal): Secondary | ICD-10-CM | POA: Diagnosis not present

## 2022-07-13 DIAGNOSIS — B372 Candidiasis of skin and nail: Secondary | ICD-10-CM | POA: Diagnosis present

## 2022-07-13 DIAGNOSIS — E669 Obesity, unspecified: Secondary | ICD-10-CM | POA: Diagnosis present

## 2022-07-13 DIAGNOSIS — Z833 Family history of diabetes mellitus: Secondary | ICD-10-CM | POA: Diagnosis not present

## 2022-07-13 DIAGNOSIS — Z7982 Long term (current) use of aspirin: Secondary | ICD-10-CM | POA: Diagnosis not present

## 2022-07-13 DIAGNOSIS — I10 Essential (primary) hypertension: Secondary | ICD-10-CM | POA: Diagnosis present

## 2022-07-13 DIAGNOSIS — E1142 Type 2 diabetes mellitus with diabetic polyneuropathy: Secondary | ICD-10-CM | POA: Diagnosis present

## 2022-07-13 DIAGNOSIS — D62 Acute posthemorrhagic anemia: Secondary | ICD-10-CM | POA: Diagnosis present

## 2022-07-13 DIAGNOSIS — E1151 Type 2 diabetes mellitus with diabetic peripheral angiopathy without gangrene: Secondary | ICD-10-CM | POA: Diagnosis present

## 2022-07-13 DIAGNOSIS — E039 Hypothyroidism, unspecified: Secondary | ICD-10-CM | POA: Diagnosis present

## 2022-07-13 DIAGNOSIS — M545 Low back pain, unspecified: Secondary | ICD-10-CM | POA: Diagnosis present

## 2022-07-13 DIAGNOSIS — G8929 Other chronic pain: Secondary | ICD-10-CM | POA: Diagnosis present

## 2022-07-13 DIAGNOSIS — R195 Other fecal abnormalities: Secondary | ICD-10-CM | POA: Diagnosis present

## 2022-07-13 DIAGNOSIS — Z7985 Long-term (current) use of injectable non-insulin antidiabetic drugs: Secondary | ICD-10-CM | POA: Diagnosis not present

## 2022-07-13 LAB — PREPARE RBC (CROSSMATCH)

## 2022-07-13 LAB — POC OCCULT BLOOD, ED: Fecal Occult Bld: NEGATIVE

## 2022-07-13 LAB — HEMOGLOBIN A1C
Hgb A1c MFr Bld: 6.7 % — ABNORMAL HIGH (ref 4.8–5.6)
Mean Plasma Glucose: 145.59 mg/dL

## 2022-07-13 LAB — MAGNESIUM: Magnesium: 2.3 mg/dL (ref 1.7–2.4)

## 2022-07-13 LAB — HEMOGLOBIN AND HEMATOCRIT, BLOOD
HCT: 30.2 % — ABNORMAL LOW (ref 36.0–46.0)
Hemoglobin: 8.7 g/dL — ABNORMAL LOW (ref 12.0–15.0)

## 2022-07-13 LAB — IRON AND TIBC
Iron: 20 ug/dL — ABNORMAL LOW (ref 28–170)
Saturation Ratios: 4 % — ABNORMAL LOW (ref 10.4–31.8)
TIBC: 461 ug/dL — ABNORMAL HIGH (ref 250–450)
UIBC: 441 ug/dL

## 2022-07-13 LAB — GLUCOSE, CAPILLARY
Glucose-Capillary: 153 mg/dL — ABNORMAL HIGH (ref 70–99)
Glucose-Capillary: 179 mg/dL — ABNORMAL HIGH (ref 70–99)
Glucose-Capillary: 189 mg/dL — ABNORMAL HIGH (ref 70–99)
Glucose-Capillary: 207 mg/dL — ABNORMAL HIGH (ref 70–99)

## 2022-07-13 LAB — PHOSPHORUS: Phosphorus: 2.7 mg/dL (ref 2.5–4.6)

## 2022-07-13 LAB — FERRITIN: Ferritin: 3 ng/mL — ABNORMAL LOW (ref 11–307)

## 2022-07-13 MED ORDER — HYDROCHLOROTHIAZIDE 12.5 MG PO TABS
12.5000 mg | ORAL_TABLET | Freq: Every day | ORAL | Status: DC
  Administered 2022-07-13 – 2022-07-15 (×3): 12.5 mg via ORAL
  Filled 2022-07-13 (×3): qty 1

## 2022-07-13 MED ORDER — INSULIN ASPART 100 UNIT/ML IJ SOLN
0.0000 [IU] | Freq: Three times a day (TID) | INTRAMUSCULAR | Status: DC
  Administered 2022-07-13: 3 [IU] via SUBCUTANEOUS
  Administered 2022-07-13 (×2): 2 [IU] via SUBCUTANEOUS
  Administered 2022-07-14: 3 [IU] via SUBCUTANEOUS
  Administered 2022-07-14: 2 [IU] via SUBCUTANEOUS
  Administered 2022-07-14 – 2022-07-15 (×2): 5 [IU] via SUBCUTANEOUS

## 2022-07-13 MED ORDER — ACETAMINOPHEN 325 MG PO TABS
650.0000 mg | ORAL_TABLET | Freq: Four times a day (QID) | ORAL | Status: DC | PRN
  Administered 2022-07-13 – 2022-07-15 (×4): 650 mg via ORAL
  Filled 2022-07-13 (×3): qty 2

## 2022-07-13 MED ORDER — IRBESARTAN 150 MG PO TABS
150.0000 mg | ORAL_TABLET | Freq: Every day | ORAL | Status: DC
  Administered 2022-07-13 – 2022-07-15 (×3): 150 mg via ORAL
  Filled 2022-07-13 (×3): qty 1

## 2022-07-13 MED ORDER — AMLODIPINE BESYLATE 10 MG PO TABS
10.0000 mg | ORAL_TABLET | Freq: Every day | ORAL | Status: DC
  Administered 2022-07-13 – 2022-07-15 (×3): 10 mg via ORAL
  Filled 2022-07-13 (×3): qty 1

## 2022-07-13 MED ORDER — NORTRIPTYLINE HCL 25 MG PO CAPS
25.0000 mg | ORAL_CAPSULE | Freq: Every day | ORAL | Status: DC
  Administered 2022-07-13 – 2022-07-14 (×2): 25 mg via ORAL
  Filled 2022-07-13 (×2): qty 1

## 2022-07-13 MED ORDER — LEVOTHYROXINE SODIUM 100 MCG PO TABS
100.0000 ug | ORAL_TABLET | Freq: Every day | ORAL | Status: DC
  Administered 2022-07-13 – 2022-07-15 (×3): 100 ug via ORAL
  Filled 2022-07-13 (×3): qty 1

## 2022-07-13 MED ORDER — SODIUM CHLORIDE 0.9 % IV SOLN
250.0000 mg | Freq: Once | INTRAVENOUS | Status: AC
  Administered 2022-07-14: 250 mg via INTRAVENOUS
  Filled 2022-07-13: qty 20

## 2022-07-13 MED ORDER — CALCITRIOL 0.25 MCG PO CAPS
0.2500 ug | ORAL_CAPSULE | Freq: Two times a day (BID) | ORAL | Status: DC
  Administered 2022-07-13 – 2022-07-15 (×5): 0.25 ug via ORAL
  Filled 2022-07-13 (×5): qty 1

## 2022-07-13 MED ORDER — ATORVASTATIN CALCIUM 10 MG PO TABS
10.0000 mg | ORAL_TABLET | Freq: Every day | ORAL | Status: DC
  Administered 2022-07-13 – 2022-07-15 (×3): 10 mg via ORAL
  Filled 2022-07-13 (×3): qty 1

## 2022-07-13 MED ORDER — INSULIN ASPART 100 UNIT/ML IJ SOLN
0.0000 [IU] | Freq: Every day | INTRAMUSCULAR | Status: DC
  Administered 2022-07-14: 3 [IU] via SUBCUTANEOUS

## 2022-07-13 MED ORDER — ONDANSETRON HCL 4 MG/2ML IJ SOLN
4.0000 mg | Freq: Four times a day (QID) | INTRAMUSCULAR | Status: DC | PRN
  Administered 2022-07-13: 4 mg via INTRAVENOUS
  Filled 2022-07-13: qty 2

## 2022-07-13 MED ORDER — VALSARTAN-HYDROCHLOROTHIAZIDE 160-12.5 MG PO TABS: 1.0000 | ORAL_TABLET | Freq: Every day | ORAL | Status: DC

## 2022-07-13 MED ORDER — LIDOCAINE 5 % EX PTCH
1.0000 | MEDICATED_PATCH | CUTANEOUS | Status: DC
  Administered 2022-07-13 – 2022-07-15 (×3): 1 via TRANSDERMAL
  Filled 2022-07-13 (×3): qty 1

## 2022-07-13 MED ORDER — SODIUM CHLORIDE 0.9% IV SOLUTION: Freq: Once | INTRAVENOUS | Status: DC

## 2022-07-13 NOTE — ED Provider Notes (Signed)
[facility] EMERGENCY DEPARTMENT Provider Note   CSN: [id number] Arrival date & time: 07/12/22  1826     History  Chief Complaint  Patient presents with   Generalized Body Aches    Diane Knight is a 82 y.o. female.  The history is provided by the patient.  Illness Location:  Body Quality:  All over pain and black tarry stools Severity:  Moderate Onset quality:  Gradual Duration: 2 weeks of pain 1 month of tarry stool. Timing:  Constant Progression:  Unchanged Chronicity:  New Relieved by:  Nothing Worsened by:  Nothing Ineffective treatments:  Tylenol Associated symptoms: myalgias   Associated symptoms: no fever and no wheezing   Risk factors:  Elderly diabetic      Home Medications Prior to Admission medications   Medication Sig Start Date End Date Taking? Authorizing Provider  acetaminophen (TYLENOL) 500 MG tablet Take 500 mg by mouth every 6 (six) hours as needed for mild pain or headache.     [provider]  amLODipine (NORVASC) 10 MG tablet Take 10 mg by mouth daily.    [provider]  amLODipine (NORVASC) 5 MG tablet Take 5 mg by mouth daily. 04/13/22   [provider]  aspirin EC 81 MG tablet Take 81 mg by mouth daily.    [provider]  atorvastatin (LIPITOR) 10 MG tablet Take 10 mg by mouth daily.    [provider]  calcitRIOL (ROCALTROL) 0.25 MCG capsule Take 1 capsule (0.25 mcg total) by mouth 2 (two) times daily. 08/06/17   Darnell Level, MD  calcium carbonate (TUMS) 500 MG chewable tablet Chew 3 tablets (600 mg of elemental calcium total) by mouth 4 (four) times daily -  before meals and at bedtime. 08/06/17   Darnell Level, MD  ciclopirox (LOPROX) 0.77 % cream Apply topically 2 (two) times daily. 07/12/22   Raspet, Erin K, PA-C  HUMALOG MIX 75/25 KWIKPEN (75-25) 100 UNIT/ML KwikPen Inject into the skin. 12/03/21   [provider]  insulin NPH-regular Human (NOVOLIN 70/30) (70-30) 100  UNIT/ML injection Inject 24 Units into the skin 2 (two) times daily with a meal.     [provider]  levothyroxine (SYNTHROID, LEVOTHROID) 100 MCG tablet Take 100 mcg by mouth daily before breakfast.     [provider]  lidocaine (HM LIDOCAINE PATCH) 4 % Place 1 patch onto the skin daily. 07/12/22   Raspet, Noberto Retort, PA-C  meclizine (ANTIVERT) 25 MG tablet Take 1 tablet (25 mg total) by mouth 3 (three) times daily as needed for dizziness. 06/15/16   Rancour, Jeannett Senior, MD  metFORMIN (GLUCOPHAGE-XR) 500 MG 24 hr tablet Take 500 mg by mouth 2 (two) times daily. 04/29/22   [provider]  nortriptyline (PAMELOR) 25 MG capsule TAKE 1 CAPSULE BY MOUTH AT BEDTIME 02/09/14   Levert Feinstein, MD  ondansetron (ZOFRAN) 4 MG tablet Take 1 tablet (4 mg total) by mouth every 6 (six) hours. 06/15/16   Rancour, Jeannett Senior, MD  tiZANidine (ZANAFLEX) 2 MG tablet Take 1 tablet (2 mg total) by mouth at bedtime as needed for muscle spasms. 07/12/22   Raspet, Erin K, PA-C  TRULICITY 1.5 MG/0.5ML SOPN Inject into the skin. 04/13/22   [provider]  valsartan-hydrochlorothiazide (DIOVAN-HCT) 160-12.5 MG per tablet Take 1 tablet by mouth daily.    [provider]      Allergies    Metformin and related    Review of Systems   Review of Systems  Constitutional:  Negative for fever.  HENT:  Negative for facial swelling.   Eyes:  Negative for redness.  Respiratory:  Negative for wheezing.   Gastrointestinal:  Positive for blood in stool.  Musculoskeletal:  Positive for myalgias.  All other systems reviewed and are negative.   Physical Exam Updated Vital Signs BP (!) 142/52   Pulse 68   Temp 98.1 F (36.7 C) (Oral)   Resp (!) 21   Ht 5\' 6"  (1.676 m)   Wt 90.7 kg   SpO2 99%   BMI 32.27 kg/m  Physical Exam Vitals and nursing note reviewed. Exam conducted with a chaperone present.  Constitutional:      General: She is not in acute distress.    Appearance: Normal  appearance. She is well-developed.  HENT:     Head: Normocephalic and atraumatic.     Nose: Nose normal.  Eyes:     Pupils: Pupils are equal, round, and reactive to light.  Cardiovascular:     Rate and Rhythm: Normal rate and regular rhythm.     Pulses: Normal pulses.     Heart sounds: Normal heart sounds.  Pulmonary:     Effort: Pulmonary effort is normal. No respiratory distress.     Breath sounds: Normal breath sounds.  Abdominal:     General: Bowel sounds are normal. There is no distension.     Palpations: Abdomen is soft.     Tenderness: There is no abdominal tenderness. There is no guarding or rebound.  Genitourinary:    Vagina: No vaginal discharge.  Musculoskeletal:        General: Normal range of motion.     Cervical back: Neck supple.  Skin:    General: Skin is warm and dry.     Capillary Refill: Capillary refill takes less than 2 seconds.     Findings: No erythema or rash.  Neurological:     General: No focal deficit present.     Mental Status: She is alert.     Deep Tendon Reflexes: Reflexes normal.  Psychiatric:        Mood and Affect: Mood normal.     ED Results / Procedures / Treatments   Labs (all labs ordered are listed, but only abnormal results are displayed) Results for orders placed or performed during the hospital encounter of 07/12/22  Comprehensive metabolic panel  Result Value Ref Range   Sodium 141 135 - 145 mmol/L   Potassium 3.8 3.5 - 5.1 mmol/L   Chloride 106 98 - 111 mmol/L   CO2 26 22 - 32 mmol/L   Glucose, Bld 271 (H) 70 - 99 mg/dL   BUN 18 8 - 23 mg/dL   Creatinine, Ser 1.11 (H) 0.44 - 1.00 mg/dL   Calcium 8.7 (L) 8.9 - 10.3 mg/dL   Total Protein 7.6 6.5 - 8.1 g/dL   Albumin 3.4 (L) 3.5 - 5.0 g/dL   AST 16 15 - 41 U/L   ALT 10 0 - 44 U/L   Alkaline Phosphatase 87 38 - 126 U/L   Total Bilirubin 0.6 0.3 - 1.2 mg/dL   GFR, Estimated 50 (L) >60 mL/min   Anion gap 9 5 - 15  CBC with Differential  Result Value Ref Range   WBC 8.6  4.0 - 10.5 K/uL   RBC 4.02 3.87 - 5.11 MIL/uL   Hemoglobin 6.4 (LL) 12.0 - 15.0 g/dL   HCT 23.6 (L) 36.0 - 46.0 %   MCV 58.7 (L) 80.0 - 100.0 fL  MCH 15.9 (L) 26.0 - 34.0 pg   MCHC 27.1 (L) 30.0 - 36.0 g/dL   RDW 43.1 (H) 54.0 - 08.6 %   Platelets 425 (H) 150 - 400 K/uL   nRBC 0.0 0.0 - 0.2 %   Neutrophils Relative % 63 %   Neutro Abs 5.5 1.7 - 7.7 K/uL   Lymphocytes Relative 27 %   Lymphs Abs 2.3 0.7 - 4.0 K/uL   Monocytes Relative 7 %   Monocytes Absolute 0.6 0.1 - 1.0 K/uL   Eosinophils Relative 1 %   Eosinophils Absolute 0.1 0.0 - 0.5 K/uL   Basophils Relative 1 %   Basophils Absolute 0.0 0.0 - 0.1 K/uL   Immature Granulocytes 1 %   Abs Immature Granulocytes 0.04 0.00 - 0.07 K/uL  Protime-INR  Result Value Ref Range   Prothrombin Time 13.0 11.4 - 15.2 seconds   INR 1.0 0.8 - 1.2  POC occult blood, ED  Result Value Ref Range   Fecal Occult Bld NEGATIVE NEGATIVE  Type and screen [facility]  Result Value Ref Range   ABO/RH(D) O POS    Antibody Screen NEG    Sample Expiration      07/15/2022,2359 Performed at [facility], [address], [place], [place] [address]    Unit Number [id number]    Blood Component Type RBC LR PHER2    Unit division 00    Status of Unit ALLOCATED    Transfusion Status OK TO TRANSFUSE    Crossmatch Result Compatible    Unit Number [id number]    Blood Component Type RBC LR PHER2    Unit division 00    Status of Unit ALLOCATED    Transfusion Status OK TO TRANSFUSE    Crossmatch Result Compatible   ABO/Rh  Result Value Ref Range   ABO/RH(D)      O POS Performed at [facility], [address] [place], [place] [address]   Prepare RBC (crossmatch)  Result Value Ref Range   Order Confirmation      ORDER PROCESSED BY BLOOD BANK Performed at [facility], [address] [place], [place] [address]   BPAM [facility]  Result Value Ref Range   Blood Product Unit Number [id number]    PRODUCT CODE  [id number]    Unit Type and Rh 5100    Blood Product Expiration Date 229798921194    Blood Product Unit Number [id number]    PRODUCT CODE [id number]    Unit Type and Rh 5100    Blood Product Expiration Date 378588502774    No results found.   Radiology No results found.  Procedures Procedures    Medications Ordered in ED Medications  0.9 %  sodium chloride infusion (Manually program via Guardrails IV Fluids) (has no administration in time range)    ED Course/ Medical Decision Making/ A&P                             Medical Decision Making Patient seen at urgent care for all over body aches and sent to the ED based on lab results   Amount and/or Complexity of Data Reviewed Independent Historian:     Details: Granddaughter see above  External Data Reviewed: notes.    Details: Previous notes reviewed  Labs: ordered.    Details: All labs reviewed: normal sodium 141, normal potassium 3.8, creatinine slight elevation 1.11, normal white count, low hemoglobin 6.4,  elevated platelets 425K  Risk Prescription drug management. Decision regarding hospitalization.  Critical Care Total time providing critical care: 30 minutes (Blood administration, consults and potential for serious outcomes )    Final Clinical Impression(s) / ED Diagnoses Final diagnoses:  Symptomatic anemia   The patient appears reasonably stabilized for admission considering the current resources, flow, and capabilities available in the ED at this time, and I doubt any other [facility] requiring further screening and/or treatment in the ED prior to admission.  Rx / DC Orders ED Discharge Orders     None         Tambi Thole, MD 07/13/22 0867

## 2022-07-13 NOTE — Evaluation (Signed)
Physical Therapy Evaluation Patient Details Name: Diane Knight MRN: [id number] DOB: Nov 15, 1940 Today's Date: 07/13/2022  History of Present Illness  82 yo female [facility] on 1/12 with fatigue, body aches. Pt with ABLA s/p 2 units PRBC. PMH includes peripheral artery disease, type 2 diabetes, hyperlipidemia, hypertension, diabetes polyneuropathy, tobacco dependence.  Clinical Impression   Pt presents with generalized weakness, min difficulty getting OOB, impaired balance, and decreased activity tolerance. Pt to benefit from acute PT to address deficits. Pt ambulated short hallway distance, states she fatigues quickly and has a one bedroom apt so does not have to mobilize for long distances during the day. PTA, pt going to OPPT. PT recommending HHPT to address deficits and improve overall activity tolerance.  PT to progress mobility as tolerated, and will continue to follow acutely.         Recommendations for follow up therapy are one component of a multi-disciplinary discharge planning process, led by the attending physician.  Recommendations may be updated based on patient status, additional functional criteria and insurance authorization.  Follow Up Recommendations Home health PT      Assistance Recommended at Discharge PRN (pt reports she has neighbors that help her as needed)  Patient can return home with the following       Equipment Recommendations None recommended by PT  Recommendations for Other Services       Functional Status Assessment Patient has had a recent decline in their functional status and demonstrates the ability to make significant improvements in function in a reasonable and predictable amount of time.     Precautions / Restrictions Precautions Precautions: Fall Restrictions Weight Bearing Restrictions: No      Mobility  Bed Mobility Overal bed mobility: Needs Assistance Bed Mobility: Supine to Sit     Supine to sit: Min assist, HOB  elevated     General bed mobility comments: light trunk rise assist    Transfers Overall transfer level: Needs assistance Equipment used: Rolling walker (2 wheels) Transfers: Sit to/from Stand Sit to Stand: Supervision           General transfer comment: slow to rise, STS x2 from EOB    Ambulation/Gait Ambulation/Gait assistance: Supervision Gait Distance (Feet): 80 Feet Assistive device: None Gait Pattern/deviations: Step-through pattern, Decreased stride length Gait velocity: decr     General Gait Details: for safety, slowed with limited tolerance. Pt reaching for environment throughout, states this is normal for her  Stairs            Wheelchair Mobility    Modified Rankin (Stroke Patients Only)       Balance Overall balance assessment: Needs assistance Sitting-balance support: No upper extremity supported, Feet supported Sitting balance-Leahy Scale: Fair     Standing balance support: No upper extremity supported, During functional activity Standing balance-Leahy Scale: Fair                               Pertinent Vitals/Pain Pain Assessment Pain Assessment: Faces Faces Pain Scale: Hurts a little bit Pain Location: back Pain Descriptors / Indicators: Sore Pain Intervention(s): Monitored during session, Limited activity within patient's tolerance, Repositioned    Home Living Family/patient expects to be discharged to:: Private residence Living Arrangements: Alone Available Help at Discharge: Family;Friend(s);Available PRN/intermittently Type of Home: Apartment Home Access: Level entry       Home Layout: One level Home Equipment: Cane - single point;Rollator (4 wheels)  Prior Function Prior Level of Function : Independent/Modified Independent                     Hand Dominance   Dominant Hand: Right    Extremity/Trunk Assessment   Upper Extremity Assessment Upper Extremity Assessment: Defer to OT  evaluation    Lower Extremity Assessment Lower Extremity Assessment: Generalized weakness    Cervical / Trunk Assessment Cervical / Trunk Assessment: Normal  Communication   Communication: No difficulties  Cognition Arousal/Alertness: Awake/alert Behavior During Therapy: WFL for tasks assessed/performed Overall Cognitive Status: Within Functional Limits for tasks assessed                                          General Comments      Exercises     Assessment/Plan    PT Assessment Patient needs continued PT services  PT Problem List Decreased strength;Decreased mobility;Decreased activity tolerance;Decreased balance;Decreased knowledge of use of DME;Pain;Decreased safety awareness       PT Treatment Interventions DME instruction;Therapeutic activities;Gait training;Therapeutic exercise;Patient/family education;Balance training;Functional mobility training;Neuromuscular re-education    PT Goals (Current goals can be found in the Care Plan section)  Acute Rehab PT Goals Patient Stated Goal: home, get home therapy and an aide PT Goal Formulation: With patient Time For Goal Achievement: 07/27/22 Potential to Achieve Goals: Good    Frequency Min 3X/week     Co-evaluation               AM-PAC PT "6 Clicks" Mobility  Outcome Measure Help needed turning from your back to your side while in a flat bed without using bedrails?: None Help needed moving from lying on your back to sitting on the side of a flat bed without using bedrails?: A Little Help needed moving to and from a bed to a chair (including a wheelchair)?: A Little Help needed standing up from a chair using your arms (e.g., wheelchair or bedside chair)?: A Little Help needed to walk in hospital room?: A Little Help needed climbing 3-5 steps with a railing? : A Little 6 Click Score: 19    End of Session   Activity Tolerance: Patient tolerated treatment well Patient left: in chair;with  call bell/phone within reach;with chair alarm set Nurse Communication: Mobility status PT Visit Diagnosis: Other abnormalities of gait and mobility (R26.89);Muscle weakness (generalized) (M62.81)    Time: 1031-2811 PT Time Calculation (min) (ACUTE ONLY): 16 min   Charges:   PT Evaluation $PT Eval Low Complexity: 1 Low        Tashae Inda S, PT DPT Acute Rehabilitation Services Pager [phone]  Office [phone]  [place] E Ruffin Pyo 07/13/2022, 3:12 PM

## 2022-07-13 NOTE — ED Notes (Signed)
ED TO INPATIENT HANDOFF REPORT  ED Nurse Name and Phone #: Andi Hence, RN  S Name/Age/Gender Diane Knight 82 y.o. female Room/Bed: 023C/023C  Code Status   Code Status: Full Code  Home/SNF/Other Home Patient oriented to: self, place, time, and situation Is this baseline? Yes   Triage Complete: Triage complete  Chief Complaint Symptomatic anemia [D64.9]  Triage Note The pt is c/o generalized body aches for 1-2 weeks  [facility] and was told that she needed to come to the ed   Allergies Allergies  Allergen Reactions   Metformin And Related Other (See Comments)    GI SIDE EFFECTS    Level of Care/Admitting Diagnosis ED Disposition     ED Disposition  Admit   Condition  --   [place]: [place] [[id number]]  Level of Care: Telemetry Medical [104]  May place patient in observation at [facility] or [place] if equivalent level of care is available:: Yes  Covid Evaluation: Asymptomatic - no recent exposure (last 10 days) testing not required  Diagnosis: Symptomatic anemia [8338250]  Admitting Physician: Kayleen Memos [[id number]]  Attending Physician: Kayleen Memos [[id number]]          B Medical/Surgery History Past Medical History:  Diagnosis Date   Cataract    Diabetes mellitus    TYPE 2    Fibromyalgia    Goiter    Hyperlipidemia    Hypertension    Neuropathy    Tobacco dependence    Past Surgical History:  Procedure Laterality Date   ABDOMINAL HYSTERECTOMY     EYE SURGERY     THYROIDECTOMY N/A 08/04/2017   Procedure: TOTAL THYROIDECTOMY;  Surgeon: Armandina Gemma, MD;  Location: [facility] ORS;  Service: General;  Laterality: N/A;     A IV Location/Drains/Wounds Patient Lines/Drains/Airways Status     Active Line/Drains/Airways     Name Placement date Placement time Site Days   Peripheral IV 07/13/22 20 G Right Forearm 07/13/22  0124  Forearm  less than 1   Peripheral IV 07/13/22 20 G Left Forearm  07/13/22  0125  Forearm  less than 1   Incision (Closed) 08/04/17 Neck 08/04/17  1245  -- 1804            Intake/Output Last 24 hours No intake or output data in the 24 hours ending 07/13/22 0225  Labs/Imaging Results for orders placed or performed during the hospital encounter of 07/12/22 (from the past 48 hour(s))  Type and screen [place]     Status: None (Preliminary result)   Collection Time: 07/12/22  9:00 PM  Result Value Ref Range   ABO/RH(D) O POS    Antibody Screen NEG    Sample Expiration 07/15/2022,2359    Unit Number [id number]    Blood Component Type RBC LR PHER2    Unit division 00    Status of Unit ISSUED    Transfusion Status OK TO TRANSFUSE    Crossmatch Result      Compatible Performed at [facility], [address], [place], [place] [address]    Unit Number [id number]    Blood Component Type RBC LR PHER2    Unit division 00    Status of Unit ALLOCATED    Transfusion Status OK TO TRANSFUSE    Crossmatch Result Compatible   Comprehensive metabolic panel     Status: Abnormal   Collection Time: 07/12/22  9:11 PM  Result  Value Ref Range   Sodium 141 135 - 145 mmol/L   Potassium 3.8 3.5 - 5.1 mmol/L   Chloride 106 98 - 111 mmol/L   CO2 26 22 - 32 mmol/L   Glucose, Bld 271 (H) 70 - 99 mg/dL    Comment: Glucose reference range applies only to samples taken after fasting for at least 8 hours.   BUN 18 8 - 23 mg/dL   Creatinine, Ser 6.29 (H) 0.44 - 1.00 mg/dL   Calcium 8.7 (L) 8.9 - 10.3 mg/dL   Total Protein 7.6 6.5 - 8.1 g/dL   Albumin 3.4 (L) 3.5 - 5.0 g/dL   AST 16 15 - 41 U/L   ALT 10 0 - 44 U/L   Alkaline Phosphatase 87 38 - 126 U/L   Total Bilirubin 0.6 0.3 - 1.2 mg/dL   GFR, Estimated 50 (L) >60 mL/min    Comment: (NOTE) Calculated using the CKD-EPI Creatinine Equation (2021)    Anion gap 9 5 - 15    Comment: Performed at [facility], [address], [place], [place] [address]  CBC with Differential      Status: Abnormal   Collection Time: 07/12/22  9:11 PM  Result Value Ref Range   WBC 8.6 4.0 - 10.5 K/uL   RBC 4.02 3.87 - 5.11 MIL/uL   Hemoglobin 6.4 (LL) 12.0 - 15.0 g/dL    Comment: CRITICAL VALUE NOTED.  VALUE IS CONSISTENT WITH PREVIOUSLY REPORTED AND CALLED VALUE. REPEATED TO VERIFY Reticulocyte Hemoglobin testing may be clinically indicated, consider ordering this additional test [id number]    HCT 23.6 (L) 36.0 - 46.0 %   MCV 58.7 (L) 80.0 - 100.0 fL   MCH 15.9 (L) 26.0 - 34.0 pg   MCHC 27.1 (L) 30.0 - 36.0 g/dL   RDW 72.5 (H) 36.6 - 44.0 %   Platelets 425 (H) 150 - 400 K/uL    Comment: REPEATED TO VERIFY   nRBC 0.0 0.0 - 0.2 %   Neutrophils Relative % 63 %   Neutro Abs 5.5 1.7 - 7.7 K/uL   Lymphocytes Relative 27 %   Lymphs Abs 2.3 0.7 - 4.0 K/uL   Monocytes Relative 7 %   Monocytes Absolute 0.6 0.1 - 1.0 K/uL   Eosinophils Relative 1 %   Eosinophils Absolute 0.1 0.0 - 0.5 K/uL   Basophils Relative 1 %   Basophils Absolute 0.0 0.0 - 0.1 K/uL   Immature Granulocytes 1 %   Abs Immature Granulocytes 0.04 0.00 - 0.07 K/uL    Comment: Performed at [facility], [address] [place], [place] [address]  Protime-INR     Status: None   Collection Time: 07/12/22  9:11 PM  Result Value Ref Range   Prothrombin Time 13.0 11.4 - 15.2 seconds   INR 1.0 0.8 - 1.2    Comment: (NOTE) INR goal varies based on device and disease states. Performed at [facility], [address], [place], [place] [address]   ABO/Rh     Status: None   Collection Time: 07/12/22  9:12 PM  Result Value Ref Range   ABO/RH(D)      O POS Performed at [facility], [address] [place], [place] [address]   POC occult blood, ED     Status: None   Collection Time: 07/13/22 12:48 AM  Result Value Ref Range   Fecal Occult Bld NEGATIVE NEGATIVE  Prepare RBC (crossmatch)     Status: None  Collection Time: 07/13/22 12:51 AM  Result Value Ref Range   Order Confirmation       ORDER PROCESSED BY BLOOD BANK Performed at [facility], [place] [address], [place], [place] [address]    No results found.  Pending Labs Unresulted Labs (From admission, onward)     Start     Ordered   07/13/22 0500  CBC  Tomorrow morning,   R        07/13/22 0132   07/13/22 0500  Magnesium  Tomorrow morning,   R        07/13/22 0132   07/13/22 0500  Phosphorus  Tomorrow morning,   R        07/13/22 0132   07/13/22 5681  Basic metabolic panel  Tomorrow morning,   R        07/13/22 0132            Vitals/Pain Today's Vitals   07/13/22 0120 07/13/22 0121 07/13/22 0121 07/13/22 0215  BP:  (!) 127/45 (!) 127/45 (!) 160/75  Pulse:  68 68 63  Resp:  20 20 13   Temp: 98.1 F (36.7 C)  98.1 F (36.7 C) 97.9 F (36.6 C)  TempSrc: Oral  Oral Oral  SpO2:  98% 98%   Weight:      Height:      PainSc:  0-No pain      Isolation Precautions No active isolations  Medications Medications  0.9 %  sodium chloride infusion (Manually program via Guardrails IV Fluids) (has no administration in time range)    Mobility walks Low fall risk   Focused Assessments Cardiac Assessment Handoff:    Lab Results  Component Value Date   CKTOTAL 113 07/12/2022   TROPONINI <0.03 06/15/2016   No results found for: "DDIMER" Does the Patient currently have chest pain? No   , Neuro Assessment Handoff:  Swallow screen pass? Yes          Neuro Assessment:   Neuro Checks:      Has TPA been given? No If patient is a Neuro Trauma and patient is going to OR before floor call report to [place] nurse: [phone] or [phone]  , Pulmonary Assessment Handoff:  Lung sounds:   O2 Device: Room Air      R Recommendations: See Admitting Provider Note  Report given to:   Additional Notes:

## 2022-07-13 NOTE — Plan of Care (Signed)

## 2022-07-13 NOTE — Progress Notes (Signed)
PROGRESS NOTE    Diane Knight  WFU:[id number] DOB: 1940/08/31 DOA: 07/12/2022 PCP: Seward Carol, MD    Brief Narrative:  Diane Knight is a 82 y.o. female with medical history significant for peripheral artery disease, type 2 diabetes, hyperlipidemia, hypertension, diabetes polyneuropathy, tobacco dependence, who presented to [facility] ED from home due to generalized fatigue, diffuse body aches.   In the ED, acute blood loss anemia with hemoglobin of 6.4 with negative FOBT negative.  Endorses 1 month of black tarry stools and diffuse abdominal pain.  No prior history of EGD or colonoscopy.   2 units PRBC ordered to be transfused by EDP.  No Fe levels ordered prior. Refusing colonoscopy/EGD.  Assessment and Plan: Symptomatic anemia Hemoglobin 6.4 on presentation Endorses dark stools for the past month 2 units PRBCs ordered to be transfused Obtain iron studies (will be post transfusion) -patient does not want colonoscopy/EGD  Hypertension, BP not at goal, elevated Resume home oral antihypertensive   Hyperglycemia Presented with serum glucose 231 Obtain hemoglobin A1c Insulin sliding scale.   Hypothyroidism Resume home levothyroxine   Obesity Estimated body mass index is 32.27 kg/m as calculated from the following:   Height as of this encounter: 5\' 6"  (1.676 m).   Weight as of this encounter: 90.7 kg.    DVT prophylaxis: SCDs Start: 07/13/22 0128    Code Status: Full Code   Disposition Plan:  Level of care: Telemetry Medical Status is: Observation The patient will require care spanning > 2 midnights and should be moved to inpatient because: anemia work up    Consultants:  none   Subjective: No SOB, no CP  Objective: Vitals:   07/13/22 0437 07/13/22 0509 07/13/22 0740 07/13/22 1106  BP:  (!) 142/64 (!) 160/65 (!) 159/68  Pulse:  (!) 59 64   Resp:      Temp: 98.2 F (36.8 C) 97.8 F (36.6 C) 98.4 F (36.9 C)   TempSrc: Oral Oral Oral   SpO2:  97% 98%    Weight:      Height:        Intake/Output Summary (Last 24 hours) at 07/13/2022 1125 Last data filed at 07/13/2022 0730 Gross per 24 hour  Intake 675 ml  Output --  Net 675 ml   Filed Weights   07/12/22 2040  Weight: 90.7 kg    Examination:   General: Appearance:    Obese female in no acute distress     Lungs:     respirations unlabored  Heart:    Normal heart rate. Normal rhythm. No murmurs, rubs, or gallops.    MS:   All extremities are intact.    Neurologic:   Awake, alert       Data Reviewed: I have personally reviewed following labs and imaging studies  CBC: Recent Labs  Lab 07/12/22 1458 07/12/22 2111  WBC 6.5 8.6  NEUTROABS 3.8 5.5  HGB 6.6* 6.4*  HCT 25.3* 23.6*  MCV 59.5* 58.7*  PLT 445* 202*   Basic Metabolic Panel: Recent Labs  Lab 07/12/22 1458 07/12/22 2111  NA 144 141  K 3.4* 3.8  CL 107 106  CO2 27 26  GLUCOSE 125* 271*  BUN 17 18  CREATININE 0.98 1.11*  CALCIUM 8.6* 8.7*   GFR: Estimated Creatinine Clearance: 45.1 mL/min (A) (by C-G formula based on SCr of 1.11 mg/dL (H)). Liver Function Tests: Recent Labs  Lab 07/12/22 1458 07/12/22 2111  AST 17 16  ALT 10 10  ALKPHOS 85 87  BILITOT 0.6 0.6  PROT 7.9 7.6  ALBUMIN 3.6 3.4*   No results for input(s): "LIPASE", "AMYLASE" in the last 168 hours. No results for input(s): "AMMONIA" in the last 168 hours. Coagulation Profile: Recent Labs  Lab 07/12/22 2111  INR 1.0   Cardiac Enzymes: Recent Labs  Lab 07/12/22 1458  CKTOTAL 113   BNP (last 3 results) No results for input(s): "PROBNP" in the last 8760 hours. HbA1C: No results for input(s): "HGBA1C" in the last 72 hours. CBG: Recent Labs  Lab 07/13/22 0915  GLUCAP 207*   Lipid Profile: No results for input(s): "CHOL", "HDL", "LDLCALC", "TRIG", "CHOLHDL", "LDLDIRECT" in the last 72 hours. Thyroid Function Tests: No results for input(s): "TSH", "T4TOTAL", "FREET4", "T3FREE", "THYROIDAB" in the last 72  hours. Anemia Panel: No results for input(s): "VITAMINB12", "FOLATE", "FERRITIN", "TIBC", "IRON", "RETICCTPCT" in the last 72 hours. Sepsis Labs: No results for input(s): "PROCALCITON", "LATICACIDVEN" in the last 168 hours.  No results found for this or any previous visit (from the past 240 hour(s)).       Radiology Studies: No results found.      Scheduled Meds:  sodium chloride   Intravenous Once   amLODipine  10 mg Oral Daily   atorvastatin  10 mg Oral Daily   calcitRIOL  0.25 mcg Oral BID   irbesartan  150 mg Oral Daily   And   hydrochlorothiazide  12.5 mg Oral Daily   insulin aspart  0-5 Units Subcutaneous QHS   insulin aspart  0-9 Units Subcutaneous TID WC   levothyroxine  100 mcg Oral QAC breakfast   lidocaine  1 patch Transdermal Q24H   nortriptyline  25 mg Oral QHS   Continuous Infusions:   LOS: 0 days    Time spent: 45 minutes spent on chart review, discussion with nursing staff, consultants, updating family and interview/physical exam; more than 50% of that time was spent in counseling and/or coordination of care.    Geradine Girt, DO [facility] Available via Epic secure chat 7am-7pm After these hours, please refer to coverage provider listed on [email or web address] 07/13/2022, 11:25 AM

## 2022-07-13 NOTE — H&P (Signed)
History and Physical  Diane Knight GGY:[id number] DOB: [birth year] DOA: 07/12/2022  Referring physician: Dr. Randal Buba, [place]. PCP: Seward Carol, MD  Outpatient Specialists: Vascular surgery. Patient coming from: Home.  Chief Complaint: Generalized fatigue.  HPI: Diane Knight is a 82 y.o. female with medical history significant for peripheral artery disease, type 2 diabetes, hyperlipidemia, hypertension, diabetes polyneuropathy, tobacco dependence, who presented to [facility] ED from home due to generalized fatigue, diffuse body aches.  In the ED, acute blood loss anemia with hemoglobin of 6.4 with negative FOBT negative.  Endorses 1 month of black tarry stools and diffuse abdominal pain.  No prior history of EGD or colonoscopy.  2 units PRBC ordered to be transfused by EDP.  ED Course: Tmax 98.1.  BP 142/64, pulse 59, respiration rate 13, saturation 97% on room air.  Lab studies significant for serum glucose 271 creatinine 1.11 with GFR 50.  Hemoglobin 6.4, MCV 58, WBC 8.6, platelet count 425.  Review of Systems: Review of systems as noted in the HPI. All other systems reviewed and are negative.   Past Medical History:  Diagnosis Date   Cataract    Diabetes mellitus    TYPE 2    Fibromyalgia    Goiter    Hyperlipidemia    Hypertension    Neuropathy    Tobacco dependence    Past Surgical History:  Procedure Laterality Date   ABDOMINAL HYSTERECTOMY     EYE SURGERY     THYROIDECTOMY N/A 08/04/2017   Procedure: TOTAL THYROIDECTOMY;  Surgeon: Armandina Gemma, MD;  Location: [facility] ORS;  Service: General;  Laterality: N/A;    Social History:  reports that she has quit smoking. Her smoking use included cigarettes. She smoked an average of .3 packs per day. She has never used smokeless tobacco. She reports that she does not drink alcohol and does not use drugs.   Allergies  Allergen Reactions   Metformin And Related Other (See Comments)    GI SIDE EFFECTS    Family History  Problem  Relation Age of Onset   Diabetes Mother    High blood pressure Father       Prior to Admission medications   Medication Sig Start Date End Date Taking? Authorizing Provider  acetaminophen (TYLENOL) 500 MG tablet Take 500 mg by mouth every 6 (six) hours as needed for mild pain or headache.     [provider]  amLODipine (NORVASC) 10 MG tablet Take 10 mg by mouth daily.    [provider]  amLODipine (NORVASC) 5 MG tablet Take 5 mg by mouth daily. 04/13/22   [provider]  aspirin EC 81 MG tablet Take 81 mg by mouth daily.    [provider]  atorvastatin (LIPITOR) 10 MG tablet Take 10 mg by mouth daily.    [provider]  calcitRIOL (ROCALTROL) 0.25 MCG capsule Take 1 capsule (0.25 mcg total) by mouth 2 (two) times daily. 08/06/17   Armandina Gemma, MD  calcium carbonate (TUMS) 500 MG chewable tablet Chew 3 tablets (600 mg of elemental calcium total) by mouth 4 (four) times daily -  before meals and at bedtime. 08/06/17   Armandina Gemma, MD  ciclopirox (LOPROX) 0.77 % cream Apply topically 2 (two) times daily. 07/12/22   Raspet, Erin K, PA-C  HUMALOG MIX 75/25 KWIKPEN (75-25) 100 UNIT/ML KwikPen Inject into the skin. 12/03/21   [provider]  insulin NPH-regular Human (NOVOLIN 70/30) (70-30) 100 UNIT/ML injection Inject 24 Units into the skin  2 (two) times daily with a meal.     [provider]  levothyroxine (SYNTHROID, LEVOTHROID) 100 MCG tablet Take 100 mcg by mouth daily before breakfast.     [provider]  lidocaine (HM LIDOCAINE PATCH) 4 % Place 1 patch onto the skin daily. 07/12/22   Raspet, Noberto Retort, PA-C  meclizine (ANTIVERT) 25 MG tablet Take 1 tablet (25 mg total) by mouth 3 (three) times daily as needed for dizziness. 06/15/16   Rancour, Jeannett Senior, MD  metFORMIN (GLUCOPHAGE-XR) 500 MG 24 hr tablet Take 500 mg by mouth 2 (two) times daily. 04/29/22   [provider]  nortriptyline (PAMELOR) 25 MG capsule TAKE 1  CAPSULE BY MOUTH AT BEDTIME 02/09/14   Levert Feinstein, MD  ondansetron (ZOFRAN) 4 MG tablet Take 1 tablet (4 mg total) by mouth every 6 (six) hours. 06/15/16   Rancour, Jeannett Senior, MD  tiZANidine (ZANAFLEX) 2 MG tablet Take 1 tablet (2 mg total) by mouth at bedtime as needed for muscle spasms. 07/12/22   Raspet, Erin K, PA-C  TRULICITY 1.5 MG/0.5ML SOPN Inject into the skin. 04/13/22   [provider]  valsartan-hydrochlorothiazide (DIOVAN-HCT) 160-12.5 MG per tablet Take 1 tablet by mouth daily.    [provider]    Physical Exam: BP (!) 127/45   Pulse 68   Temp 98.1 F (36.7 C) (Oral)   Resp 20   Ht 5\' 6"  (1.676 m)   Wt 90.7 kg   SpO2 98%   BMI 32.27 kg/m   General: 82 y.o. year-old female well developed well nourished in no acute distress.  Alert and oriented x3. Cardiovascular: Regular rate and rhythm with no rubs or gallops.  No thyromegaly or JVD noted.  No lower extremity edema. 2/4 pulses in all 4 extremities. Respiratory: Clear to auscultation with no wheezes or rales. Good inspiratory effort. Abdomen: Soft obese, diffuse tenderness with palpation.  Bowel sounds present.   Muskuloskeletal: No cyanosis, clubbing or edema noted bilaterally Neuro: CN II-XII intact, strength, sensation, reflexes Skin: No ulcerative lesions noted or rashes Psychiatry: Judgement and insight appear normal. Mood is appropriate for condition and setting          Labs on Admission:  Basic Metabolic Panel: Recent Labs  Lab 07/12/22 1458 07/12/22 2111  NA 144 141  K 3.4* 3.8  CL 107 106  CO2 27 26  GLUCOSE 125* 271*  BUN 17 18  CREATININE 0.98 1.11*  CALCIUM 8.6* 8.7*   Liver Function Tests: Recent Labs  Lab 07/12/22 1458 07/12/22 2111  AST 17 16  ALT 10 10  ALKPHOS 85 87  BILITOT 0.6 0.6  PROT 7.9 7.6  ALBUMIN 3.6 3.4*   No results for input(s): "LIPASE", "AMYLASE" in the last 168 hours. No results for input(s): "AMMONIA" in the last 168 hours. CBC: Recent Labs   Lab 07/12/22 1458 07/12/22 2111  WBC 6.5 8.6  NEUTROABS 3.8 5.5  HGB 6.6* 6.4*  HCT 25.3* 23.6*  MCV 59.5* 58.7*  PLT 445* 425*   Cardiac Enzymes: Recent Labs  Lab 07/12/22 1458  CKTOTAL 113    BNP (last 3 results) No results for input(s): "BNP" in the last 8760 hours.  ProBNP (last 3 results) No results for input(s): "PROBNP" in the last 8760 hours.  CBG: No results for input(s): "GLUCAP" in the last 168 hours.  Radiological Exams on Admission: No results found.  EKG: I independently viewed the EKG done and my findings are as followed: None at the time of  this exam.  Assessment/Plan Present on Admission:  Symptomatic anemia  Principal Problem:   Symptomatic anemia  Symptomatic anemia Hemoglobin 6.4 on presentation Negative FOBT Endorses dark stools for the past month 2 units PRBCs ordered to be transfused Obtain CBC posttransfusion Obtain iron studies Consult GI  Hypertension, BP not at goal, elevated Resume home oral antihypertensive Monitor bowel sounds  Hyperglycemia Presented with serum glucose 231 Obtain hemoglobin A1c Insulin sliding scale.  Hypothyroidism Resume home levothyroxine  Obesity BMI 32 Recommend regular physical activity and healthy dieting.   DVT prophylaxis: SCDs  Code Status: Full code  Family Communication: Grandson at bedside  Disposition Plan: Admitted to telemetry medical unit  Consults called: None    Admission status: Observation status.   Status is: Observation    Kayleen Memos MD [facility] Pager [phone]  If 7PM-7AM, please contact night-coverage [email or web address] Password [email or web address]  07/13/2022, 1:28 AM

## 2022-07-14 DIAGNOSIS — D649 Anemia, unspecified: Secondary | ICD-10-CM | POA: Diagnosis not present

## 2022-07-14 LAB — TYPE AND SCREEN
ABO/RH(D): O POS
Antibody Screen: NEGATIVE
Unit division: 0
Unit division: 0

## 2022-07-14 LAB — CBC
HCT: 29.8 % — ABNORMAL LOW (ref 36.0–46.0)
Hemoglobin: 8.5 g/dL — ABNORMAL LOW (ref 12.0–15.0)
MCH: 18.3 pg — ABNORMAL LOW (ref 26.0–34.0)
MCHC: 28.5 g/dL — ABNORMAL LOW (ref 30.0–36.0)
MCV: 64.2 fL — ABNORMAL LOW (ref 80.0–100.0)
Platelets: 375 10*3/uL (ref 150–400)
RBC: 4.64 MIL/uL (ref 3.87–5.11)
RDW: 28.3 % — ABNORMAL HIGH (ref 11.5–15.5)
WBC: 6.7 10*3/uL (ref 4.0–10.5)
nRBC: 0 % (ref 0.0–0.2)

## 2022-07-14 LAB — BASIC METABOLIC PANEL
Anion gap: 9 (ref 5–15)
BUN: 10 mg/dL (ref 8–23)
CO2: 27 mmol/L (ref 22–32)
Calcium: 8.4 mg/dL — ABNORMAL LOW (ref 8.9–10.3)
Chloride: 102 mmol/L (ref 98–111)
Creatinine, Ser: 0.93 mg/dL (ref 0.44–1.00)
GFR, Estimated: >60 mL/min (ref >60)
Glucose, Bld: 158 mg/dL — ABNORMAL HIGH (ref 70–99)
Potassium: 2.9 mmol/L — ABNORMAL LOW (ref 3.5–5.1)
Sodium: 138 mmol/L (ref 135–145)

## 2022-07-14 LAB — GLUCOSE, CAPILLARY
Glucose-Capillary: 195 mg/dL — ABNORMAL HIGH (ref 70–99)
Glucose-Capillary: 229 mg/dL — ABNORMAL HIGH (ref 70–99)
Glucose-Capillary: 258 mg/dL — ABNORMAL HIGH (ref 70–99)
Glucose-Capillary: 296 mg/dL — ABNORMAL HIGH (ref 70–99)

## 2022-07-14 LAB — BPAM RBC
Blood Product Expiration Date: 202402102359
Blood Product Expiration Date: 202402102359
ISSUE DATE / TIME: 202401130133
ISSUE DATE / TIME: 202401130426
Unit Type and Rh: 5100
Unit Type and Rh: 5100

## 2022-07-14 MED ORDER — POTASSIUM CHLORIDE CRYS ER 20 MEQ PO TBCR
40.0000 meq | EXTENDED_RELEASE_TABLET | ORAL | Status: AC
  Administered 2022-07-14 (×3): 40 meq via ORAL
  Filled 2022-07-14 (×3): qty 2

## 2022-07-14 NOTE — Progress Notes (Signed)
Mobility Specialist Progress Note    07/14/22 1223  Mobility  Activity Ambulated with assistance in hallway  Level of Assistance Contact guard assist, steadying assist  Assistive Device Other (Comment) (HHA)  Distance Ambulated (ft) 200 ft  Activity Response Tolerated well  Mobility Referral Yes  $Mobility charge 1 Mobility   Post-Mobility: 76 HR  Pt received in bed and agreeable. No complaints on walk. Took a few short standing rest breaks. Returned to chair with call bell in reach.    Hildred Alamin Mobility Specialist  Please Psychologist, sport and exercise or Rehab Office at [phone]

## 2022-07-14 NOTE — TOC Initial Note (Addendum)
Transition of Care [facility]) - Initial/Assessment Note    Patient Details  Name: Diane Knight MRN: [id number] Date of Birth: 1940/09/24  [facility] CM/SW Contact:    Bartholomew Crews, RN Phone Number: [phone] 07/14/2022, 12:43 PM  Clinical Narrative:       UPDATE 1250: Referral for [facility] PT accepted by [facility]. Patient will [facility] Face to Face order.              Spoke with patient at the bedside this morning to discuss post acute transition. PTA patient home alone and independent. Uses [facility] for transportation. Has been attending outpatient PT at [facility], but would like to participate in [facility] PT while recovering from this hospitalization. Referral pending with [facility]. Patient will need HH PT Face to Face order. Family to provide transportation home at discharge. TOC following for transition needs.   Expected Discharge Plan: [place] Barriers to Discharge: Continued Medical Work up   Patient Goals and CMS Choice Patient states their goals for this hospitalization and ongoing recovery are:: return home CMS Medicare.gov Compare Post Acute Care list provided to:: Patient Choice offered to / list presented to : Patient      Expected Discharge Plan and Services In-house Referral: NA Discharge Planning Services: CM Consult Post Acute Care Choice: [place] arrangements for the past 2 months: Apartment                 DME Arranged: N/A DME Agency: NA       HH Arranged: PT [place] Agency: [place] ([place]) Date [place]: 07/14/22 Time [place]: 1243 Representative spoke with at [place]: Corene Cornea - referral pending  Prior Living Arrangements/Services Living arrangements for the past 2 months: Apartment Lives with:: Self Patient language and need for interpreter reviewed:: Yes Do you feel safe going back to the place where you live?: Yes      Need for Family Participation in Patient Care: Yes  (Comment) Care giver support system in place?: Yes (comment)   Criminal Activity/Legal Involvement Pertinent to Current Situation/Hospitalization: No - Comment as needed  Activities of Daily Living Home Assistive Devices/Equipment: Eyeglasses ADL Screening (condition at time of admission) Patient's cognitive ability adequate to safely complete daily activities?: Yes Is the patient deaf or have difficulty hearing?: No Does the patient have difficulty seeing, even when wearing glasses/contacts?: Yes Does the patient have difficulty concentrating, remembering, or making decisions?: No Patient able to express need for assistance with ADLs?: Yes Does the patient have difficulty dressing or bathing?: No Independently performs ADLs?: Yes (appropriate for developmental age) Does the patient have difficulty walking or climbing stairs?: Yes Weakness of Legs: None Weakness of Arms/Hands: None  Permission Sought/Granted                  Emotional Assessment Appearance:: Appears stated age Attitude/Demeanor/Rapport: Engaged Affect (typically observed): Accepting Orientation: : Oriented to Self, Oriented to Place, Oriented to  Time, Oriented to Situation Alcohol / Substance Use: Not Applicable Psych Involvement: No (comment)  Admission diagnosis:  Symptomatic anemia [D64.9] Anemia [D64.9] Patient Active Problem List   Diagnosis Date Noted   Symptomatic anemia 07/13/2022   Anemia 07/13/2022   Thyroid goiter 08/03/2017   DM (diabetes mellitus) ([place]) 12/03/2012   Peripheral neuropathy 12/03/2012   Fibromyalgia    PCP:  Seward Carol, MD Pharmacy:   [address], [place]  [place] [address] Phone: [phone] Fax: [phone]  [place], [address] [place] [place] [address] Phone: [phone] Fax: [phone]     Social Determinants of Health  (SDOH) Social History: SDOH Screenings   Tobacco Use: Medium Risk (07/12/2022)   SDOH Interventions:     Readmission Risk Interventions     No data to display

## 2022-07-14 NOTE — Plan of Care (Signed)
  Problem: Education: Goal: Knowledge of General Education information will improve Description: Including pain rating scale, medication(s)/side effects and non-pharmacologic comfort measures Outcome: Not Progressing   Problem: Health Behavior/Discharge Planning: Goal: Ability to manage health-related needs will improve Outcome: Not Progressing   Problem: Clinical Measurements: Goal: Ability to maintain clinical measurements within normal limits will improve Outcome: Not Progressing Goal: Will remain free from infection Outcome: Not Progressing Goal: Diagnostic test results will improve Outcome: Not Progressing Goal: Respiratory complications will improve Outcome: Not Progressing Goal: Cardiovascular complication will be avoided Outcome: Not Progressing   Problem: Activity: Goal: Risk for activity intolerance will decrease Outcome: Not Progressing   Problem: Nutrition: Goal: Adequate nutrition will be maintained Outcome: Not Progressing   Problem: Coping: Goal: Level of anxiety will decrease Outcome: Not Progressing   Problem: Elimination: Goal: Will not experience complications related to bowel motility Outcome: Not Progressing Goal: Will not experience complications related to urinary retention Outcome: Not Progressing   Problem: Safety: Goal: Ability to remain free from injury will improve Outcome: Not Progressing   Problem: Skin Integrity: Goal: Risk for impaired skin integrity will decrease Outcome: Not Progressing   Problem: Education: Goal: Ability to describe self-care measures that may prevent or decrease complications (Diabetes Survival Skills Education) will improve Outcome: Not Progressing Goal: Individualized Educational Video(s) Outcome: Not Progressing   Problem: Coping: Goal: Ability to adjust to condition or change in health will improve Outcome: Not Progressing   Problem: Fluid Volume: Goal: Ability to maintain a balanced intake and output  will improve Outcome: Not Progressing

## 2022-07-14 NOTE — Progress Notes (Signed)
PROGRESS NOTE    Diane Knight  WUJ:[id number] DOB: 02/05/1941 DOA: 07/12/2022 PCP: Seward Carol, MD    Brief Narrative:  Diane Knight is a 82 y.o. female with medical history significant for peripheral artery disease, type 2 diabetes, hyperlipidemia, hypertension, diabetes polyneuropathy, tobacco dependence, who presented to [facility] ED from home due to generalized fatigue, diffuse body aches.   In the ED, acute blood loss anemia with hemoglobin of 6.4 with negative FOBT negative.  Endorses 1 month of black tarry stools and diffuse abdominal pain.  No prior history of EGD or colonoscopy.   2 units PRBC ordered to be transfused by EDP.  No Fe levels ordered prior. Refusing colonoscopy/EGD-- despite further conversation she still does not want any procedures/  .  Assessment and Plan: Symptomatic anemia Hemoglobin 6.4 on presentation Endorses dark stools for the past month 2 units PRBCs -- with increased Hgb Fe low post transfusion so will give IV Fe -patient does not want colonoscopy/EGD  Hypertension, BP not at goal, elevated Resume home oral antihypertensive   Hyperglycemia Presented with serum glucose 231 Insulin sliding scale.   Hypokalemia -replete  Hypothyroidism Resume home levothyroxine   Obesity Estimated body mass index is 32.27 kg/m as calculated from the following:   Height as of this encounter: 5\' 6"  (1.676 m).   Weight as of this encounter: 90.7 kg.    DVT prophylaxis: SCDs Start: 07/13/22 0128    Code Status: Full Code   Disposition Plan:  Level of care: Telemetry Medical Status is: inpt    Consultants:  none   Subjective: Feeling better  Objective: Vitals:   07/13/22 0509 07/13/22 0740 07/13/22 1106 07/14/22 0900  BP: (!) 142/64 (!) 160/65 (!) 159/68 (!) 146/70  Pulse: (!) 59 64  69  Resp:    20  Temp: 97.8 F (36.6 C) 98.4 F (36.9 C)  98.6 F (37 C)  TempSrc: Oral Oral  Oral  SpO2: 97% 98%  100%  Weight:      Height:         Intake/Output Summary (Last 24 hours) at 07/14/2022 0959 Last data filed at 07/14/2022 0900 Gross per 24 hour  Intake 920 ml  Output --  Net 920 ml   Filed Weights   07/12/22 2040  Weight: 90.7 kg    Examination:   General: Appearance:    Obese female in no acute distress     Lungs:      respirations unlabored  Heart:    Normal heart rate. Normal rhythm. No murmurs, rubs, or gallops.   MS:   All extremities are intact.   Neurologic:   Awake, alert       Data Reviewed: I have personally reviewed following labs and imaging studies  CBC: Recent Labs  Lab 07/12/22 1458 07/12/22 2111 07/13/22 1231 07/14/22 0304  WBC 6.5 8.6  --  6.7  NEUTROABS 3.8 5.5  --   --   HGB 6.6* 6.4* 8.7* 8.5*  HCT 25.3* 23.6* 30.2* 29.8*  MCV 59.5* 58.7*  --  64.2*  PLT 445* 425*  --  956   Basic Metabolic Panel: Recent Labs  Lab 07/12/22 1458 07/12/22 2111 07/13/22 1231 07/14/22 0304  NA 144 141  --  138  K 3.4* 3.8  --  2.9*  CL 107 106  --  102  CO2 27 26  --  27  GLUCOSE 125* 271*  --  158*  BUN 17 18  --  10  CREATININE 0.98  1.11*  --  0.93  CALCIUM 8.6* 8.7*  --  8.4*  MG  --   --  2.3  --   PHOS  --   --  2.7  --    GFR: Estimated Creatinine Clearance: 53.8 mL/min (by C-G formula based on SCr of 0.93 mg/dL). Liver Function Tests: Recent Labs  Lab 07/12/22 1458 07/12/22 2111  AST 17 16  ALT 10 10  ALKPHOS 85 87  BILITOT 0.6 0.6  PROT 7.9 7.6  ALBUMIN 3.6 3.4*   No results for input(s): "LIPASE", "AMYLASE" in the last 168 hours. No results for input(s): "AMMONIA" in the last 168 hours. Coagulation Profile: Recent Labs  Lab 07/12/22 2111  INR 1.0   Cardiac Enzymes: Recent Labs  Lab 07/12/22 1458  CKTOTAL 113   BNP (last 3 results) No results for input(s): "PROBNP" in the last 8760 hours. HbA1C: Recent Labs    07/13/22 1231  HGBA1C 6.7*   CBG: Recent Labs  Lab 07/13/22 0915 07/13/22 1141 07/13/22 1747 07/13/22 2343 07/14/22 0827  GLUCAP  207* 189* 153* 179* 195*   Lipid Profile: No results for input(s): "CHOL", "HDL", "LDLCALC", "TRIG", "CHOLHDL", "LDLDIRECT" in the last 72 hours. Thyroid Function Tests: No results for input(s): "TSH", "T4TOTAL", "FREET4", "T3FREE", "THYROIDAB" in the last 72 hours. Anemia Panel: Recent Labs    07/13/22 1231  FERRITIN 3*  TIBC 461*  IRON 20*   Sepsis Labs: No results for input(s): "PROCALCITON", "LATICACIDVEN" in the last 168 hours.  No results found for this or any previous visit (from the past 240 hour(s)).       Radiology Studies: No results found.      Scheduled Meds:  sodium chloride   Intravenous Once   amLODipine  10 mg Oral Daily   atorvastatin  10 mg Oral Daily   calcitRIOL  0.25 mcg Oral BID   irbesartan  150 mg Oral Daily   And   hydrochlorothiazide  12.5 mg Oral Daily   insulin aspart  0-5 Units Subcutaneous QHS   insulin aspart  0-9 Units Subcutaneous TID WC   levothyroxine  100 mcg Oral QAC breakfast   lidocaine  1 patch Transdermal Q24H   nortriptyline  25 mg Oral QHS   potassium chloride  40 mEq Oral Q4H   Continuous Infusions:  ferric gluconate (FERRLECIT) IVPB       LOS: 1 day    Time spent: 45 minutes spent on chart review, discussion with nursing staff, consultants, updating family and interview/physical exam; more than 50% of that time was spent in counseling and/or coordination of care.    Geradine Girt, DO [facility] Available via Epic secure chat 7am-7pm After these hours, please refer to coverage provider listed on [email or web address] 07/14/2022, 9:59 AM

## 2022-07-15 DIAGNOSIS — D649 Anemia, unspecified: Secondary | ICD-10-CM | POA: Diagnosis not present

## 2022-07-15 LAB — CBC
HCT: 30.7 % — ABNORMAL LOW (ref 36.0–46.0)
Hemoglobin: 8.7 g/dL — ABNORMAL LOW (ref 12.0–15.0)
MCH: 18.3 pg — ABNORMAL LOW (ref 26.0–34.0)
MCHC: 28.3 g/dL — ABNORMAL LOW (ref 30.0–36.0)
MCV: 64.6 fL — ABNORMAL LOW (ref 80.0–100.0)
Platelets: 373 10*3/uL (ref 150–400)
RBC: 4.75 MIL/uL (ref 3.87–5.11)
RDW: 28.7 % — ABNORMAL HIGH (ref 11.5–15.5)
WBC: 6.4 10*3/uL (ref 4.0–10.5)
nRBC: 0 % (ref 0.0–0.2)

## 2022-07-15 LAB — BASIC METABOLIC PANEL
Anion gap: 6 (ref 5–15)
BUN: 9 mg/dL (ref 8–23)
CO2: 27 mmol/L (ref 22–32)
Calcium: 8.5 mg/dL — ABNORMAL LOW (ref 8.9–10.3)
Chloride: 104 mmol/L (ref 98–111)
Creatinine, Ser: 0.98 mg/dL (ref 0.44–1.00)
GFR, Estimated: 58 mL/min — ABNORMAL LOW (ref >60)
Glucose, Bld: 233 mg/dL — ABNORMAL HIGH (ref 70–99)
Potassium: 4.4 mmol/L (ref 3.5–5.1)
Sodium: 137 mmol/L (ref 135–145)

## 2022-07-15 LAB — GLUCOSE, CAPILLARY: Glucose-Capillary: 262 mg/dL — ABNORMAL HIGH (ref 70–99)

## 2022-07-15 MED ORDER — SODIUM CHLORIDE 0.9 % IV SOLN
250.0000 mg | Freq: Once | INTRAVENOUS | Status: AC
  Administered 2022-07-15: 250 mg via INTRAVENOUS
  Filled 2022-07-15: qty 20

## 2022-07-15 MED ORDER — IRON 325 (65 FE) MG PO TABS: 325.0000 mg | ORAL_TABLET | Freq: Every day | ORAL | 0 refills | Status: DC

## 2022-07-15 MED ORDER — POLYETHYLENE GLYCOL 3350 17 G PO PACK
17.0000 g | PACK | Freq: Every day | ORAL | Status: DC
  Administered 2022-07-15: 17 g via ORAL

## 2022-07-15 NOTE — Progress Notes (Signed)
Discharge instructions were given to the patient. All questions were answered.

## 2022-07-15 NOTE — Discharge Summary (Signed)
Physician Discharge Summary  Diane Knight ZDG:[id number] DOB: 07/12/1940 DOA: 07/12/2022  PCP: Seward Carol, MD  Admit date: 07/12/2022 Discharge date: 07/15/2022  Admitted From: home Discharge disposition: home   Recommendations for Outpatient Follow-Up:   Discuss need for ASA with PCP Home health Follow CBC and Fe levels Refer for to GI if she agrees for colonoscopy/EGD   Discharge Diagnosis:   Principal Problem:   Symptomatic anemia Active Problems:   Anemia    Discharge Condition: Improved.  Diet recommendation: Carbohydrate-modified.   Wound care: None.  Code status: Full.   History of Present Illness:   Diane Knight is a 82 y.o. female with medical history significant for peripheral artery disease, type 2 diabetes, hyperlipidemia, hypertension, diabetes polyneuropathy, tobacco dependence, who presented to [facility] ED from home due to generalized fatigue, diffuse body aches.   In the ED, acute blood loss anemia with hemoglobin of 6.4 with negative FOBT negative.  Endorses 1 month of black tarry stools and diffuse abdominal pain.  No prior history of EGD or colonoscopy.   2 units PRBC ordered to be transfused by EDP.   Hospital Course by Problem:   Symptomatic anemia Hemoglobin 6.4 on presentation Endorses dark stools for the past month 2 units PRBCs -- with improvement in Hgb Fe low post transfusion so will give IV Fe x 2 -patient does not want colonoscopy/EGD   Hypertension, BP not at goal, elevated Resume home oral antihypertensive   Hyperglycemia -resume home health   Hypokalemia -replete   Hypothyroidism Resume home levothyroxine   Obesity Estimated body mass index is 32.27 kg/m as calculated from the following:   Height as of this encounter: 5\' 6"  (1.676 m).   Weight as of this encounter: 90.7 kg.     Medical Consultants:      Discharge Exam:   Vitals:   07/14/22 1703 07/14/22 2012  BP: (!) 129/48 (!) 151/78   Pulse: 63 63  Resp: 18 16  Temp: 98 F (36.7 C) 98.3 F (36.8 C)  SpO2: 100% 100%   Vitals:   07/13/22 1106 07/14/22 0900 07/14/22 1703 07/14/22 2012  BP: (!) 159/68 (!) 146/70 (!) 129/48 (!) 151/78  Pulse:  69 63 63  Resp:  20 18 16   Temp:  98.6 F (37 C) 98 F (36.7 C) 98.3 F (36.8 C)  TempSrc:  Oral  Oral  SpO2:  100% 100% 100%  Weight:      Height:        General exam: Appears calm and comfortable.   The results of significant diagnostics from this hospitalization (including imaging, microbiology, ancillary and laboratory) are listed below for reference.     Procedures and Diagnostic Studies:   No results found.   Labs:   Basic Metabolic Panel: Recent Labs  Lab 07/12/22 1458 07/12/22 2111 07/13/22 1231 07/14/22 0304 07/15/22 0323  NA 144 141  --  138 137  K 3.4* 3.8  --  2.9* 4.4  CL 107 106  --  102 104  CO2 27 26  --  27 27  GLUCOSE 125* 271*  --  158* 233*  BUN 17 18  --  10 9  CREATININE 0.98 1.11*  --  0.93 0.98  CALCIUM 8.6* 8.7*  --  8.4* 8.5*  MG  --   --  2.3  --   --   PHOS  --   --  2.7  --   --    GFR Estimated  Creatinine Clearance: 51.1 mL/min (by C-G formula based on SCr of 0.98 mg/dL). Liver Function Tests: Recent Labs  Lab 07/12/22 1458 07/12/22 2111  AST 17 16  ALT 10 10  ALKPHOS 85 87  BILITOT 0.6 0.6  PROT 7.9 7.6  ALBUMIN 3.6 3.4*   No results for input(s): "LIPASE", "AMYLASE" in the last 168 hours. No results for input(s): "AMMONIA" in the last 168 hours. Coagulation profile Recent Labs  Lab 07/12/22 2111  INR 1.0    CBC: Recent Labs  Lab 07/12/22 1458 07/12/22 2111 07/13/22 1231 07/14/22 0304 07/15/22 0323  WBC 6.5 8.6  --  6.7 6.4  NEUTROABS 3.8 5.5  --   --   --   HGB 6.6* 6.4* 8.7* 8.5* 8.7*  HCT 25.3* 23.6* 30.2* 29.8* 30.7*  MCV 59.5* 58.7*  --  64.2* 64.6*  PLT 445* 425*  --  375 373   Cardiac Enzymes: Recent Labs  Lab 07/12/22 1458  CKTOTAL 113   BNP: Invalid input(s):  "POCBNP" CBG: Recent Labs  Lab 07/14/22 0827 07/14/22 1141 07/14/22 1659 07/14/22 2011 07/15/22 0809  GLUCAP 195* 229* 258* 296* 262*   D-Dimer No results for input(s): "DDIMER" in the last 72 hours. Hgb A1c Recent Labs    07/13/22 1231  HGBA1C 6.7*   Lipid Profile No results for input(s): "CHOL", "HDL", "LDLCALC", "TRIG", "CHOLHDL", "LDLDIRECT" in the last 72 hours. Thyroid function studies No results for input(s): "TSH", "T4TOTAL", "T3FREE", "THYROIDAB" in the last 72 hours.  Invalid input(s): "FREET3" Anemia work up Recent Labs    07/13/22 1231  FERRITIN 3*  TIBC 461*  IRON 20*   Microbiology No results found for this or any previous visit (from the past 240 hour(s)).   Discharge Instructions:   Discharge Instructions     Diet Carb Modified   Complete by: As directed    Discharge instructions   Complete by: As directed    You need EGD/colonoscopy Your PCP will need to follow your Fe levels and Hgb closely-- will need periodic transfusion of Fe.   Increase activity slowly   Complete by: As directed       Allergies as of 07/15/2022       Reactions   Metformin And Related Other (See Comments)   GI SIDE EFFECTS        Medication List     STOP taking these medications    aspirin EC 81 MG tablet   ciclopirox 0.77 % cream Commonly known as: Loprox   LYRICA PO   tiZANidine 2 MG tablet Commonly known as: ZANAFLEX       TAKE these medications    acetaminophen 500 MG tablet Commonly known as: TYLENOL Take 500 mg by mouth every 6 (six) hours as needed for mild pain or headache.   amLODipine 5 MG tablet Commonly known as: NORVASC Take 5 mg by mouth daily.   atorvastatin 10 MG tablet Commonly known as: LIPITOR Take 10 mg by mouth daily.   calcium carbonate 500 MG chewable tablet Commonly known as: Tums Chew 3 tablets (600 mg of elemental calcium total) by mouth 4 (four) times daily -  before meals and at bedtime.   HumaLOG Mix 75/25  KwikPen (75-25) 100 UNIT/ML Kwikpen Generic drug: Insulin Lispro Prot & Lispro Inject 50-55 Units into the skin. 55am 50pm   Iron 325 (65 Fe) MG Tabs Take 1 tablet (325 mg total) by mouth daily. Start taking on: July 16, 2022   levothyroxine 125 MCG tablet Commonly  known as: SYNTHROID Take 125 mcg by mouth daily before breakfast.   lidocaine 4 % Commonly known as: HM Lidocaine Patch Place 1 patch onto the skin daily.   metFORMIN 500 MG 24 hr tablet Commonly known as: GLUCOPHAGE-XR Take 100 mg by mouth every evening.   Trulicity 1.5 GY/5.6LS Sopn Generic drug: Dulaglutide Inject 1.5 mg into the skin once a week.   valsartan-hydrochlorothiazide 160-12.5 MG tablet Commonly known as: DIOVAN-HCT Take 1 tablet by mouth daily.        Follow-up Information     [facility] Follow up.   Why: Someone from the office will call to schedule home health visits Contact information: [address] [place], [place], [place] [address] Phone: [phone]                 Time coordinating discharge: 45 min  Signed:  Geradine Girt DO  [facility] 07/15/2022, 8:31 AM

## 2022-07-15 NOTE — TOC Transition Note (Signed)
Transition of Care [facility]) - CM/SW Discharge Note   Patient Details  Name: Diane Knight MRN: [id number] Date of Birth: [birth year]  [facility] of [facility]) CM/SW Contact:  Bartholomew Crews, RN Phone Number: [phone] 07/15/2022, 9:45 AM   Clinical Narrative:     Patient to transition home later today. Spoke with patient at the bedside. Her son will provide transportation home. [facility] will f/u outpatient for [facility] PT. No further TOC needs identified at this time.   Final next level of care: Home w Home Health Services Barriers to Discharge: No Barriers Identified   Patient Goals and CMS Choice CMS Medicare.gov Compare Post Acute Care list provided to:: Patient Choice offered to / list presented to : Patient  Discharge Placement                         Discharge Plan and Services Additional resources added to the After Visit Summary for   In-house Referral: NA Discharge Planning Services: CM Consult Post Acute Care Choice: Home Health          DME Arranged: N/A DME Agency: NA       HH Arranged: PT [place] Agency: [place] ([place]) Date HH Agency Contacted: 07/15/22 Time [place]: 0945 Representative spoke with at [place]: [place] Determinants of Health ([place]) Interventions SDOH Screenings   Food Insecurity: No Food Insecurity (07/15/2022)  Housing: Low Risk  (07/15/2022)  Transportation Needs: No Transportation Needs (07/15/2022)  Utilities: Not At Risk (07/15/2022)  Tobacco Use: Medium Risk (07/12/2022)     Readmission Risk Interventions     No data to display

## 2022-07-15 NOTE — Progress Notes (Signed)
Physical Therapy Treatment Patient Details Name: Diane Knight MRN: [id number] DOB: Feb 27, 1941 Today's Date: 07/15/2022   History of Present Illness 82 yo female presents to [facility] on 1/12 with fatigue, body aches. Pt with ABLA s/p 2 units PRBC. PMH includes peripheral artery disease, type 2 diabetes, hyperlipidemia, hypertension, diabetes polyneuropathy, tobacco dependence.    PT Comments    Pt received in recliner. She reports her son will come get her today if she is discharged, however, he will not stay the night with her as he works night. Pt mobilizing at supervision level. Used rollator for ambulation which is what she uses in community. Pt with trunk flexion with ambulation, worked on chest and shoulder stretching afterwards as pt with L RTC issues. Pt to f/u with outpt PT when appropriate for L shoulder. Recommend HHPT before that.    Recommendations for follow up therapy are one component of a multi-disciplinary discharge planning process, led by the attending physician.  Recommendations may be updated based on patient status, additional functional criteria and insurance authorization.  Follow Up Recommendations  Home health PT     Assistance Recommended at Discharge PRN (pt reports she has neighbors that help her as needed)  Patient can return home with the following Assistance with cooking/housework   Equipment Recommendations  None recommended by PT    Recommendations for Other Services       Precautions / Restrictions Precautions Precautions: Fall Restrictions Weight Bearing Restrictions: No     Mobility  Bed Mobility               General bed mobility comments: received in recliner    Transfers Overall transfer level: Needs assistance Equipment used: Rolling walker (2 wheels) Transfers: Sit to/from Stand Sit to Stand: Supervision           General transfer comment: safe with standing to rollator. Also practiced sit>stand from rollator, needed  reminding to lock brakes    Ambulation/Gait Ambulation/Gait assistance: Supervision Gait Distance (Feet): 200 Feet Assistive device: Rollator (4 wheels) Gait Pattern/deviations: Step-through pattern, Decreased stride length Gait velocity: decr Gait velocity interpretation: <1.8 ft/sec, indicate of risk for recurrent falls   General Gait Details: used rollator which is what she uses for longer distances in the community (unless she's at grocery in which case she uses cart). vc's for posture with use of rollator. One seated rest break taken. Bari rollator used which she reported liking more than her standard rollator at home   Marine scientist [place] (Stroke Patients Only)       Balance Overall balance assessment: Needs assistance Sitting-balance support: No upper extremity supported, Feet supported Sitting balance-Leahy Scale: Good     Standing balance support: No upper extremity supported, During functional activity Standing balance-Leahy Scale: Fair Standing balance comment: able to maintain static stance without UE support but needs UE support for stability with mvmt and ambulation                            Cognition Arousal/Alertness: Awake/alert Behavior During Therapy: WFL for tasks assessed/performed Overall Cognitive Status: Within Functional Limits for tasks assessed                                          Exercises  Other Exercises Other Exercises: shoulder flex B (pt with L RTC injury) Other Exercises: chest stretch in standing Other Exercises: AP's x10    General Comments General comments (skin integrity, edema, etc.): VSS      Pertinent Vitals/Pain Pain Assessment Pain Assessment: Faces Faces Pain Scale: Hurts a little bit Pain Location: back Pain Descriptors / Indicators: Sore Pain Intervention(s): Monitored during session, Repositioned    Home Living                           Prior Function            PT Goals (current goals can now be found in the care plan section) Acute Rehab PT Goals Patient Stated Goal: home, get home therapy and an aide PT Goal Formulation: With patient Time For Goal Achievement: 07/27/22 Potential to Achieve Goals: Good Progress towards PT goals: Progressing toward goals    Frequency    Min 3X/week      PT Plan Current plan remains appropriate    Co-evaluation              AM-PAC PT "6 Clicks" Mobility   Outcome Measure  Help needed turning from your back to your side while in a flat bed without using bedrails?: None Help needed moving from lying on your back to sitting on the side of a flat bed without using bedrails?: A Little Help needed moving to and from a bed to a chair (including a wheelchair)?: A Little Help needed standing up from a chair using your arms (e.g., wheelchair or bedside chair)?: A Little Help needed to walk in hospital room?: A Little Help needed climbing 3-5 steps with a railing? : A Little 6 Click Score: 19    End of Session Equipment Utilized During Treatment: Gait belt Activity Tolerance: Patient tolerated treatment well Patient left: in chair;with call bell/phone within reach;with chair alarm set Nurse Communication: Mobility status PT Visit Diagnosis: Other abnormalities of gait and mobility (R26.89);Muscle weakness (generalized) (M62.81)     Time: 1572-6203 PT Time Calculation (min) (ACUTE ONLY): 21 min  Charges:  $Gait Training: 8-22 mins                     [place] chat preferred Office [place] 07/15/2022, 10:42 AM

## 2022-07-16 DIAGNOSIS — E1142 Type 2 diabetes mellitus with diabetic polyneuropathy: Secondary | ICD-10-CM | POA: Diagnosis not present

## 2022-07-16 DIAGNOSIS — Z7985 Long-term (current) use of injectable non-insulin antidiabetic drugs: Secondary | ICD-10-CM | POA: Diagnosis not present

## 2022-07-16 DIAGNOSIS — E890 Postprocedural hypothyroidism: Secondary | ICD-10-CM | POA: Diagnosis not present

## 2022-07-16 DIAGNOSIS — I10 Essential (primary) hypertension: Secondary | ICD-10-CM | POA: Diagnosis not present

## 2022-07-16 DIAGNOSIS — D649 Anemia, unspecified: Secondary | ICD-10-CM | POA: Diagnosis not present

## 2022-07-16 DIAGNOSIS — E1136 Type 2 diabetes mellitus with diabetic cataract: Secondary | ICD-10-CM | POA: Diagnosis not present

## 2022-07-16 DIAGNOSIS — Z7984 Long term (current) use of oral hypoglycemic drugs: Secondary | ICD-10-CM | POA: Diagnosis not present

## 2022-07-16 DIAGNOSIS — Z794 Long term (current) use of insulin: Secondary | ICD-10-CM | POA: Diagnosis not present

## 2022-07-16 DIAGNOSIS — E049 Nontoxic goiter, unspecified: Secondary | ICD-10-CM | POA: Diagnosis not present

## 2022-07-16 DIAGNOSIS — M797 Fibromyalgia: Secondary | ICD-10-CM | POA: Diagnosis not present

## 2022-07-16 DIAGNOSIS — Z9181 History of falling: Secondary | ICD-10-CM | POA: Diagnosis not present

## 2022-07-16 DIAGNOSIS — E785 Hyperlipidemia, unspecified: Secondary | ICD-10-CM | POA: Diagnosis not present

## 2022-07-16 DIAGNOSIS — R739 Hyperglycemia, unspecified: Secondary | ICD-10-CM | POA: Diagnosis not present

## 2022-07-16 DIAGNOSIS — Z87891 Personal history of nicotine dependence: Secondary | ICD-10-CM | POA: Diagnosis not present

## 2022-07-16 DIAGNOSIS — E1151 Type 2 diabetes mellitus with diabetic peripheral angiopathy without gangrene: Secondary | ICD-10-CM | POA: Diagnosis not present

## 2022-07-22 DIAGNOSIS — E1122 Type 2 diabetes mellitus with diabetic chronic kidney disease: Secondary | ICD-10-CM | POA: Diagnosis not present

## 2022-07-22 DIAGNOSIS — N1832 Chronic kidney disease, stage 3b: Secondary | ICD-10-CM | POA: Diagnosis not present

## 2022-07-22 DIAGNOSIS — D649 Anemia, unspecified: Secondary | ICD-10-CM | POA: Diagnosis not present

## 2022-07-22 DIAGNOSIS — I700 Atherosclerosis of aorta: Secondary | ICD-10-CM | POA: Diagnosis not present

## 2022-07-23 ENCOUNTER — Ambulatory Visit
Admission: RE | Admit: 2022-07-23 | Discharge: 2022-07-23 | Disposition: A | Discharge status: Home / Self Care | Payer: Medicare Other | Source: Ambulatory Visit | Attending: Physician Assistant | Admitting: Physician Assistant

## 2022-07-23 ENCOUNTER — Ambulatory Visit: Payer: Medicare Other

## 2022-07-23 DIAGNOSIS — N644 Mastodynia: Secondary | ICD-10-CM

## 2022-07-29 ENCOUNTER — Ambulatory Visit: Payer: Medicare Other

## 2022-07-30 DIAGNOSIS — E11319 Type 2 diabetes mellitus with unspecified diabetic retinopathy without macular edema: Secondary | ICD-10-CM | POA: Diagnosis not present

## 2022-07-30 DIAGNOSIS — M8580 Other specified disorders of bone density and structure, unspecified site: Secondary | ICD-10-CM | POA: Diagnosis not present

## 2022-07-30 DIAGNOSIS — N1832 Chronic kidney disease, stage 3b: Secondary | ICD-10-CM | POA: Diagnosis not present

## 2022-07-30 DIAGNOSIS — I10 Essential (primary) hypertension: Secondary | ICD-10-CM | POA: Diagnosis not present

## 2022-07-30 DIAGNOSIS — E039 Hypothyroidism, unspecified: Secondary | ICD-10-CM | POA: Diagnosis not present

## 2022-07-30 DIAGNOSIS — E782 Mixed hyperlipidemia: Secondary | ICD-10-CM | POA: Diagnosis not present

## 2022-08-02 DIAGNOSIS — E785 Hyperlipidemia, unspecified: Secondary | ICD-10-CM | POA: Diagnosis not present

## 2022-08-02 DIAGNOSIS — R739 Hyperglycemia, unspecified: Secondary | ICD-10-CM | POA: Diagnosis not present

## 2022-08-02 DIAGNOSIS — D649 Anemia, unspecified: Secondary | ICD-10-CM | POA: Diagnosis not present

## 2022-08-02 DIAGNOSIS — E1142 Type 2 diabetes mellitus with diabetic polyneuropathy: Secondary | ICD-10-CM | POA: Diagnosis not present

## 2022-08-02 DIAGNOSIS — E1136 Type 2 diabetes mellitus with diabetic cataract: Secondary | ICD-10-CM | POA: Diagnosis not present

## 2022-08-02 DIAGNOSIS — E1151 Type 2 diabetes mellitus with diabetic peripheral angiopathy without gangrene: Secondary | ICD-10-CM | POA: Diagnosis not present

## 2022-08-02 DIAGNOSIS — E049 Nontoxic goiter, unspecified: Secondary | ICD-10-CM | POA: Diagnosis not present

## 2022-08-02 DIAGNOSIS — Z7985 Long-term (current) use of injectable non-insulin antidiabetic drugs: Secondary | ICD-10-CM | POA: Diagnosis not present

## 2022-08-02 DIAGNOSIS — I10 Essential (primary) hypertension: Secondary | ICD-10-CM | POA: Diagnosis not present

## 2022-08-02 DIAGNOSIS — Z9181 History of falling: Secondary | ICD-10-CM | POA: Diagnosis not present

## 2022-08-02 DIAGNOSIS — Z794 Long term (current) use of insulin: Secondary | ICD-10-CM | POA: Diagnosis not present

## 2022-08-02 DIAGNOSIS — M797 Fibromyalgia: Secondary | ICD-10-CM | POA: Diagnosis not present

## 2022-08-02 DIAGNOSIS — E890 Postprocedural hypothyroidism: Secondary | ICD-10-CM | POA: Diagnosis not present

## 2022-08-02 DIAGNOSIS — Z87891 Personal history of nicotine dependence: Secondary | ICD-10-CM | POA: Diagnosis not present

## 2022-08-02 DIAGNOSIS — Z7984 Long term (current) use of oral hypoglycemic drugs: Secondary | ICD-10-CM | POA: Diagnosis not present

## 2022-08-09 DIAGNOSIS — Z87891 Personal history of nicotine dependence: Secondary | ICD-10-CM | POA: Diagnosis not present

## 2022-08-09 DIAGNOSIS — E785 Hyperlipidemia, unspecified: Secondary | ICD-10-CM | POA: Diagnosis not present

## 2022-08-09 DIAGNOSIS — Z7985 Long-term (current) use of injectable non-insulin antidiabetic drugs: Secondary | ICD-10-CM | POA: Diagnosis not present

## 2022-08-09 DIAGNOSIS — D649 Anemia, unspecified: Secondary | ICD-10-CM | POA: Diagnosis not present

## 2022-08-09 DIAGNOSIS — E1151 Type 2 diabetes mellitus with diabetic peripheral angiopathy without gangrene: Secondary | ICD-10-CM | POA: Diagnosis not present

## 2022-08-09 DIAGNOSIS — M797 Fibromyalgia: Secondary | ICD-10-CM | POA: Diagnosis not present

## 2022-08-09 DIAGNOSIS — Z794 Long term (current) use of insulin: Secondary | ICD-10-CM | POA: Diagnosis not present

## 2022-08-09 DIAGNOSIS — Z7984 Long term (current) use of oral hypoglycemic drugs: Secondary | ICD-10-CM | POA: Diagnosis not present

## 2022-08-09 DIAGNOSIS — E1142 Type 2 diabetes mellitus with diabetic polyneuropathy: Secondary | ICD-10-CM | POA: Diagnosis not present

## 2022-08-09 DIAGNOSIS — E049 Nontoxic goiter, unspecified: Secondary | ICD-10-CM | POA: Diagnosis not present

## 2022-08-09 DIAGNOSIS — E890 Postprocedural hypothyroidism: Secondary | ICD-10-CM | POA: Diagnosis not present

## 2022-08-09 DIAGNOSIS — R739 Hyperglycemia, unspecified: Secondary | ICD-10-CM | POA: Diagnosis not present

## 2022-08-09 DIAGNOSIS — I10 Essential (primary) hypertension: Secondary | ICD-10-CM | POA: Diagnosis not present

## 2022-08-09 DIAGNOSIS — Z9181 History of falling: Secondary | ICD-10-CM | POA: Diagnosis not present

## 2022-08-09 DIAGNOSIS — E1136 Type 2 diabetes mellitus with diabetic cataract: Secondary | ICD-10-CM | POA: Diagnosis not present

## 2022-08-16 DIAGNOSIS — E1142 Type 2 diabetes mellitus with diabetic polyneuropathy: Secondary | ICD-10-CM | POA: Diagnosis not present

## 2022-08-16 DIAGNOSIS — E890 Postprocedural hypothyroidism: Secondary | ICD-10-CM | POA: Diagnosis not present

## 2022-08-16 DIAGNOSIS — Z794 Long term (current) use of insulin: Secondary | ICD-10-CM | POA: Diagnosis not present

## 2022-08-16 DIAGNOSIS — R739 Hyperglycemia, unspecified: Secondary | ICD-10-CM | POA: Diagnosis not present

## 2022-08-16 DIAGNOSIS — E049 Nontoxic goiter, unspecified: Secondary | ICD-10-CM | POA: Diagnosis not present

## 2022-08-16 DIAGNOSIS — I10 Essential (primary) hypertension: Secondary | ICD-10-CM | POA: Diagnosis not present

## 2022-08-16 DIAGNOSIS — D649 Anemia, unspecified: Secondary | ICD-10-CM | POA: Diagnosis not present

## 2022-08-16 DIAGNOSIS — M797 Fibromyalgia: Secondary | ICD-10-CM | POA: Diagnosis not present

## 2022-08-16 DIAGNOSIS — E785 Hyperlipidemia, unspecified: Secondary | ICD-10-CM | POA: Diagnosis not present

## 2022-08-16 DIAGNOSIS — E1136 Type 2 diabetes mellitus with diabetic cataract: Secondary | ICD-10-CM | POA: Diagnosis not present

## 2022-08-16 DIAGNOSIS — E1151 Type 2 diabetes mellitus with diabetic peripheral angiopathy without gangrene: Secondary | ICD-10-CM | POA: Diagnosis not present

## 2022-08-16 DIAGNOSIS — Z87891 Personal history of nicotine dependence: Secondary | ICD-10-CM | POA: Diagnosis not present

## 2022-08-16 DIAGNOSIS — Z7985 Long-term (current) use of injectable non-insulin antidiabetic drugs: Secondary | ICD-10-CM | POA: Diagnosis not present

## 2022-08-16 DIAGNOSIS — Z7984 Long term (current) use of oral hypoglycemic drugs: Secondary | ICD-10-CM | POA: Diagnosis not present

## 2022-08-16 DIAGNOSIS — Z9181 History of falling: Secondary | ICD-10-CM | POA: Diagnosis not present

## 2022-08-23 DIAGNOSIS — E1151 Type 2 diabetes mellitus with diabetic peripheral angiopathy without gangrene: Secondary | ICD-10-CM | POA: Diagnosis not present

## 2022-08-23 DIAGNOSIS — Z7985 Long-term (current) use of injectable non-insulin antidiabetic drugs: Secondary | ICD-10-CM | POA: Diagnosis not present

## 2022-08-23 DIAGNOSIS — Z794 Long term (current) use of insulin: Secondary | ICD-10-CM | POA: Diagnosis not present

## 2022-08-23 DIAGNOSIS — R739 Hyperglycemia, unspecified: Secondary | ICD-10-CM | POA: Diagnosis not present

## 2022-08-23 DIAGNOSIS — E1142 Type 2 diabetes mellitus with diabetic polyneuropathy: Secondary | ICD-10-CM | POA: Diagnosis not present

## 2022-08-23 DIAGNOSIS — E1136 Type 2 diabetes mellitus with diabetic cataract: Secondary | ICD-10-CM | POA: Diagnosis not present

## 2022-08-23 DIAGNOSIS — D649 Anemia, unspecified: Secondary | ICD-10-CM | POA: Diagnosis not present

## 2022-08-23 DIAGNOSIS — E890 Postprocedural hypothyroidism: Secondary | ICD-10-CM | POA: Diagnosis not present

## 2022-08-23 DIAGNOSIS — E049 Nontoxic goiter, unspecified: Secondary | ICD-10-CM | POA: Diagnosis not present

## 2022-08-23 DIAGNOSIS — M797 Fibromyalgia: Secondary | ICD-10-CM | POA: Diagnosis not present

## 2022-08-23 DIAGNOSIS — Z7984 Long term (current) use of oral hypoglycemic drugs: Secondary | ICD-10-CM | POA: Diagnosis not present

## 2022-08-23 DIAGNOSIS — Z9181 History of falling: Secondary | ICD-10-CM | POA: Diagnosis not present

## 2022-08-23 DIAGNOSIS — I10 Essential (primary) hypertension: Secondary | ICD-10-CM | POA: Diagnosis not present

## 2022-08-23 DIAGNOSIS — E785 Hyperlipidemia, unspecified: Secondary | ICD-10-CM | POA: Diagnosis not present

## 2022-08-23 DIAGNOSIS — Z87891 Personal history of nicotine dependence: Secondary | ICD-10-CM | POA: Diagnosis not present

## 2022-08-30 DIAGNOSIS — E890 Postprocedural hypothyroidism: Secondary | ICD-10-CM | POA: Diagnosis not present

## 2022-08-30 DIAGNOSIS — E1151 Type 2 diabetes mellitus with diabetic peripheral angiopathy without gangrene: Secondary | ICD-10-CM | POA: Diagnosis not present

## 2022-08-30 DIAGNOSIS — E785 Hyperlipidemia, unspecified: Secondary | ICD-10-CM | POA: Diagnosis not present

## 2022-08-30 DIAGNOSIS — E049 Nontoxic goiter, unspecified: Secondary | ICD-10-CM | POA: Diagnosis not present

## 2022-08-30 DIAGNOSIS — Z7984 Long term (current) use of oral hypoglycemic drugs: Secondary | ICD-10-CM | POA: Diagnosis not present

## 2022-08-30 DIAGNOSIS — I10 Essential (primary) hypertension: Secondary | ICD-10-CM | POA: Diagnosis not present

## 2022-08-30 DIAGNOSIS — M797 Fibromyalgia: Secondary | ICD-10-CM | POA: Diagnosis not present

## 2022-08-30 DIAGNOSIS — Z87891 Personal history of nicotine dependence: Secondary | ICD-10-CM | POA: Diagnosis not present

## 2022-08-30 DIAGNOSIS — Z794 Long term (current) use of insulin: Secondary | ICD-10-CM | POA: Diagnosis not present

## 2022-08-30 DIAGNOSIS — E1142 Type 2 diabetes mellitus with diabetic polyneuropathy: Secondary | ICD-10-CM | POA: Diagnosis not present

## 2022-08-30 DIAGNOSIS — Z7985 Long-term (current) use of injectable non-insulin antidiabetic drugs: Secondary | ICD-10-CM | POA: Diagnosis not present

## 2022-08-30 DIAGNOSIS — E1136 Type 2 diabetes mellitus with diabetic cataract: Secondary | ICD-10-CM | POA: Diagnosis not present

## 2022-08-30 DIAGNOSIS — Z9181 History of falling: Secondary | ICD-10-CM | POA: Diagnosis not present

## 2022-08-30 DIAGNOSIS — R739 Hyperglycemia, unspecified: Secondary | ICD-10-CM | POA: Diagnosis not present

## 2022-08-30 DIAGNOSIS — D649 Anemia, unspecified: Secondary | ICD-10-CM | POA: Diagnosis not present

## 2022-09-06 DIAGNOSIS — R1084 Generalized abdominal pain: Secondary | ICD-10-CM | POA: Diagnosis not present

## 2022-09-06 DIAGNOSIS — D509 Iron deficiency anemia, unspecified: Secondary | ICD-10-CM | POA: Diagnosis not present

## 2022-09-10 DIAGNOSIS — R739 Hyperglycemia, unspecified: Secondary | ICD-10-CM | POA: Diagnosis not present

## 2022-09-10 DIAGNOSIS — E1151 Type 2 diabetes mellitus with diabetic peripheral angiopathy without gangrene: Secondary | ICD-10-CM | POA: Diagnosis not present

## 2022-09-10 DIAGNOSIS — E785 Hyperlipidemia, unspecified: Secondary | ICD-10-CM | POA: Diagnosis not present

## 2022-09-10 DIAGNOSIS — M797 Fibromyalgia: Secondary | ICD-10-CM | POA: Diagnosis not present

## 2022-09-10 DIAGNOSIS — Z794 Long term (current) use of insulin: Secondary | ICD-10-CM | POA: Diagnosis not present

## 2022-09-10 DIAGNOSIS — E049 Nontoxic goiter, unspecified: Secondary | ICD-10-CM | POA: Diagnosis not present

## 2022-09-10 DIAGNOSIS — E1142 Type 2 diabetes mellitus with diabetic polyneuropathy: Secondary | ICD-10-CM | POA: Diagnosis not present

## 2022-09-10 DIAGNOSIS — E1136 Type 2 diabetes mellitus with diabetic cataract: Secondary | ICD-10-CM | POA: Diagnosis not present

## 2022-09-10 DIAGNOSIS — Z7984 Long term (current) use of oral hypoglycemic drugs: Secondary | ICD-10-CM | POA: Diagnosis not present

## 2022-09-10 DIAGNOSIS — E890 Postprocedural hypothyroidism: Secondary | ICD-10-CM | POA: Diagnosis not present

## 2022-09-10 DIAGNOSIS — Z7985 Long-term (current) use of injectable non-insulin antidiabetic drugs: Secondary | ICD-10-CM | POA: Diagnosis not present

## 2022-09-10 DIAGNOSIS — D649 Anemia, unspecified: Secondary | ICD-10-CM | POA: Diagnosis not present

## 2022-09-10 DIAGNOSIS — I10 Essential (primary) hypertension: Secondary | ICD-10-CM | POA: Diagnosis not present

## 2022-09-10 DIAGNOSIS — Z87891 Personal history of nicotine dependence: Secondary | ICD-10-CM | POA: Diagnosis not present

## 2022-09-10 DIAGNOSIS — Z9181 History of falling: Secondary | ICD-10-CM | POA: Diagnosis not present

## 2022-09-13 DIAGNOSIS — E7800 Pure hypercholesterolemia, unspecified: Secondary | ICD-10-CM | POA: Diagnosis not present

## 2022-09-13 DIAGNOSIS — M8580 Other specified disorders of bone density and structure, unspecified site: Secondary | ICD-10-CM | POA: Diagnosis not present

## 2022-09-13 DIAGNOSIS — I700 Atherosclerosis of aorta: Secondary | ICD-10-CM | POA: Diagnosis not present

## 2022-09-13 DIAGNOSIS — I10 Essential (primary) hypertension: Secondary | ICD-10-CM | POA: Diagnosis not present

## 2022-09-13 DIAGNOSIS — G8929 Other chronic pain: Secondary | ICD-10-CM | POA: Diagnosis not present

## 2022-09-13 DIAGNOSIS — E1165 Type 2 diabetes mellitus with hyperglycemia: Secondary | ICD-10-CM | POA: Diagnosis not present

## 2022-09-13 DIAGNOSIS — G629 Polyneuropathy, unspecified: Secondary | ICD-10-CM | POA: Diagnosis not present

## 2022-09-13 DIAGNOSIS — E039 Hypothyroidism, unspecified: Secondary | ICD-10-CM | POA: Diagnosis not present

## 2022-09-13 DIAGNOSIS — M25511 Pain in right shoulder: Secondary | ICD-10-CM | POA: Diagnosis not present

## 2022-09-13 DIAGNOSIS — N1832 Chronic kidney disease, stage 3b: Secondary | ICD-10-CM | POA: Diagnosis not present

## 2022-09-13 DIAGNOSIS — E1142 Type 2 diabetes mellitus with diabetic polyneuropathy: Secondary | ICD-10-CM | POA: Diagnosis not present

## 2022-09-13 DIAGNOSIS — E1122 Type 2 diabetes mellitus with diabetic chronic kidney disease: Secondary | ICD-10-CM | POA: Diagnosis not present

## 2022-09-20 DIAGNOSIS — D649 Anemia, unspecified: Secondary | ICD-10-CM | POA: Diagnosis not present

## 2022-09-20 DIAGNOSIS — Z7984 Long term (current) use of oral hypoglycemic drugs: Secondary | ICD-10-CM | POA: Diagnosis not present

## 2022-09-20 DIAGNOSIS — Z79891 Long term (current) use of opiate analgesic: Secondary | ICD-10-CM | POA: Diagnosis not present

## 2022-09-20 DIAGNOSIS — E1151 Type 2 diabetes mellitus with diabetic peripheral angiopathy without gangrene: Secondary | ICD-10-CM | POA: Diagnosis not present

## 2022-09-20 DIAGNOSIS — E890 Postprocedural hypothyroidism: Secondary | ICD-10-CM | POA: Diagnosis not present

## 2022-09-20 DIAGNOSIS — E049 Nontoxic goiter, unspecified: Secondary | ICD-10-CM | POA: Diagnosis not present

## 2022-09-20 DIAGNOSIS — M797 Fibromyalgia: Secondary | ICD-10-CM | POA: Diagnosis not present

## 2022-09-20 DIAGNOSIS — Z794 Long term (current) use of insulin: Secondary | ICD-10-CM | POA: Diagnosis not present

## 2022-09-20 DIAGNOSIS — I10 Essential (primary) hypertension: Secondary | ICD-10-CM | POA: Diagnosis not present

## 2022-09-20 DIAGNOSIS — E1142 Type 2 diabetes mellitus with diabetic polyneuropathy: Secondary | ICD-10-CM | POA: Diagnosis not present

## 2022-09-20 DIAGNOSIS — Z7985 Long-term (current) use of injectable non-insulin antidiabetic drugs: Secondary | ICD-10-CM | POA: Diagnosis not present

## 2022-09-20 DIAGNOSIS — E785 Hyperlipidemia, unspecified: Secondary | ICD-10-CM | POA: Diagnosis not present

## 2022-09-20 DIAGNOSIS — E1136 Type 2 diabetes mellitus with diabetic cataract: Secondary | ICD-10-CM | POA: Diagnosis not present

## 2022-09-23 DIAGNOSIS — M25511 Pain in right shoulder: Secondary | ICD-10-CM | POA: Diagnosis not present

## 2022-09-25 DIAGNOSIS — E1151 Type 2 diabetes mellitus with diabetic peripheral angiopathy without gangrene: Secondary | ICD-10-CM | POA: Diagnosis not present

## 2022-09-25 DIAGNOSIS — M797 Fibromyalgia: Secondary | ICD-10-CM | POA: Diagnosis not present

## 2022-09-25 DIAGNOSIS — Z7985 Long-term (current) use of injectable non-insulin antidiabetic drugs: Secondary | ICD-10-CM | POA: Diagnosis not present

## 2022-09-25 DIAGNOSIS — Z79891 Long term (current) use of opiate analgesic: Secondary | ICD-10-CM | POA: Diagnosis not present

## 2022-09-25 DIAGNOSIS — E1142 Type 2 diabetes mellitus with diabetic polyneuropathy: Secondary | ICD-10-CM | POA: Diagnosis not present

## 2022-09-25 DIAGNOSIS — Z7984 Long term (current) use of oral hypoglycemic drugs: Secondary | ICD-10-CM | POA: Diagnosis not present

## 2022-09-25 DIAGNOSIS — E049 Nontoxic goiter, unspecified: Secondary | ICD-10-CM | POA: Diagnosis not present

## 2022-09-25 DIAGNOSIS — Z794 Long term (current) use of insulin: Secondary | ICD-10-CM | POA: Diagnosis not present

## 2022-09-25 DIAGNOSIS — D649 Anemia, unspecified: Secondary | ICD-10-CM | POA: Diagnosis not present

## 2022-09-25 DIAGNOSIS — E785 Hyperlipidemia, unspecified: Secondary | ICD-10-CM | POA: Diagnosis not present

## 2022-09-25 DIAGNOSIS — I10 Essential (primary) hypertension: Secondary | ICD-10-CM | POA: Diagnosis not present

## 2022-09-25 DIAGNOSIS — E890 Postprocedural hypothyroidism: Secondary | ICD-10-CM | POA: Diagnosis not present

## 2022-09-25 DIAGNOSIS — E1136 Type 2 diabetes mellitus with diabetic cataract: Secondary | ICD-10-CM | POA: Diagnosis not present

## 2022-10-03 DIAGNOSIS — E785 Hyperlipidemia, unspecified: Secondary | ICD-10-CM | POA: Diagnosis not present

## 2022-10-03 DIAGNOSIS — E1142 Type 2 diabetes mellitus with diabetic polyneuropathy: Secondary | ICD-10-CM | POA: Diagnosis not present

## 2022-10-03 DIAGNOSIS — M797 Fibromyalgia: Secondary | ICD-10-CM | POA: Diagnosis not present

## 2022-10-03 DIAGNOSIS — E049 Nontoxic goiter, unspecified: Secondary | ICD-10-CM | POA: Diagnosis not present

## 2022-10-03 DIAGNOSIS — E890 Postprocedural hypothyroidism: Secondary | ICD-10-CM | POA: Diagnosis not present

## 2022-10-03 DIAGNOSIS — I10 Essential (primary) hypertension: Secondary | ICD-10-CM | POA: Diagnosis not present

## 2022-10-03 DIAGNOSIS — D649 Anemia, unspecified: Secondary | ICD-10-CM | POA: Diagnosis not present

## 2022-10-03 DIAGNOSIS — E1151 Type 2 diabetes mellitus with diabetic peripheral angiopathy without gangrene: Secondary | ICD-10-CM | POA: Diagnosis not present

## 2022-10-03 DIAGNOSIS — E1136 Type 2 diabetes mellitus with diabetic cataract: Secondary | ICD-10-CM | POA: Diagnosis not present

## 2022-10-08 DIAGNOSIS — K2210 Ulcer of esophagus without bleeding: Secondary | ICD-10-CM | POA: Diagnosis not present

## 2022-10-08 DIAGNOSIS — K640 First degree hemorrhoids: Secondary | ICD-10-CM | POA: Diagnosis not present

## 2022-10-08 DIAGNOSIS — K2100 Gastro-esophageal reflux disease with esophagitis, without bleeding: Secondary | ICD-10-CM | POA: Diagnosis not present

## 2022-10-08 DIAGNOSIS — K644 Residual hemorrhoidal skin tags: Secondary | ICD-10-CM | POA: Diagnosis not present

## 2022-10-08 DIAGNOSIS — K2970 Gastritis, unspecified, without bleeding: Secondary | ICD-10-CM | POA: Diagnosis not present

## 2022-10-08 DIAGNOSIS — D509 Iron deficiency anemia, unspecified: Secondary | ICD-10-CM | POA: Diagnosis not present

## 2022-10-08 DIAGNOSIS — K2940 Chronic atrophic gastritis without bleeding: Secondary | ICD-10-CM | POA: Diagnosis not present

## 2022-10-08 DIAGNOSIS — R1013 Epigastric pain: Secondary | ICD-10-CM | POA: Diagnosis not present

## 2022-10-08 DIAGNOSIS — D124 Benign neoplasm of descending colon: Secondary | ICD-10-CM | POA: Diagnosis not present

## 2022-10-08 DIAGNOSIS — K2930 Chronic superficial gastritis without bleeding: Secondary | ICD-10-CM | POA: Diagnosis not present

## 2022-10-11 DIAGNOSIS — D649 Anemia, unspecified: Secondary | ICD-10-CM | POA: Diagnosis not present

## 2022-10-11 DIAGNOSIS — E1136 Type 2 diabetes mellitus with diabetic cataract: Secondary | ICD-10-CM | POA: Diagnosis not present

## 2022-10-11 DIAGNOSIS — E785 Hyperlipidemia, unspecified: Secondary | ICD-10-CM | POA: Diagnosis not present

## 2022-10-11 DIAGNOSIS — E890 Postprocedural hypothyroidism: Secondary | ICD-10-CM | POA: Diagnosis not present

## 2022-10-11 DIAGNOSIS — E1142 Type 2 diabetes mellitus with diabetic polyneuropathy: Secondary | ICD-10-CM | POA: Diagnosis not present

## 2022-10-11 DIAGNOSIS — I10 Essential (primary) hypertension: Secondary | ICD-10-CM | POA: Diagnosis not present

## 2022-10-11 DIAGNOSIS — E049 Nontoxic goiter, unspecified: Secondary | ICD-10-CM | POA: Diagnosis not present

## 2022-10-11 DIAGNOSIS — M797 Fibromyalgia: Secondary | ICD-10-CM | POA: Diagnosis not present

## 2022-10-11 DIAGNOSIS — E1151 Type 2 diabetes mellitus with diabetic peripheral angiopathy without gangrene: Secondary | ICD-10-CM | POA: Diagnosis not present

## 2022-10-11 DIAGNOSIS — K2100 Gastro-esophageal reflux disease with esophagitis, without bleeding: Secondary | ICD-10-CM | POA: Diagnosis not present

## 2022-10-11 DIAGNOSIS — D124 Benign neoplasm of descending colon: Secondary | ICD-10-CM | POA: Diagnosis not present

## 2022-10-11 DIAGNOSIS — K2940 Chronic atrophic gastritis without bleeding: Secondary | ICD-10-CM | POA: Diagnosis not present

## 2022-10-18 DIAGNOSIS — I10 Essential (primary) hypertension: Secondary | ICD-10-CM | POA: Diagnosis not present

## 2022-10-18 DIAGNOSIS — E049 Nontoxic goiter, unspecified: Secondary | ICD-10-CM | POA: Diagnosis not present

## 2022-10-18 DIAGNOSIS — E785 Hyperlipidemia, unspecified: Secondary | ICD-10-CM | POA: Diagnosis not present

## 2022-10-18 DIAGNOSIS — E890 Postprocedural hypothyroidism: Secondary | ICD-10-CM | POA: Diagnosis not present

## 2022-10-18 DIAGNOSIS — D649 Anemia, unspecified: Secondary | ICD-10-CM | POA: Diagnosis not present

## 2022-10-18 DIAGNOSIS — E1142 Type 2 diabetes mellitus with diabetic polyneuropathy: Secondary | ICD-10-CM | POA: Diagnosis not present

## 2022-10-18 DIAGNOSIS — E1136 Type 2 diabetes mellitus with diabetic cataract: Secondary | ICD-10-CM | POA: Diagnosis not present

## 2022-10-18 DIAGNOSIS — E1151 Type 2 diabetes mellitus with diabetic peripheral angiopathy without gangrene: Secondary | ICD-10-CM | POA: Diagnosis not present

## 2022-10-18 DIAGNOSIS — M797 Fibromyalgia: Secondary | ICD-10-CM | POA: Diagnosis not present

## 2022-10-22 DIAGNOSIS — L603 Nail dystrophy: Secondary | ICD-10-CM | POA: Diagnosis not present

## 2022-10-22 DIAGNOSIS — E1151 Type 2 diabetes mellitus with diabetic peripheral angiopathy without gangrene: Secondary | ICD-10-CM | POA: Diagnosis not present

## 2022-10-22 DIAGNOSIS — B351 Tinea unguium: Secondary | ICD-10-CM | POA: Diagnosis not present

## 2022-10-25 DIAGNOSIS — M797 Fibromyalgia: Secondary | ICD-10-CM | POA: Diagnosis not present

## 2022-10-25 DIAGNOSIS — D649 Anemia, unspecified: Secondary | ICD-10-CM | POA: Diagnosis not present

## 2022-10-25 DIAGNOSIS — E1136 Type 2 diabetes mellitus with diabetic cataract: Secondary | ICD-10-CM | POA: Diagnosis not present

## 2022-10-25 DIAGNOSIS — E049 Nontoxic goiter, unspecified: Secondary | ICD-10-CM | POA: Diagnosis not present

## 2022-10-25 DIAGNOSIS — E1151 Type 2 diabetes mellitus with diabetic peripheral angiopathy without gangrene: Secondary | ICD-10-CM | POA: Diagnosis not present

## 2022-10-25 DIAGNOSIS — E1142 Type 2 diabetes mellitus with diabetic polyneuropathy: Secondary | ICD-10-CM | POA: Diagnosis not present

## 2022-10-25 DIAGNOSIS — E890 Postprocedural hypothyroidism: Secondary | ICD-10-CM | POA: Diagnosis not present

## 2022-10-25 DIAGNOSIS — E785 Hyperlipidemia, unspecified: Secondary | ICD-10-CM | POA: Diagnosis not present

## 2022-10-25 DIAGNOSIS — I10 Essential (primary) hypertension: Secondary | ICD-10-CM | POA: Diagnosis not present

## 2022-11-04 DIAGNOSIS — E1122 Type 2 diabetes mellitus with diabetic chronic kidney disease: Secondary | ICD-10-CM | POA: Diagnosis not present

## 2022-11-08 DIAGNOSIS — E1136 Type 2 diabetes mellitus with diabetic cataract: Secondary | ICD-10-CM | POA: Diagnosis not present

## 2022-11-08 DIAGNOSIS — D649 Anemia, unspecified: Secondary | ICD-10-CM | POA: Diagnosis not present

## 2022-11-08 DIAGNOSIS — E1151 Type 2 diabetes mellitus with diabetic peripheral angiopathy without gangrene: Secondary | ICD-10-CM | POA: Diagnosis not present

## 2022-11-08 DIAGNOSIS — M797 Fibromyalgia: Secondary | ICD-10-CM | POA: Diagnosis not present

## 2022-11-08 DIAGNOSIS — I10 Essential (primary) hypertension: Secondary | ICD-10-CM | POA: Diagnosis not present

## 2022-11-08 DIAGNOSIS — E785 Hyperlipidemia, unspecified: Secondary | ICD-10-CM | POA: Diagnosis not present

## 2022-11-08 DIAGNOSIS — E1142 Type 2 diabetes mellitus with diabetic polyneuropathy: Secondary | ICD-10-CM | POA: Diagnosis not present

## 2022-11-08 DIAGNOSIS — E890 Postprocedural hypothyroidism: Secondary | ICD-10-CM | POA: Diagnosis not present

## 2022-11-08 DIAGNOSIS — E049 Nontoxic goiter, unspecified: Secondary | ICD-10-CM | POA: Diagnosis not present

## 2022-11-15 DIAGNOSIS — E1151 Type 2 diabetes mellitus with diabetic peripheral angiopathy without gangrene: Secondary | ICD-10-CM | POA: Diagnosis not present

## 2022-11-15 DIAGNOSIS — E1142 Type 2 diabetes mellitus with diabetic polyneuropathy: Secondary | ICD-10-CM | POA: Diagnosis not present

## 2022-11-15 DIAGNOSIS — E1136 Type 2 diabetes mellitus with diabetic cataract: Secondary | ICD-10-CM | POA: Diagnosis not present

## 2022-11-15 DIAGNOSIS — E890 Postprocedural hypothyroidism: Secondary | ICD-10-CM | POA: Diagnosis not present

## 2022-11-15 DIAGNOSIS — I10 Essential (primary) hypertension: Secondary | ICD-10-CM | POA: Diagnosis not present

## 2022-11-15 DIAGNOSIS — M797 Fibromyalgia: Secondary | ICD-10-CM | POA: Diagnosis not present

## 2022-11-15 DIAGNOSIS — E049 Nontoxic goiter, unspecified: Secondary | ICD-10-CM | POA: Diagnosis not present

## 2022-11-15 DIAGNOSIS — D649 Anemia, unspecified: Secondary | ICD-10-CM | POA: Diagnosis not present

## 2022-11-15 DIAGNOSIS — E785 Hyperlipidemia, unspecified: Secondary | ICD-10-CM | POA: Diagnosis not present

## 2022-11-22 DIAGNOSIS — E1151 Type 2 diabetes mellitus with diabetic peripheral angiopathy without gangrene: Secondary | ICD-10-CM | POA: Diagnosis not present

## 2022-11-22 DIAGNOSIS — D649 Anemia, unspecified: Secondary | ICD-10-CM | POA: Diagnosis not present

## 2022-11-22 DIAGNOSIS — E1136 Type 2 diabetes mellitus with diabetic cataract: Secondary | ICD-10-CM | POA: Diagnosis not present

## 2022-11-22 DIAGNOSIS — E890 Postprocedural hypothyroidism: Secondary | ICD-10-CM | POA: Diagnosis not present

## 2022-11-22 DIAGNOSIS — E049 Nontoxic goiter, unspecified: Secondary | ICD-10-CM | POA: Diagnosis not present

## 2022-11-22 DIAGNOSIS — E1142 Type 2 diabetes mellitus with diabetic polyneuropathy: Secondary | ICD-10-CM | POA: Diagnosis not present

## 2022-11-22 DIAGNOSIS — I10 Essential (primary) hypertension: Secondary | ICD-10-CM | POA: Diagnosis not present

## 2022-11-22 DIAGNOSIS — E785 Hyperlipidemia, unspecified: Secondary | ICD-10-CM | POA: Diagnosis not present

## 2022-11-22 DIAGNOSIS — M797 Fibromyalgia: Secondary | ICD-10-CM | POA: Diagnosis not present

## 2022-11-27 DIAGNOSIS — D649 Anemia, unspecified: Secondary | ICD-10-CM | POA: Diagnosis not present

## 2022-11-27 DIAGNOSIS — E1142 Type 2 diabetes mellitus with diabetic polyneuropathy: Secondary | ICD-10-CM | POA: Diagnosis not present

## 2022-11-27 DIAGNOSIS — E785 Hyperlipidemia, unspecified: Secondary | ICD-10-CM | POA: Diagnosis not present

## 2022-11-27 DIAGNOSIS — M797 Fibromyalgia: Secondary | ICD-10-CM | POA: Diagnosis not present

## 2022-11-27 DIAGNOSIS — I10 Essential (primary) hypertension: Secondary | ICD-10-CM | POA: Diagnosis not present

## 2022-11-27 DIAGNOSIS — E1151 Type 2 diabetes mellitus with diabetic peripheral angiopathy without gangrene: Secondary | ICD-10-CM | POA: Diagnosis not present

## 2022-11-27 DIAGNOSIS — E890 Postprocedural hypothyroidism: Secondary | ICD-10-CM | POA: Diagnosis not present

## 2022-11-27 DIAGNOSIS — E049 Nontoxic goiter, unspecified: Secondary | ICD-10-CM | POA: Diagnosis not present

## 2022-11-27 DIAGNOSIS — E1136 Type 2 diabetes mellitus with diabetic cataract: Secondary | ICD-10-CM | POA: Diagnosis not present

## 2022-11-29 DIAGNOSIS — M25511 Pain in right shoulder: Secondary | ICD-10-CM | POA: Diagnosis not present

## 2022-12-04 DIAGNOSIS — E1122 Type 2 diabetes mellitus with diabetic chronic kidney disease: Secondary | ICD-10-CM | POA: Diagnosis not present

## 2022-12-08 ENCOUNTER — Encounter (HOSPITAL_COMMUNITY): Payer: Self-pay | Admitting: Emergency Medicine

## 2022-12-08 ENCOUNTER — Other Ambulatory Visit: Payer: Self-pay

## 2022-12-08 ENCOUNTER — Emergency Department (HOSPITAL_COMMUNITY): Payer: Medicare PPO

## 2022-12-08 ENCOUNTER — Emergency Department (HOSPITAL_COMMUNITY)
Admission: EM | Admit: 2022-12-08 | Discharge: 2022-12-08 | Disposition: A | Discharge status: Home / Self Care | Payer: Medicare PPO | Attending: Emergency Medicine | Admitting: Emergency Medicine

## 2022-12-08 DIAGNOSIS — R519 Headache, unspecified: Secondary | ICD-10-CM | POA: Diagnosis not present

## 2022-12-08 DIAGNOSIS — R739 Hyperglycemia, unspecified: Secondary | ICD-10-CM | POA: Insufficient documentation

## 2022-12-08 DIAGNOSIS — D649 Anemia, unspecified: Secondary | ICD-10-CM | POA: Diagnosis not present

## 2022-12-08 DIAGNOSIS — R404 Transient alteration of awareness: Secondary | ICD-10-CM | POA: Diagnosis not present

## 2022-12-08 DIAGNOSIS — I10 Essential (primary) hypertension: Secondary | ICD-10-CM | POA: Diagnosis not present

## 2022-12-08 DIAGNOSIS — M542 Cervicalgia: Secondary | ICD-10-CM | POA: Diagnosis not present

## 2022-12-08 DIAGNOSIS — Z743 Need for continuous supervision: Secondary | ICD-10-CM | POA: Diagnosis not present

## 2022-12-08 DIAGNOSIS — M25519 Pain in unspecified shoulder: Secondary | ICD-10-CM | POA: Diagnosis not present

## 2022-12-08 DIAGNOSIS — E1165 Type 2 diabetes mellitus with hyperglycemia: Secondary | ICD-10-CM | POA: Diagnosis not present

## 2022-12-08 LAB — COMPREHENSIVE METABOLIC PANEL
ALT: 20 U/L (ref 0–44)
AST: 19 U/L (ref 15–41)
Albumin: 3.6 g/dL (ref 3.5–5.0)
Alkaline Phosphatase: 98 U/L (ref 38–126)
Anion gap: 9 (ref 5–15)
BUN: 20 mg/dL (ref 8–23)
CO2: 24 mmol/L (ref 22–32)
Calcium: 8.5 mg/dL — ABNORMAL LOW (ref 8.9–10.3)
Chloride: 107 mmol/L (ref 98–111)
Creatinine, Ser: 0.98 mg/dL (ref 0.44–1.00)
GFR, Estimated: 58 mL/min — ABNORMAL LOW (ref >60)
Glucose, Bld: 217 mg/dL — ABNORMAL HIGH (ref 70–99)
Potassium: 3.6 mmol/L (ref 3.5–5.1)
Sodium: 140 mmol/L (ref 135–145)
Total Bilirubin: 0.7 mg/dL (ref 0.3–1.2)
Total Protein: 7.3 g/dL (ref 6.5–8.1)

## 2022-12-08 LAB — CBC
HCT: 32.5 % — ABNORMAL LOW (ref 36.0–46.0)
Hemoglobin: 9.6 g/dL — ABNORMAL LOW (ref 12.0–15.0)
MCH: 24.4 pg — ABNORMAL LOW (ref 26.0–34.0)
MCHC: 29.5 g/dL — ABNORMAL LOW (ref 30.0–36.0)
MCV: 82.7 fL (ref 80.0–100.0)
Platelets: 396 10*3/uL (ref 150–400)
RBC: 3.93 MIL/uL (ref 3.87–5.11)
RDW: 18.2 % — ABNORMAL HIGH (ref 11.5–15.5)
WBC: 7.1 10*3/uL (ref 4.0–10.5)
nRBC: 0 % (ref 0.0–0.2)

## 2022-12-08 MED ORDER — ACETAMINOPHEN 325 MG PO TABS
650.0000 mg | ORAL_TABLET | Freq: Once | ORAL | Status: AC
  Administered 2022-12-08: 650 mg via ORAL
  Filled 2022-12-08: qty 2

## 2022-12-08 MED ORDER — VALSARTAN-HYDROCHLOROTHIAZIDE 160-12.5 MG PO TABS: 1.0000 | ORAL_TABLET | Freq: Every day | ORAL | Status: DC

## 2022-12-08 MED ORDER — HYDROCHLOROTHIAZIDE 12.5 MG PO TABS
12.5000 mg | ORAL_TABLET | Freq: Every day | ORAL | Status: DC
  Administered 2022-12-08: 12.5 mg via ORAL
  Filled 2022-12-08: qty 1

## 2022-12-08 MED ORDER — AMLODIPINE BESYLATE 5 MG PO TABS
5.0000 mg | ORAL_TABLET | Freq: Every day | ORAL | Status: DC
  Administered 2022-12-08: 5 mg via ORAL
  Filled 2022-12-08: qty 1

## 2022-12-08 MED ORDER — TRAMADOL HCL 50 MG PO TABS
50.0000 mg | ORAL_TABLET | Freq: Once | ORAL | Status: AC
  Administered 2022-12-08: 50 mg via ORAL
  Filled 2022-12-08: qty 1

## 2022-12-08 MED ORDER — IRBESARTAN 300 MG PO TABS
150.0000 mg | ORAL_TABLET | Freq: Every day | ORAL | Status: DC
  Administered 2022-12-08: 150 mg via ORAL
  Filled 2022-12-08: qty 1

## 2022-12-08 NOTE — ED Provider Triage Note (Signed)
Emergency Medicine Provider Triage Evaluation Note  Diane Knight , a 82 y.o. female  was evaluated in triage.  Pt complains of headache. She does not currently have a headache. States that it began initially on Friday after she ate lunch. Pain was throughout her head. It got better yesterday and then today was almost gone until EMS arrived when it resolved completely. She denies history of headaches previously. Asymptomatic at this time. Denies vision changes, photophobia, phonophobia, nausea, vomiting, weakness, dizziness, fevers, chills.  Review of Systems  Positive:  Negative:   Physical Exam  BP (!) 171/67   Pulse 66   Temp 98.1 F (36.7 C)   Resp 20   Ht 5\' 6"  (1.676 m)   Wt 90.3 kg   SpO2 100%   BMI 32.12 kg/m  Gen:   Awake, no distress   Resp:  Normal effort  MSK:   Moves extremities without difficulty  Other:  No focal deficits  Medical Decision Making  Medically screening exam initiated at 1:04 PM.  Appropriate orders placed.  Wilfrid Lund was informed that the remainder of the evaluation will be completed by another provider, this initial triage assessment does not replace that evaluation, and the importance of remaining in the ED until their evaluation is complete.     Silva Bandy, PA-C 12/08/22 1307

## 2022-12-08 NOTE — Discharge Instructions (Signed)
It was our pleasure to provide your ER care today - we hope that you feel better.  Drink plenty of fluids/stay well hydrated.   Take acetaminophen or excedrin as need for symptom relief.   Follow up closely with primary care doctor this coming week.  Have your blood pressure and blood sugar rechecked then, as high today.  Return to ER if worse, new symptoms, new/severe pain, chest pain, trouble breathing, fevers, or other concern.

## 2022-12-08 NOTE — ED Provider Notes (Signed)
[place] [facility]MENT AT [facility] Provider Note   CSN: [id number] Arrival date & time: 12/08/22  1232     History  Chief Complaint  Patient presents with   Headache    Diane Knight is a 82 y.o. female.  Pt c/o dull headache in past two days. Mainly on left. Notes hx headaches/'migraines'. Denies acute/abrupt or thunderclap type head pain. No neck pain or stiffness. No eye pain or change in vision. No associated numbness or weakness. No change in speech. No problems w balance, coordination or normal baseline functional ability. No sinus pain or drainage. No fever or chills. No syncope. No head trauma. Indicates has not taken any of her meds today, including her bp meds.   The history is provided by the patient, medical records and the EMS personnel.  Headache Associated symptoms: no abdominal pain, no cough, no eye pain, no fever, no nausea, no neck pain, no neck stiffness, no numbness, no sore throat, no vomiting and no weakness        Home Medications Prior to Admission medications   Medication Sig Start Date End Date Taking? Authorizing Provider  acetaminophen (TYLENOL) 500 MG tablet Take 500 mg by mouth every 6 (six) hours as needed for mild pain or headache.     [provider]  amLODipine (NORVASC) 5 MG tablet Take 5 mg by mouth daily. 04/13/22   [provider]  atorvastatin (LIPITOR) 10 MG tablet Take 10 mg by mouth daily.    [provider]  calcium carbonate (TUMS) 500 MG chewable tablet Chew 3 tablets (600 mg of elemental calcium total) by mouth 4 (four) times daily -  before meals and at bedtime. 08/06/17   Darnell Level, MD  Ferrous Sulfate (IRON) 325 (65 Fe) MG TABS Take 1 tablet (325 mg total) by mouth daily. 07/16/22   Joseph Art, DO  HUMALOG MIX 75/25 KWIKPEN (75-25) 100 UNIT/ML KwikPen Inject 50-55 Units into the skin. 55am 50pm 12/03/21   [provider]  levothyroxine (SYNTHROID) 125 MCG tablet Take 125  mcg by mouth daily before breakfast.    [provider]  lidocaine (HM LIDOCAINE PATCH) 4 % Place 1 patch onto the skin daily. 07/12/22   Raspet, Noberto Retort, PA-C  metFORMIN (GLUCOPHAGE-XR) 500 MG 24 hr tablet Take 100 mg by mouth every evening. 04/29/22   [provider]  TRULICITY 1.5 MG/0.5ML SOPN Inject 1.5 mg into the skin once a week. 04/13/22   [provider]  valsartan-hydrochlorothiazide (DIOVAN-HCT) 160-12.5 MG per tablet Take 1 tablet by mouth daily.    [provider]      Allergies    Metformin and related    Review of Systems   Review of Systems  Constitutional:  Negative for chills and fever.  HENT:  Negative for rhinorrhea, sinus pain and sore throat.   Eyes:  Negative for pain, redness and visual disturbance.  Respiratory:  Negative for cough and shortness of breath.   Cardiovascular:  Negative for chest pain.  Gastrointestinal:  Negative for abdominal pain, nausea and vomiting.  Genitourinary:  Negative for dysuria and flank pain.  Musculoskeletal:  Negative for neck pain and neck stiffness.  Skin:  Negative for rash.  Neurological:  Positive for headaches. Negative for syncope, speech difficulty, weakness and numbness.  Hematological:  Does not bruise/bleed easily.  Psychiatric/Behavioral:  Negative for confusion.     Physical Exam Updated Vital Signs BP (!) 199/79   Pulse (!) 59  Temp 97.8 F (36.6 C) (Oral)   Resp 17   Ht 1.676 m (5\' 6" )   Wt 90.3 kg   SpO2 100%   BMI 32.12 kg/m  Physical Exam Vitals and nursing note reviewed.  Constitutional:      Appearance: Normal appearance. She is well-developed.  HENT:     Head: Atraumatic.     Comments: No sinus or temporal tenderness.     Nose: Nose normal.     Mouth/Throat:     Mouth: Mucous membranes are moist.     Pharynx: Oropharynx is clear.  Eyes:     General: No scleral icterus.    Extraocular Movements: Extraocular movements intact.     Conjunctiva/sclera:  Conjunctivae normal.     Pupils: Pupils are equal, round, and reactive to light.  Neck:     Vascular: No carotid bruit.     Trachea: No tracheal deviation.     Comments: No stiffness or rigidity.  Cardiovascular:     Rate and Rhythm: Normal rate and regular rhythm.     Pulses: Normal pulses.     Heart sounds: Normal heart sounds. No murmur heard.    No friction rub. No gallop.  Pulmonary:     Effort: Pulmonary effort is normal. No respiratory distress.     Breath sounds: Normal breath sounds.  Abdominal:     General: Bowel sounds are normal. There is no distension.     Palpations: Abdomen is soft.     Tenderness: There is no abdominal tenderness.  Genitourinary:    Comments: No cva tenderness.  Musculoskeletal:        General: No swelling or tenderness.     Cervical back: Normal range of motion and neck supple. No rigidity or tenderness. No muscular tenderness.  Skin:    General: Skin is warm and dry.     Findings: No rash.  Neurological:     General: No focal deficit present.     Mental Status: She is alert.     Cranial Nerves: No cranial nerve deficit.     Comments: Alert, speech normal. Motor/sens grossly intact bil. Stre 5/5. Sens intact.   Psychiatric:        Mood and Affect: Mood normal.     ED Results / Procedures / Treatments   Labs (all labs ordered are listed, but only abnormal results are displayed) Results for orders placed or performed during the hospital encounter of 12/08/22  CBC  Result Value Ref Range   WBC 7.1 4.0 - 10.5 K/uL   RBC 3.93 3.87 - 5.11 MIL/uL   Hemoglobin 9.6 (L) 12.0 - 15.0 g/dL   HCT 16.1 (L) 09.6 - 04.5 %   MCV 82.7 80.0 - 100.0 fL   MCH 24.4 (L) 26.0 - 34.0 pg   MCHC 29.5 (L) 30.0 - 36.0 g/dL   RDW 40.9 (H) 81.1 - 91.4 %   Platelets 396 150 - 400 K/uL   nRBC 0.0 0.0 - 0.2 %  Comprehensive metabolic panel  Result Value Ref Range   Sodium 140 135 - 145 mmol/L   Potassium 3.6 3.5 - 5.1 mmol/L   Chloride 107 98 - 111 mmol/L   CO2  24 22 - 32 mmol/L   Glucose, Bld 217 (H) 70 - 99 mg/dL   BUN 20 8 - 23 mg/dL   Creatinine, Ser 7.82 0.44 - 1.00 mg/dL   Calcium 8.5 (L) 8.9 - 10.3 mg/dL   Total Protein 7.3 6.5 - 8.1 g/dL  Albumin 3.6 3.5 - 5.0 g/dL   AST 19 15 - 41 U/L   ALT 20 0 - 44 U/L   Alkaline Phosphatase 98 38 - 126 U/L   Total Bilirubin 0.7 0.3 - 1.2 mg/dL   GFR, Estimated 58 (L) >60 mL/min   Anion gap 9 5 - 15   CT Head Wo Contrast  Result Date: 12/08/2022 CLINICAL DATA:  Headache EXAM: CT HEAD WITHOUT CONTRAST TECHNIQUE: Contiguous axial images were obtained from the base of the skull through the vertex without intravenous contrast. RADIATION DOSE REDUCTION: This exam was performed according to the departmental dose-optimization program which includes automated exposure control, adjustment of the mA and/or kV according to patient size and/or use of iterative reconstruction technique. COMPARISON:  CT head 04/25/2018 FINDINGS: Brain: There is no acute intracranial hemorrhage, extra-axial fluid collection, or acute infarct Parenchymal volume is normal. The ventricles are normal in size. Gray-white differentiation is preserved. Patchy hypodensity in the supratentorial white matter likely reflects sequela of underlying chronic small-vessel ischemic change The pituitary and suprasellar region are normal. There is no mass lesion. There is no mass effect or midline shift. Vascular: There is calcification of the bilateral carotid siphons. Skull: Normal. Negative for fracture or focal lesion. Sinuses/Orbits: The imaged paranasal sinuses are clear. Bilateral lens implants are in place. The globes and orbits are otherwise unremarkable. Other: There is a right mastoid effusion, unchanged since 2019. The imaged nasopharynx is unremarkable. IMPRESSION: No acute intracranial pathology. Electronically Signed   By: Lesia Hausen M.D.   On: 12/08/2022 13:55    EKG None  Radiology CT Head Wo Contrast  Result Date: 12/08/2022 CLINICAL  DATA:  Headache EXAM: CT HEAD WITHOUT CONTRAST TECHNIQUE: Contiguous axial images were obtained from the base of the skull through the vertex without intravenous contrast. RADIATION DOSE REDUCTION: This exam was performed according to the departmental dose-optimization program which includes automated exposure control, adjustment of the mA and/or kV according to patient size and/or use of iterative reconstruction technique. COMPARISON:  CT head 04/25/2018 FINDINGS: Brain: There is no acute intracranial hemorrhage, extra-axial fluid collection, or acute infarct Parenchymal volume is normal. The ventricles are normal in size. Gray-white differentiation is preserved. Patchy hypodensity in the supratentorial white matter likely reflects sequela of underlying chronic small-vessel ischemic change The pituitary and suprasellar region are normal. There is no mass lesion. There is no mass effect or midline shift. Vascular: There is calcification of the bilateral carotid siphons. Skull: Normal. Negative for fracture or focal lesion. Sinuses/Orbits: The imaged paranasal sinuses are clear. Bilateral lens implants are in place. The globes and orbits are otherwise unremarkable. Other: There is a right mastoid effusion, unchanged since 2019. The imaged nasopharynx is unremarkable. IMPRESSION: No acute intracranial pathology. Electronically Signed   By: Lesia Hausen M.D.   On: 12/08/2022 13:55    Procedures Procedures    Medications Ordered in ED Medications  amLODipine (NORVASC) tablet 5 mg (5 mg Oral Given 12/08/22 1624)  irbesartan (AVAPRO) tablet 150 mg (150 mg Oral Given 12/08/22 1623)    And  hydrochlorothiazide (HYDRODIURIL) tablet 12.5 mg (12.5 mg Oral Given 12/08/22 1623)  traMADol (ULTRAM) tablet 50 mg (50 mg Oral Given 12/08/22 1624)  acetaminophen (TYLENOL) tablet 650 mg (650 mg Oral Given 12/08/22 1623)    ED Course/ Medical Decision Making/ A&P                             Medical Decision  Making Problems  Addressed: Chronic anemia: chronic illness or injury Essential hypertension: chronic illness or injury with exacerbation, progression, or side effects of treatment that poses a threat to life or bodily functions Generalized headache: acute illness or injury with systemic symptoms that poses a threat to life or bodily functions Hyperglycemia: acute illness or injury    Details: hx  Amount and/or Complexity of Data Reviewed Independent Historian: EMS External Data Reviewed: notes. Labs: ordered. Decision-making details documented in ED Course. Radiology: ordered and independent interpretation performed. Decision-making details documented in ED Course.  Risk OTC drugs. Prescription drug management. Decision regarding hospitalization.   Iv ns. Continuous pulse ox and cardiac monitoring. Labs ordered/sent. Imaging ordered.   Differential diagnosis includes sah, sdh, migraine, tension headache, etc. Dispo decision including potential need for admission considered - will get labs and imaging and reassess.   Reviewed nursing notes and prior charts for additional history. External reports reviewed. Additional history from: EMS.   Cardiac monitor: sinus rhythm, rate 66.  Acetaminophen po, ultram po.  Pt indicates has not had her bp meds today. Pt given dose of her bp meds.   Labs reviewed/interpreted by me - wbc normal. Hgb c/w prior/baseline. Chem normal except glucose mildly high.  CT reviewed/interpreted by me - no hem.   Po fluids. Food. Ambulate in hall.  Recheck pt comfortable appearing, no distress. Pt appears stable for d/c.   Rec close pcp f/u.  Return precautions provided.          Final Clinical Impression(s) / ED Diagnoses Final diagnoses:  Generalized headache  Essential hypertension  Hyperglycemia  Chronic anemia    Rx / DC Orders ED Discharge Orders     None         Cathren Laine, MD 12/08/22 1709

## 2022-12-08 NOTE — ED Notes (Signed)
Pt given sandwich pack and diet ginger ale

## 2022-12-08 NOTE — ED Triage Notes (Signed)
Per [facility] pt coming from home c/o headache and neck pain x 3 days. Has cyst to back of right shoulder. Recently had MRI. Did not take her meds today due to not feeling right.

## 2022-12-12 DIAGNOSIS — M25511 Pain in right shoulder: Secondary | ICD-10-CM | POA: Diagnosis not present

## 2022-12-18 DIAGNOSIS — M25511 Pain in right shoulder: Secondary | ICD-10-CM | POA: Diagnosis not present

## 2023-01-01 DIAGNOSIS — I129 Hypertensive chronic kidney disease with stage 1 through stage 4 chronic kidney disease, or unspecified chronic kidney disease: Secondary | ICD-10-CM | POA: Diagnosis not present

## 2023-01-01 DIAGNOSIS — M19011 Primary osteoarthritis, right shoulder: Secondary | ICD-10-CM | POA: Diagnosis not present

## 2023-01-01 DIAGNOSIS — E11319 Type 2 diabetes mellitus with unspecified diabetic retinopathy without macular edema: Secondary | ICD-10-CM | POA: Diagnosis not present

## 2023-01-01 DIAGNOSIS — E1122 Type 2 diabetes mellitus with diabetic chronic kidney disease: Secondary | ICD-10-CM | POA: Diagnosis not present

## 2023-01-01 DIAGNOSIS — M7541 Impingement syndrome of right shoulder: Secondary | ICD-10-CM | POA: Diagnosis not present

## 2023-01-01 DIAGNOSIS — I700 Atherosclerosis of aorta: Secondary | ICD-10-CM | POA: Diagnosis not present

## 2023-01-01 DIAGNOSIS — D631 Anemia in chronic kidney disease: Secondary | ICD-10-CM | POA: Diagnosis not present

## 2023-01-01 DIAGNOSIS — E1165 Type 2 diabetes mellitus with hyperglycemia: Secondary | ICD-10-CM | POA: Diagnosis not present

## 2023-01-01 DIAGNOSIS — N1832 Chronic kidney disease, stage 3b: Secondary | ICD-10-CM | POA: Diagnosis not present

## 2023-01-03 DIAGNOSIS — E1122 Type 2 diabetes mellitus with diabetic chronic kidney disease: Secondary | ICD-10-CM | POA: Diagnosis not present

## 2023-01-08 DIAGNOSIS — E11319 Type 2 diabetes mellitus with unspecified diabetic retinopathy without macular edema: Secondary | ICD-10-CM | POA: Diagnosis not present

## 2023-01-08 DIAGNOSIS — M7541 Impingement syndrome of right shoulder: Secondary | ICD-10-CM | POA: Diagnosis not present

## 2023-01-08 DIAGNOSIS — I129 Hypertensive chronic kidney disease with stage 1 through stage 4 chronic kidney disease, or unspecified chronic kidney disease: Secondary | ICD-10-CM | POA: Diagnosis not present

## 2023-01-08 DIAGNOSIS — I700 Atherosclerosis of aorta: Secondary | ICD-10-CM | POA: Diagnosis not present

## 2023-01-08 DIAGNOSIS — E1165 Type 2 diabetes mellitus with hyperglycemia: Secondary | ICD-10-CM | POA: Diagnosis not present

## 2023-01-08 DIAGNOSIS — E1122 Type 2 diabetes mellitus with diabetic chronic kidney disease: Secondary | ICD-10-CM | POA: Diagnosis not present

## 2023-01-08 DIAGNOSIS — D631 Anemia in chronic kidney disease: Secondary | ICD-10-CM | POA: Diagnosis not present

## 2023-01-08 DIAGNOSIS — M19011 Primary osteoarthritis, right shoulder: Secondary | ICD-10-CM | POA: Diagnosis not present

## 2023-01-08 DIAGNOSIS — N1832 Chronic kidney disease, stage 3b: Secondary | ICD-10-CM | POA: Diagnosis not present

## 2023-01-17 DIAGNOSIS — N1832 Chronic kidney disease, stage 3b: Secondary | ICD-10-CM | POA: Diagnosis not present

## 2023-01-17 DIAGNOSIS — I129 Hypertensive chronic kidney disease with stage 1 through stage 4 chronic kidney disease, or unspecified chronic kidney disease: Secondary | ICD-10-CM | POA: Diagnosis not present

## 2023-01-17 DIAGNOSIS — M7541 Impingement syndrome of right shoulder: Secondary | ICD-10-CM | POA: Diagnosis not present

## 2023-01-17 DIAGNOSIS — M19011 Primary osteoarthritis, right shoulder: Secondary | ICD-10-CM | POA: Diagnosis not present

## 2023-01-17 DIAGNOSIS — I700 Atherosclerosis of aorta: Secondary | ICD-10-CM | POA: Diagnosis not present

## 2023-01-17 DIAGNOSIS — E11319 Type 2 diabetes mellitus with unspecified diabetic retinopathy without macular edema: Secondary | ICD-10-CM | POA: Diagnosis not present

## 2023-01-17 DIAGNOSIS — E1165 Type 2 diabetes mellitus with hyperglycemia: Secondary | ICD-10-CM | POA: Diagnosis not present

## 2023-01-17 DIAGNOSIS — D631 Anemia in chronic kidney disease: Secondary | ICD-10-CM | POA: Diagnosis not present

## 2023-01-17 DIAGNOSIS — E1122 Type 2 diabetes mellitus with diabetic chronic kidney disease: Secondary | ICD-10-CM | POA: Diagnosis not present

## 2023-01-21 DIAGNOSIS — I739 Peripheral vascular disease, unspecified: Secondary | ICD-10-CM | POA: Diagnosis not present

## 2023-01-21 DIAGNOSIS — E1151 Type 2 diabetes mellitus with diabetic peripheral angiopathy without gangrene: Secondary | ICD-10-CM | POA: Diagnosis not present

## 2023-01-21 DIAGNOSIS — L603 Nail dystrophy: Secondary | ICD-10-CM | POA: Diagnosis not present

## 2023-01-21 DIAGNOSIS — M21962 Unspecified acquired deformity of left lower leg: Secondary | ICD-10-CM | POA: Diagnosis not present

## 2023-01-23 DIAGNOSIS — M25511 Pain in right shoulder: Secondary | ICD-10-CM | POA: Diagnosis not present

## 2023-01-24 DIAGNOSIS — I700 Atherosclerosis of aorta: Secondary | ICD-10-CM | POA: Diagnosis not present

## 2023-01-24 DIAGNOSIS — E11319 Type 2 diabetes mellitus with unspecified diabetic retinopathy without macular edema: Secondary | ICD-10-CM | POA: Diagnosis not present

## 2023-01-24 DIAGNOSIS — D631 Anemia in chronic kidney disease: Secondary | ICD-10-CM | POA: Diagnosis not present

## 2023-01-24 DIAGNOSIS — E1122 Type 2 diabetes mellitus with diabetic chronic kidney disease: Secondary | ICD-10-CM | POA: Diagnosis not present

## 2023-01-24 DIAGNOSIS — M7541 Impingement syndrome of right shoulder: Secondary | ICD-10-CM | POA: Diagnosis not present

## 2023-01-24 DIAGNOSIS — I129 Hypertensive chronic kidney disease with stage 1 through stage 4 chronic kidney disease, or unspecified chronic kidney disease: Secondary | ICD-10-CM | POA: Diagnosis not present

## 2023-01-24 DIAGNOSIS — N1832 Chronic kidney disease, stage 3b: Secondary | ICD-10-CM | POA: Diagnosis not present

## 2023-01-24 DIAGNOSIS — E1165 Type 2 diabetes mellitus with hyperglycemia: Secondary | ICD-10-CM | POA: Diagnosis not present

## 2023-01-24 DIAGNOSIS — M19011 Primary osteoarthritis, right shoulder: Secondary | ICD-10-CM | POA: Diagnosis not present

## 2023-01-27 DIAGNOSIS — I129 Hypertensive chronic kidney disease with stage 1 through stage 4 chronic kidney disease, or unspecified chronic kidney disease: Secondary | ICD-10-CM | POA: Diagnosis not present

## 2023-01-27 DIAGNOSIS — D631 Anemia in chronic kidney disease: Secondary | ICD-10-CM | POA: Diagnosis not present

## 2023-01-27 DIAGNOSIS — M19011 Primary osteoarthritis, right shoulder: Secondary | ICD-10-CM | POA: Diagnosis not present

## 2023-01-27 DIAGNOSIS — I700 Atherosclerosis of aorta: Secondary | ICD-10-CM | POA: Diagnosis not present

## 2023-01-27 DIAGNOSIS — E1122 Type 2 diabetes mellitus with diabetic chronic kidney disease: Secondary | ICD-10-CM | POA: Diagnosis not present

## 2023-01-27 DIAGNOSIS — M7541 Impingement syndrome of right shoulder: Secondary | ICD-10-CM | POA: Diagnosis not present

## 2023-01-27 DIAGNOSIS — N1832 Chronic kidney disease, stage 3b: Secondary | ICD-10-CM | POA: Diagnosis not present

## 2023-01-27 DIAGNOSIS — E1165 Type 2 diabetes mellitus with hyperglycemia: Secondary | ICD-10-CM | POA: Diagnosis not present

## 2023-01-27 DIAGNOSIS — E11319 Type 2 diabetes mellitus with unspecified diabetic retinopathy without macular edema: Secondary | ICD-10-CM | POA: Diagnosis not present

## 2023-02-07 DIAGNOSIS — E1122 Type 2 diabetes mellitus with diabetic chronic kidney disease: Secondary | ICD-10-CM | POA: Diagnosis not present

## 2023-02-07 DIAGNOSIS — I129 Hypertensive chronic kidney disease with stage 1 through stage 4 chronic kidney disease, or unspecified chronic kidney disease: Secondary | ICD-10-CM | POA: Diagnosis not present

## 2023-02-07 DIAGNOSIS — E1165 Type 2 diabetes mellitus with hyperglycemia: Secondary | ICD-10-CM | POA: Diagnosis not present

## 2023-02-07 DIAGNOSIS — M19011 Primary osteoarthritis, right shoulder: Secondary | ICD-10-CM | POA: Diagnosis not present

## 2023-02-07 DIAGNOSIS — N1832 Chronic kidney disease, stage 3b: Secondary | ICD-10-CM | POA: Diagnosis not present

## 2023-02-07 DIAGNOSIS — M7541 Impingement syndrome of right shoulder: Secondary | ICD-10-CM | POA: Diagnosis not present

## 2023-02-07 DIAGNOSIS — E11319 Type 2 diabetes mellitus with unspecified diabetic retinopathy without macular edema: Secondary | ICD-10-CM | POA: Diagnosis not present

## 2023-02-07 DIAGNOSIS — I700 Atherosclerosis of aorta: Secondary | ICD-10-CM | POA: Diagnosis not present

## 2023-02-07 DIAGNOSIS — D631 Anemia in chronic kidney disease: Secondary | ICD-10-CM | POA: Diagnosis not present

## 2023-02-14 DIAGNOSIS — I129 Hypertensive chronic kidney disease with stage 1 through stage 4 chronic kidney disease, or unspecified chronic kidney disease: Secondary | ICD-10-CM | POA: Diagnosis not present

## 2023-02-14 DIAGNOSIS — N1832 Chronic kidney disease, stage 3b: Secondary | ICD-10-CM | POA: Diagnosis not present

## 2023-02-14 DIAGNOSIS — E1122 Type 2 diabetes mellitus with diabetic chronic kidney disease: Secondary | ICD-10-CM | POA: Diagnosis not present

## 2023-02-14 DIAGNOSIS — M7541 Impingement syndrome of right shoulder: Secondary | ICD-10-CM | POA: Diagnosis not present

## 2023-02-14 DIAGNOSIS — E1165 Type 2 diabetes mellitus with hyperglycemia: Secondary | ICD-10-CM | POA: Diagnosis not present

## 2023-02-14 DIAGNOSIS — M19011 Primary osteoarthritis, right shoulder: Secondary | ICD-10-CM | POA: Diagnosis not present

## 2023-02-14 DIAGNOSIS — I700 Atherosclerosis of aorta: Secondary | ICD-10-CM | POA: Diagnosis not present

## 2023-02-14 DIAGNOSIS — E11319 Type 2 diabetes mellitus with unspecified diabetic retinopathy without macular edema: Secondary | ICD-10-CM | POA: Diagnosis not present

## 2023-02-14 DIAGNOSIS — D631 Anemia in chronic kidney disease: Secondary | ICD-10-CM | POA: Diagnosis not present

## 2023-02-18 DIAGNOSIS — I129 Hypertensive chronic kidney disease with stage 1 through stage 4 chronic kidney disease, or unspecified chronic kidney disease: Secondary | ICD-10-CM | POA: Diagnosis not present

## 2023-02-18 DIAGNOSIS — E1122 Type 2 diabetes mellitus with diabetic chronic kidney disease: Secondary | ICD-10-CM | POA: Diagnosis not present

## 2023-02-18 DIAGNOSIS — N1832 Chronic kidney disease, stage 3b: Secondary | ICD-10-CM | POA: Diagnosis not present

## 2023-02-18 DIAGNOSIS — E1165 Type 2 diabetes mellitus with hyperglycemia: Secondary | ICD-10-CM | POA: Diagnosis not present

## 2023-02-18 DIAGNOSIS — E11319 Type 2 diabetes mellitus with unspecified diabetic retinopathy without macular edema: Secondary | ICD-10-CM | POA: Diagnosis not present

## 2023-02-18 DIAGNOSIS — D631 Anemia in chronic kidney disease: Secondary | ICD-10-CM | POA: Diagnosis not present

## 2023-02-18 DIAGNOSIS — M7541 Impingement syndrome of right shoulder: Secondary | ICD-10-CM | POA: Diagnosis not present

## 2023-02-18 DIAGNOSIS — M19011 Primary osteoarthritis, right shoulder: Secondary | ICD-10-CM | POA: Diagnosis not present

## 2023-02-18 DIAGNOSIS — I700 Atherosclerosis of aorta: Secondary | ICD-10-CM | POA: Diagnosis not present

## 2023-02-28 DIAGNOSIS — M7541 Impingement syndrome of right shoulder: Secondary | ICD-10-CM | POA: Diagnosis not present

## 2023-02-28 DIAGNOSIS — E11319 Type 2 diabetes mellitus with unspecified diabetic retinopathy without macular edema: Secondary | ICD-10-CM | POA: Diagnosis not present

## 2023-02-28 DIAGNOSIS — I700 Atherosclerosis of aorta: Secondary | ICD-10-CM | POA: Diagnosis not present

## 2023-02-28 DIAGNOSIS — N1832 Chronic kidney disease, stage 3b: Secondary | ICD-10-CM | POA: Diagnosis not present

## 2023-02-28 DIAGNOSIS — E1165 Type 2 diabetes mellitus with hyperglycemia: Secondary | ICD-10-CM | POA: Diagnosis not present

## 2023-02-28 DIAGNOSIS — D631 Anemia in chronic kidney disease: Secondary | ICD-10-CM | POA: Diagnosis not present

## 2023-02-28 DIAGNOSIS — E1122 Type 2 diabetes mellitus with diabetic chronic kidney disease: Secondary | ICD-10-CM | POA: Diagnosis not present

## 2023-02-28 DIAGNOSIS — M19011 Primary osteoarthritis, right shoulder: Secondary | ICD-10-CM | POA: Diagnosis not present

## 2023-02-28 DIAGNOSIS — I129 Hypertensive chronic kidney disease with stage 1 through stage 4 chronic kidney disease, or unspecified chronic kidney disease: Secondary | ICD-10-CM | POA: Diagnosis not present

## 2023-03-05 DIAGNOSIS — M25511 Pain in right shoulder: Secondary | ICD-10-CM | POA: Diagnosis not present

## 2023-03-14 DIAGNOSIS — E1142 Type 2 diabetes mellitus with diabetic polyneuropathy: Secondary | ICD-10-CM | POA: Diagnosis not present

## 2023-03-14 DIAGNOSIS — E1122 Type 2 diabetes mellitus with diabetic chronic kidney disease: Secondary | ICD-10-CM | POA: Diagnosis not present

## 2023-03-14 DIAGNOSIS — N1832 Chronic kidney disease, stage 3b: Secondary | ICD-10-CM | POA: Diagnosis not present

## 2023-03-14 DIAGNOSIS — M7541 Impingement syndrome of right shoulder: Secondary | ICD-10-CM | POA: Diagnosis not present

## 2023-03-14 DIAGNOSIS — I129 Hypertensive chronic kidney disease with stage 1 through stage 4 chronic kidney disease, or unspecified chronic kidney disease: Secondary | ICD-10-CM | POA: Diagnosis not present

## 2023-03-14 DIAGNOSIS — I700 Atherosclerosis of aorta: Secondary | ICD-10-CM | POA: Diagnosis not present

## 2023-03-14 DIAGNOSIS — M19011 Primary osteoarthritis, right shoulder: Secondary | ICD-10-CM | POA: Diagnosis not present

## 2023-03-14 DIAGNOSIS — D631 Anemia in chronic kidney disease: Secondary | ICD-10-CM | POA: Diagnosis not present

## 2023-03-14 DIAGNOSIS — E11319 Type 2 diabetes mellitus with unspecified diabetic retinopathy without macular edema: Secondary | ICD-10-CM | POA: Diagnosis not present

## 2023-03-21 DIAGNOSIS — D631 Anemia in chronic kidney disease: Secondary | ICD-10-CM | POA: Diagnosis not present

## 2023-03-21 DIAGNOSIS — E1122 Type 2 diabetes mellitus with diabetic chronic kidney disease: Secondary | ICD-10-CM | POA: Diagnosis not present

## 2023-03-21 DIAGNOSIS — M19011 Primary osteoarthritis, right shoulder: Secondary | ICD-10-CM | POA: Diagnosis not present

## 2023-03-21 DIAGNOSIS — M7541 Impingement syndrome of right shoulder: Secondary | ICD-10-CM | POA: Diagnosis not present

## 2023-03-21 DIAGNOSIS — I700 Atherosclerosis of aorta: Secondary | ICD-10-CM | POA: Diagnosis not present

## 2023-03-21 DIAGNOSIS — N1832 Chronic kidney disease, stage 3b: Secondary | ICD-10-CM | POA: Diagnosis not present

## 2023-03-21 DIAGNOSIS — E11319 Type 2 diabetes mellitus with unspecified diabetic retinopathy without macular edema: Secondary | ICD-10-CM | POA: Diagnosis not present

## 2023-03-21 DIAGNOSIS — M25511 Pain in right shoulder: Secondary | ICD-10-CM | POA: Diagnosis not present

## 2023-03-21 DIAGNOSIS — I129 Hypertensive chronic kidney disease with stage 1 through stage 4 chronic kidney disease, or unspecified chronic kidney disease: Secondary | ICD-10-CM | POA: Diagnosis not present

## 2023-03-21 DIAGNOSIS — E1142 Type 2 diabetes mellitus with diabetic polyneuropathy: Secondary | ICD-10-CM | POA: Diagnosis not present

## 2023-03-28 DIAGNOSIS — M7541 Impingement syndrome of right shoulder: Secondary | ICD-10-CM | POA: Diagnosis not present

## 2023-03-28 DIAGNOSIS — D631 Anemia in chronic kidney disease: Secondary | ICD-10-CM | POA: Diagnosis not present

## 2023-03-28 DIAGNOSIS — E1142 Type 2 diabetes mellitus with diabetic polyneuropathy: Secondary | ICD-10-CM | POA: Diagnosis not present

## 2023-03-28 DIAGNOSIS — E1122 Type 2 diabetes mellitus with diabetic chronic kidney disease: Secondary | ICD-10-CM | POA: Diagnosis not present

## 2023-03-28 DIAGNOSIS — M19011 Primary osteoarthritis, right shoulder: Secondary | ICD-10-CM | POA: Diagnosis not present

## 2023-03-28 DIAGNOSIS — E11319 Type 2 diabetes mellitus with unspecified diabetic retinopathy without macular edema: Secondary | ICD-10-CM | POA: Diagnosis not present

## 2023-03-28 DIAGNOSIS — I700 Atherosclerosis of aorta: Secondary | ICD-10-CM | POA: Diagnosis not present

## 2023-03-28 DIAGNOSIS — N1832 Chronic kidney disease, stage 3b: Secondary | ICD-10-CM | POA: Diagnosis not present

## 2023-03-28 DIAGNOSIS — I129 Hypertensive chronic kidney disease with stage 1 through stage 4 chronic kidney disease, or unspecified chronic kidney disease: Secondary | ICD-10-CM | POA: Diagnosis not present

## 2023-04-01 DIAGNOSIS — M21962 Unspecified acquired deformity of left lower leg: Secondary | ICD-10-CM | POA: Diagnosis not present

## 2023-04-01 DIAGNOSIS — L84 Corns and callosities: Secondary | ICD-10-CM | POA: Diagnosis not present

## 2023-04-01 DIAGNOSIS — N1832 Chronic kidney disease, stage 3b: Secondary | ICD-10-CM | POA: Diagnosis not present

## 2023-04-01 DIAGNOSIS — M7541 Impingement syndrome of right shoulder: Secondary | ICD-10-CM | POA: Diagnosis not present

## 2023-04-01 DIAGNOSIS — D631 Anemia in chronic kidney disease: Secondary | ICD-10-CM | POA: Diagnosis not present

## 2023-04-01 DIAGNOSIS — I739 Peripheral vascular disease, unspecified: Secondary | ICD-10-CM | POA: Diagnosis not present

## 2023-04-01 DIAGNOSIS — E1142 Type 2 diabetes mellitus with diabetic polyneuropathy: Secondary | ICD-10-CM | POA: Diagnosis not present

## 2023-04-01 DIAGNOSIS — I129 Hypertensive chronic kidney disease with stage 1 through stage 4 chronic kidney disease, or unspecified chronic kidney disease: Secondary | ICD-10-CM | POA: Diagnosis not present

## 2023-04-01 DIAGNOSIS — E1151 Type 2 diabetes mellitus with diabetic peripheral angiopathy without gangrene: Secondary | ICD-10-CM | POA: Diagnosis not present

## 2023-04-01 DIAGNOSIS — I700 Atherosclerosis of aorta: Secondary | ICD-10-CM | POA: Diagnosis not present

## 2023-04-01 DIAGNOSIS — L603 Nail dystrophy: Secondary | ICD-10-CM | POA: Diagnosis not present

## 2023-04-01 DIAGNOSIS — E11319 Type 2 diabetes mellitus with unspecified diabetic retinopathy without macular edema: Secondary | ICD-10-CM | POA: Diagnosis not present

## 2023-04-01 DIAGNOSIS — E1122 Type 2 diabetes mellitus with diabetic chronic kidney disease: Secondary | ICD-10-CM | POA: Diagnosis not present

## 2023-04-01 DIAGNOSIS — B351 Tinea unguium: Secondary | ICD-10-CM | POA: Diagnosis not present

## 2023-04-01 DIAGNOSIS — M19011 Primary osteoarthritis, right shoulder: Secondary | ICD-10-CM | POA: Diagnosis not present

## 2023-04-07 DIAGNOSIS — I129 Hypertensive chronic kidney disease with stage 1 through stage 4 chronic kidney disease, or unspecified chronic kidney disease: Secondary | ICD-10-CM | POA: Diagnosis not present

## 2023-04-07 DIAGNOSIS — N1832 Chronic kidney disease, stage 3b: Secondary | ICD-10-CM | POA: Diagnosis not present

## 2023-04-07 DIAGNOSIS — I700 Atherosclerosis of aorta: Secondary | ICD-10-CM | POA: Diagnosis not present

## 2023-04-07 DIAGNOSIS — E1122 Type 2 diabetes mellitus with diabetic chronic kidney disease: Secondary | ICD-10-CM | POA: Diagnosis not present

## 2023-04-07 DIAGNOSIS — E11319 Type 2 diabetes mellitus with unspecified diabetic retinopathy without macular edema: Secondary | ICD-10-CM | POA: Diagnosis not present

## 2023-04-07 DIAGNOSIS — D631 Anemia in chronic kidney disease: Secondary | ICD-10-CM | POA: Diagnosis not present

## 2023-04-07 DIAGNOSIS — M7541 Impingement syndrome of right shoulder: Secondary | ICD-10-CM | POA: Diagnosis not present

## 2023-04-07 DIAGNOSIS — M19011 Primary osteoarthritis, right shoulder: Secondary | ICD-10-CM | POA: Diagnosis not present

## 2023-04-07 DIAGNOSIS — E1142 Type 2 diabetes mellitus with diabetic polyneuropathy: Secondary | ICD-10-CM | POA: Diagnosis not present

## 2023-04-16 DIAGNOSIS — M19011 Primary osteoarthritis, right shoulder: Secondary | ICD-10-CM | POA: Diagnosis not present

## 2023-04-16 DIAGNOSIS — E1122 Type 2 diabetes mellitus with diabetic chronic kidney disease: Secondary | ICD-10-CM | POA: Diagnosis not present

## 2023-04-16 DIAGNOSIS — D631 Anemia in chronic kidney disease: Secondary | ICD-10-CM | POA: Diagnosis not present

## 2023-04-16 DIAGNOSIS — N1832 Chronic kidney disease, stage 3b: Secondary | ICD-10-CM | POA: Diagnosis not present

## 2023-04-16 DIAGNOSIS — I700 Atherosclerosis of aorta: Secondary | ICD-10-CM | POA: Diagnosis not present

## 2023-04-16 DIAGNOSIS — M7541 Impingement syndrome of right shoulder: Secondary | ICD-10-CM | POA: Diagnosis not present

## 2023-04-16 DIAGNOSIS — I129 Hypertensive chronic kidney disease with stage 1 through stage 4 chronic kidney disease, or unspecified chronic kidney disease: Secondary | ICD-10-CM | POA: Diagnosis not present

## 2023-04-16 DIAGNOSIS — E1142 Type 2 diabetes mellitus with diabetic polyneuropathy: Secondary | ICD-10-CM | POA: Diagnosis not present

## 2023-04-16 DIAGNOSIS — E11319 Type 2 diabetes mellitus with unspecified diabetic retinopathy without macular edema: Secondary | ICD-10-CM | POA: Diagnosis not present

## 2023-04-25 DIAGNOSIS — M19011 Primary osteoarthritis, right shoulder: Secondary | ICD-10-CM | POA: Diagnosis not present

## 2023-04-25 DIAGNOSIS — I700 Atherosclerosis of aorta: Secondary | ICD-10-CM | POA: Diagnosis not present

## 2023-04-25 DIAGNOSIS — M7541 Impingement syndrome of right shoulder: Secondary | ICD-10-CM | POA: Diagnosis not present

## 2023-04-25 DIAGNOSIS — E1122 Type 2 diabetes mellitus with diabetic chronic kidney disease: Secondary | ICD-10-CM | POA: Diagnosis not present

## 2023-04-25 DIAGNOSIS — N1832 Chronic kidney disease, stage 3b: Secondary | ICD-10-CM | POA: Diagnosis not present

## 2023-04-25 DIAGNOSIS — E1142 Type 2 diabetes mellitus with diabetic polyneuropathy: Secondary | ICD-10-CM | POA: Diagnosis not present

## 2023-04-25 DIAGNOSIS — E11319 Type 2 diabetes mellitus with unspecified diabetic retinopathy without macular edema: Secondary | ICD-10-CM | POA: Diagnosis not present

## 2023-04-25 DIAGNOSIS — I129 Hypertensive chronic kidney disease with stage 1 through stage 4 chronic kidney disease, or unspecified chronic kidney disease: Secondary | ICD-10-CM | POA: Diagnosis not present

## 2023-04-25 DIAGNOSIS — D631 Anemia in chronic kidney disease: Secondary | ICD-10-CM | POA: Diagnosis not present

## 2023-04-29 DIAGNOSIS — I129 Hypertensive chronic kidney disease with stage 1 through stage 4 chronic kidney disease, or unspecified chronic kidney disease: Secondary | ICD-10-CM | POA: Diagnosis not present

## 2023-04-29 DIAGNOSIS — M7541 Impingement syndrome of right shoulder: Secondary | ICD-10-CM | POA: Diagnosis not present

## 2023-04-29 DIAGNOSIS — N1832 Chronic kidney disease, stage 3b: Secondary | ICD-10-CM | POA: Diagnosis not present

## 2023-04-29 DIAGNOSIS — E1142 Type 2 diabetes mellitus with diabetic polyneuropathy: Secondary | ICD-10-CM | POA: Diagnosis not present

## 2023-04-29 DIAGNOSIS — M19011 Primary osteoarthritis, right shoulder: Secondary | ICD-10-CM | POA: Diagnosis not present

## 2023-04-29 DIAGNOSIS — I700 Atherosclerosis of aorta: Secondary | ICD-10-CM | POA: Diagnosis not present

## 2023-04-29 DIAGNOSIS — E1122 Type 2 diabetes mellitus with diabetic chronic kidney disease: Secondary | ICD-10-CM | POA: Diagnosis not present

## 2023-04-29 DIAGNOSIS — D631 Anemia in chronic kidney disease: Secondary | ICD-10-CM | POA: Diagnosis not present

## 2023-04-29 DIAGNOSIS — E11319 Type 2 diabetes mellitus with unspecified diabetic retinopathy without macular edema: Secondary | ICD-10-CM | POA: Diagnosis not present

## 2023-04-30 ENCOUNTER — Encounter (HOSPITAL_COMMUNITY): Payer: Self-pay

## 2023-04-30 ENCOUNTER — Emergency Department (HOSPITAL_COMMUNITY): Payer: Medicare PPO

## 2023-04-30 ENCOUNTER — Other Ambulatory Visit: Payer: Self-pay

## 2023-04-30 ENCOUNTER — Observation Stay (HOSPITAL_COMMUNITY)
Admission: EM | Admit: 2023-04-30 | Discharge: 2023-05-02 | Disposition: A | Discharge status: Home / Self Care | Payer: Medicare PPO | Attending: Internal Medicine | Admitting: Internal Medicine

## 2023-04-30 DIAGNOSIS — D649 Anemia, unspecified: Principal | ICD-10-CM | POA: Diagnosis present

## 2023-04-30 DIAGNOSIS — R531 Weakness: Secondary | ICD-10-CM | POA: Diagnosis not present

## 2023-04-30 DIAGNOSIS — Z794 Long term (current) use of insulin: Secondary | ICD-10-CM

## 2023-04-30 DIAGNOSIS — D509 Iron deficiency anemia, unspecified: Principal | ICD-10-CM | POA: Insufficient documentation

## 2023-04-30 DIAGNOSIS — Z7985 Long-term (current) use of injectable non-insulin antidiabetic drugs: Secondary | ICD-10-CM | POA: Diagnosis not present

## 2023-04-30 DIAGNOSIS — Z87891 Personal history of nicotine dependence: Secondary | ICD-10-CM | POA: Diagnosis not present

## 2023-04-30 DIAGNOSIS — G8929 Other chronic pain: Secondary | ICD-10-CM | POA: Diagnosis not present

## 2023-04-30 DIAGNOSIS — Z79899 Other long term (current) drug therapy: Secondary | ICD-10-CM | POA: Diagnosis not present

## 2023-04-30 DIAGNOSIS — I10 Essential (primary) hypertension: Secondary | ICD-10-CM | POA: Diagnosis not present

## 2023-04-30 DIAGNOSIS — R0602 Shortness of breath: Secondary | ICD-10-CM | POA: Diagnosis not present

## 2023-04-30 DIAGNOSIS — I129 Hypertensive chronic kidney disease with stage 1 through stage 4 chronic kidney disease, or unspecified chronic kidney disease: Secondary | ICD-10-CM | POA: Diagnosis not present

## 2023-04-30 DIAGNOSIS — E039 Hypothyroidism, unspecified: Secondary | ICD-10-CM | POA: Diagnosis not present

## 2023-04-30 DIAGNOSIS — Z7982 Long term (current) use of aspirin: Secondary | ICD-10-CM | POA: Insufficient documentation

## 2023-04-30 DIAGNOSIS — E1122 Type 2 diabetes mellitus with diabetic chronic kidney disease: Secondary | ICD-10-CM | POA: Insufficient documentation

## 2023-04-30 DIAGNOSIS — R252 Cramp and spasm: Secondary | ICD-10-CM | POA: Diagnosis not present

## 2023-04-30 DIAGNOSIS — E119 Type 2 diabetes mellitus without complications: Secondary | ICD-10-CM

## 2023-04-30 DIAGNOSIS — Z7984 Long term (current) use of oral hypoglycemic drugs: Secondary | ICD-10-CM | POA: Insufficient documentation

## 2023-04-30 DIAGNOSIS — I700 Atherosclerosis of aorta: Secondary | ICD-10-CM | POA: Diagnosis not present

## 2023-04-30 DIAGNOSIS — E7800 Pure hypercholesterolemia, unspecified: Secondary | ICD-10-CM | POA: Diagnosis not present

## 2023-04-30 DIAGNOSIS — N1832 Chronic kidney disease, stage 3b: Secondary | ICD-10-CM | POA: Diagnosis not present

## 2023-04-30 DIAGNOSIS — N1831 Chronic kidney disease, stage 3a: Secondary | ICD-10-CM | POA: Diagnosis not present

## 2023-04-30 DIAGNOSIS — I517 Cardiomegaly: Secondary | ICD-10-CM | POA: Diagnosis not present

## 2023-04-30 LAB — COMPREHENSIVE METABOLIC PANEL
ALT: 10 U/L (ref 0–44)
AST: 14 U/L — ABNORMAL LOW (ref 15–41)
Albumin: 3.4 g/dL — ABNORMAL LOW (ref 3.5–5.0)
Alkaline Phosphatase: 81 U/L (ref 38–126)
Anion gap: 12 (ref 5–15)
BUN: 13 mg/dL (ref 8–23)
CO2: 25 mmol/L (ref 22–32)
Calcium: 8.5 mg/dL — ABNORMAL LOW (ref 8.9–10.3)
Chloride: 103 mmol/L (ref 98–111)
Creatinine, Ser: 1.1 mg/dL — ABNORMAL HIGH (ref 0.44–1.00)
GFR, Estimated: 50 mL/min — ABNORMAL LOW (ref >60)
Glucose, Bld: 168 mg/dL — ABNORMAL HIGH (ref 70–99)
Potassium: 3.1 mmol/L — ABNORMAL LOW (ref 3.5–5.1)
Sodium: 140 mmol/L (ref 135–145)
Total Bilirubin: 0.5 mg/dL (ref 0.3–1.2)
Total Protein: 7.4 g/dL (ref 6.5–8.1)

## 2023-04-30 LAB — CBC
HCT: 25.1 % — ABNORMAL LOW (ref 36.0–46.0)
Hemoglobin: 6.6 g/dL — CL (ref 12.0–15.0)
MCH: 16.6 pg — ABNORMAL LOW (ref 26.0–34.0)
MCHC: 26.3 g/dL — ABNORMAL LOW (ref 30.0–36.0)
MCV: 63.2 fL — ABNORMAL LOW (ref 80.0–100.0)
Platelets: 449 10*3/uL — ABNORMAL HIGH (ref 150–400)
RBC: 3.97 MIL/uL (ref 3.87–5.11)
RDW: 24.4 % — ABNORMAL HIGH (ref 11.5–15.5)
WBC: 6.8 10*3/uL (ref 4.0–10.5)
nRBC: 0.3 % — ABNORMAL HIGH (ref 0.0–0.2)

## 2023-04-30 LAB — PREPARE RBC (CROSSMATCH)

## 2023-04-30 LAB — PROTIME-INR
INR: 1 (ref 0.8–1.2)
Prothrombin Time: 13.3 s (ref 11.4–15.2)

## 2023-04-30 LAB — POC OCCULT BLOOD, ED: Fecal Occult Bld: NEGATIVE

## 2023-04-30 MED ORDER — LEVOTHYROXINE SODIUM 25 MCG PO TABS
125.0000 ug | ORAL_TABLET | Freq: Every day | ORAL | Status: DC
  Administered 2023-05-02: 125 ug via ORAL
  Filled 2023-04-30: qty 1

## 2023-04-30 MED ORDER — POTASSIUM CHLORIDE CRYS ER 20 MEQ PO TBCR
40.0000 meq | EXTENDED_RELEASE_TABLET | Freq: Once | ORAL | Status: AC
  Administered 2023-04-30: 40 meq via ORAL
  Filled 2023-04-30: qty 2

## 2023-04-30 MED ORDER — INSULIN GLARGINE-YFGN 100 UNIT/ML ~~LOC~~ SOLN
20.0000 [IU] | Freq: Every day | SUBCUTANEOUS | Status: DC
  Administered 2023-05-02: 20 [IU] via SUBCUTANEOUS
  Filled 2023-04-30 (×2): qty 0.2

## 2023-04-30 MED ORDER — IRBESARTAN 150 MG PO TABS
150.0000 mg | ORAL_TABLET | Freq: Every day | ORAL | Status: DC
  Administered 2023-05-02: 150 mg via ORAL
  Filled 2023-04-30: qty 1

## 2023-04-30 MED ORDER — SODIUM CHLORIDE 0.9% IV SOLUTION: Freq: Once | INTRAVENOUS | Status: AC

## 2023-04-30 MED ORDER — ACETAMINOPHEN 325 MG PO TABS
650.0000 mg | ORAL_TABLET | Freq: Four times a day (QID) | ORAL | Status: DC | PRN
  Administered 2023-05-02: 650 mg via ORAL
  Filled 2023-04-30: qty 2

## 2023-04-30 MED ORDER — ATORVASTATIN CALCIUM 10 MG PO TABS
10.0000 mg | ORAL_TABLET | Freq: Every day | ORAL | Status: DC
  Administered 2023-05-01 – 2023-05-02 (×2): 10 mg via ORAL
  Filled 2023-04-30 (×2): qty 1

## 2023-04-30 MED ORDER — VALSARTAN-HYDROCHLOROTHIAZIDE 160-12.5 MG PO TABS: 1.0000 | ORAL_TABLET | Freq: Every day | ORAL | Status: DC

## 2023-04-30 MED ORDER — HYDROCHLOROTHIAZIDE 12.5 MG PO TABS
12.5000 mg | ORAL_TABLET | Freq: Every day | ORAL | Status: DC
  Administered 2023-05-02: 12.5 mg via ORAL
  Filled 2023-04-30: qty 1

## 2023-04-30 MED ORDER — INSULIN ASPART 100 UNIT/ML IJ SOLN
0.0000 [IU] | INTRAMUSCULAR | Status: DC
Start: 2023-05-01 — End: 2023-05-02
  Administered 2023-05-02 (×4): 1 [IU] via SUBCUTANEOUS

## 2023-04-30 MED ORDER — SENNOSIDES-DOCUSATE SODIUM 8.6-50 MG PO TABS: 1.0000 | ORAL_TABLET | Freq: Every evening | ORAL | Status: DC | PRN

## 2023-04-30 MED ORDER — PANTOPRAZOLE SODIUM 40 MG IV SOLR
40.0000 mg | Freq: Once | INTRAVENOUS | Status: AC
  Administered 2023-05-01: 40 mg via INTRAVENOUS
  Filled 2023-04-30: qty 10

## 2023-04-30 MED ORDER — ACETAMINOPHEN 650 MG RE SUPP: 650.0000 mg | Freq: Four times a day (QID) | RECTAL | Status: DC | PRN

## 2023-04-30 MED ORDER — AMLODIPINE BESYLATE 5 MG PO TABS
5.0000 mg | ORAL_TABLET | Freq: Every day | ORAL | Status: DC
  Administered 2023-05-01 – 2023-05-02 (×2): 5 mg via ORAL
  Filled 2023-04-30 (×2): qty 1

## 2023-04-30 NOTE — ED Provider Triage Note (Signed)
Emergency Medicine Provider Triage Evaluation Note  SUZETTE LAFLECHE , a 82 y.o. female  was evaluated in triage.  Pt complains of sent by PCP.  Review of Systems  Positive: Fatigue, generalized weakness Negative: Syncope, chest pain, vomiting  Physical Exam  BP (!) 127/115 (BP Location: Right Arm)   Pulse 88   Temp 97.9 F (36.6 C) (Oral)   Resp 19   SpO2 100%  Gen:   Awake, no distress   Resp:  Normal effort  MSK:   Moves extremities without difficulty  Other:  Pale conjunctiva  Medical Decision Making  Medically screening exam initiated at 4:58 PM.  Appropriate orders placed.  Wilfrid Lund was informed that the remainder of the evaluation will be completed by another provider, this initial triage assessment does not replace that evaluation, and the importance of remaining in the ED until their evaluation is complete.  Patient with history of blood transfusion x1 sent from doctor's office stating she needs a transfusion. The patient is not sure why she needed one in the past but reports she feels the same now as then.   Hemodynamically stable.    Elpidio Anis, PA-C 04/30/23 1700

## 2023-04-30 NOTE — ED Triage Notes (Signed)
Pt is coming in at the request of her pcp, sates she saw her doctor for generally feeling unwell in which they took blood from her and called her today to come in to get " iron or a blood transfusion". Unable to see if this is true in the the chart review of the patient but she mentions she has received blood transfusions in the past. She endorses some dizziness, shortness of breath and general weakness as symptoms she has been having.

## 2023-04-30 NOTE — ED Provider Notes (Signed)
[place] EMERGENCY DEPARTMENT AT [facility] Provider Note   CSN: [id number] Arrival date & time: 04/30/23  1604     History  Chief Complaint  Patient presents with   Medical Problem    Diane Knight is a 82 y.o. female.  82 year old female with past medical history of hypertension and anemia presenting to the emergency department today with concern for symptomatic anemia.  The patient states that over the past few weeks that she has been having some lightheadedness.  She states that she is feeling some generalized weakness as well as muscle cramps.  She with your primary care provider today and her hemoglobin was low.  She was sent to the ER at that time for further evaluation.  The patient denies any chest pain or shortness of breath.  She states that she is having some dark stools but states that this is not uncommon for her.  She states that after her most recent hospitalization where she did receive a transfusion she has had a colonoscopy and endoscopy.  Looking back through her providers notes it does appear that she had some gastritis but no other acute findings.  The patient was on iron supplementation but did not know if she was supposed to continue this so she stopped this after her colonoscopy and endoscopy.  She came to the emergency department today for further evaluation regarding this.  She denies any frank blood in her stool.        Home Medications Prior to Admission medications   Medication Sig Start Date End Date Taking? Authorizing Provider  acetaminophen (TYLENOL) 500 MG tablet Take 500 mg by mouth every 6 (six) hours as needed for mild pain or headache.    Yes [provider]  amLODipine (NORVASC) 5 MG tablet Take 5 mg by mouth daily. 04/13/22  Yes [provider]  aspirin EC 81 MG tablet Take 81 mg by mouth daily. Swallow whole.   Yes [provider]  atorvastatin (LIPITOR) 10 MG tablet Take 10 mg by mouth daily.   Yes  [provider]  HUMALOG MIX 75/25 KWIKPEN (75-25) 100 UNIT/ML KwikPen Inject 50-55 Units into the skin See admin instructions. Inject 55 units subcutaneous in the morning and then inject 50 units subcutaneous in the evening 12/03/21  Yes [provider]  levothyroxine (SYNTHROID) 125 MCG tablet Take 125 mcg by mouth daily before breakfast.   Yes [provider]  metFORMIN (GLUCOPHAGE-XR) 500 MG 24 hr tablet Take 500 mg by mouth every evening. 04/29/22  Yes [provider]  TRULICITY 1.5 MG/0.5ML SOPN Inject 1.5 mg into the skin once a week. 04/13/22  Yes [provider]  valsartan-hydrochlorothiazide (DIOVAN-HCT) 160-12.5 MG per tablet Take 1 tablet by mouth daily.   Yes [provider]  calcium carbonate (TUMS) 500 MG chewable tablet Chew 3 tablets (600 mg of elemental calcium total) by mouth 4 (four) times daily -  before meals and at bedtime. Patient not taking: Reported on 04/30/2023 08/06/17   Darnell Level, MD  Ferrous Sulfate (IRON) 325 (65 Fe) MG TABS Take 1 tablet (325 mg total) by mouth daily. Patient not taking: Reported on 04/30/2023 07/16/22   Marlin Canary U, DO  lidocaine (HM LIDOCAINE PATCH) 4 % Place 1 patch onto the skin daily. Patient not taking: Reported on 04/30/2023 07/12/22   Raspet, Noberto Retort, PA-C      Allergies    Metformin and related    Review of Systems   Review  of Systems  Musculoskeletal:  Positive for myalgias.  Neurological:  Positive for light-headedness.  All other systems reviewed and are negative.   Physical Exam Updated Vital Signs BP (!) 143/66   Pulse 67   Temp 98.4 F (36.9 C) (Oral)   Resp (!) 22   SpO2 98%  Physical Exam Vitals and nursing note reviewed.   Gen: NAD Eyes: PERRL, EOMI HEENT: no oropharyngeal swelling Neck: trachea midline Resp: clear to auscultation bilaterally Card: RRR, no murmurs, rubs, or gallops Abd: nontender, nondistended Extremities: no calf tenderness, no  edema Vascular: 2+ radial pulses bilaterally, 2+ DP pulses bilaterally Skin: no rashes Psyc: acting appropriately   ED Results / Procedures / Treatments   Labs (all labs ordered are listed, but only abnormal results are displayed) Labs Reviewed  COMPREHENSIVE METABOLIC PANEL - Abnormal; Notable for the following components:      Result Value   Potassium 3.1 (*)    Glucose, Bld 168 (*)    Creatinine, Ser 1.10 (*)    Calcium 8.5 (*)    Albumin 3.4 (*)    AST 14 (*)    GFR, Estimated 50 (*)    All other components within normal limits  CBC - Abnormal; Notable for the following components:   Hemoglobin 6.6 (*)    HCT 25.1 (*)    MCV 63.2 (*)    MCH 16.6 (*)    MCHC 26.3 (*)    RDW 24.4 (*)    Platelets 449 (*)    nRBC 0.3 (*)    All other components within normal limits  PROTIME-INR  POC OCCULT BLOOD, ED  TYPE AND SCREEN  PREPARE RBC (CROSSMATCH)    EKG None  Radiology DG Chest 1 View  Result Date: 04/30/2023 CLINICAL DATA:  Shortness of breath. Generalized weakness for 3 days. EXAM: CHEST  1 VIEW COMPARISON:  10/23/2021 FINDINGS: Mild cardiac enlargement. No vascular congestion, edema, or consolidation. No pleural effusions. No pneumothorax. Mediastinal contours appear intact. Calcification of the aorta. Degenerative changes in the spine and shoulders. Surgical clips in the base of the neck. IMPRESSION: Mild cardiac enlargement.  No evidence of active pulmonary disease. Electronically Signed   By: Burman Nieves M.D.   On: 04/30/2023 19:06    Procedures Procedures    Medications Ordered in ED Medications  pantoprazole (PROTONIX) injection 40 mg (has no administration in time range)  0.9 %  sodium chloride infusion (Manually program via Guardrails IV Fluids) ( Intravenous New Bag/Given 04/30/23 2020)  potassium chloride SA (KLOR-CON M) CR tablet 40 mEq (40 mEq Oral Given 04/30/23 1905)    ED Course/ Medical Decision Making/ A&P                                  Medical Decision Making 82 year old female with past medical history of anemia and hypertension presents the emergency department today with symptomatic anemia.  The patient was found to be anemic.  I will repeat labs here and obtain type and screen in the event the patient does require transfusion.  Will Hemoccult her stool.  States that the dark tarry stools been going on even before she had the colonoscopy which I suppose could be from her gastritis.    The patient's hemoglobin is low here.  She is ordered a transfusion.  I called and discussed her case with Dr. Leonides Schanz who is on-call for GI.  She recommends given the patient twice  daily PPI.  This was started here in the ER.  She did go through her records and it appears that she is in [facility].  I did send a secure message to the [facility] GI docs on-call today.  She will be kept n.p.o. in the event that they would like to repeat EGD tomorrow.  Dr. Leonides Schanz did feel that admission would be the safest for this elderly patient with symptomatic anemia to ensure that her hemoglobin is stable.   Amount and/or Complexity of Data Reviewed Labs: ordered.  Risk Prescription drug management. Decision regarding hospitalization.           Final Clinical Impression(s) / ED Diagnoses Final diagnoses:  Symptomatic anemia    Rx / DC Orders ED Discharge Orders     None         Durwin Glaze, MD 04/30/23 2227

## 2023-04-30 NOTE — H&P (Signed)
History and Physical    Diane Knight GMW:[id number] DOB: [birth year] DOA: 04/30/2023  PCP: Renford Dills, MD   Patient coming from: Home   Chief Complaint: Lightheaded, general weakness, DOE, low Hgb   HPI: Diane Knight is a 82 y.o. female with medical history significant for peripheral arterial disease, type 2 diabetes mellitus, hypertension, hyperlipidemia, CKD 3A, and iron deficiency anemia who presents with generalized weakness, lightheadedness, exertional dyspnea, and low hemoglobin on outpatient blood work.  Patient reports insidious development of lightheadedness, generalized weakness, and exertional dyspnea.  She reports that her stool is dark but unchanged.  She has not seen any blood in her stool.  She has had intermittent abdominal discomfort and had 1 episode of vomiting several days ago but denies hematemesis.     ED Course: Upon arrival to the ED, patient is found to be afebrile and saturating well on room air with normal heart rate and stable blood pressure.  EKG demonstrates sinus rhythm and chest x-ray is negative for acute findings.  Labs are most notable for hemoglobin 6.6, MCV 63.2, platelets 449,000, potassium 3.1, and creatinine 1.10.  GI was consulted by the ED physician and the patient was treated with IV Protonix, 40 mEq oral potassium, and 1 unit RBC transfusion.  Review of Systems:  All other systems reviewed and apart from HPI, are negative.  Past Medical History:  Diagnosis Date   Cataract    Diabetes mellitus    TYPE 2    Fibromyalgia    Goiter    Hyperlipidemia    Hypertension    Neuropathy    Tobacco dependence     Past Surgical History:  Procedure Laterality Date   ABDOMINAL HYSTERECTOMY     EYE SURGERY     THYROIDECTOMY N/A 08/04/2017   Procedure: TOTAL THYROIDECTOMY;  Surgeon: Darnell Level, MD;  Location: [facility];  Service: General;  Laterality: N/A;    Social History:   reports that she has quit smoking. Her smoking use included  cigarettes. She has never used smokeless tobacco. She reports that she does not drink alcohol and does not use drugs.  Allergies  Allergen Reactions   Metformin And Related Other (See Comments)    GI SIDE EFFECTS    Family History  Problem Relation Age of Onset   Diabetes Mother    High blood pressure Father      Prior to Admission medications   Medication Sig Start Date End Date Taking? Authorizing Provider  acetaminophen (TYLENOL) 500 MG tablet Take 500 mg by mouth every 6 (six) hours as needed for mild pain or headache.    Yes [provider]  amLODipine (NORVASC) 5 MG tablet Take 5 mg by mouth daily. 04/13/22  Yes [provider]  aspirin EC 81 MG tablet Take 81 mg by mouth daily. Swallow whole.   Yes [provider]  atorvastatin (LIPITOR) 10 MG tablet Take 10 mg by mouth daily.   Yes [provider]  HUMALOG MIX 75/25 KWIKPEN (75-25) 100 UNIT/ML KwikPen Inject 50-55 Units into the skin See admin instructions. Inject 55 units subcutaneous in the morning and then inject 50 units subcutaneous in the evening 12/03/21  Yes [provider]  levothyroxine (SYNTHROID) 125 MCG tablet Take 125 mcg by mouth daily before breakfast.   Yes [provider]  metFORMIN (GLUCOPHAGE-XR) 500 MG 24 hr tablet Take 500 mg by mouth every evening. 04/29/22  Yes [provider]  TRULICITY 1.5 MG/0.5ML SOPN Inject 1.5 mg into  the skin once a week. 04/13/22  Yes [provider]  valsartan-hydrochlorothiazide (DIOVAN-HCT) 160-12.5 MG per tablet Take 1 tablet by mouth daily.   Yes [provider]  calcium carbonate (TUMS) 500 MG chewable tablet Chew 3 tablets (600 mg of elemental calcium total) by mouth 4 (four) times daily -  before meals and at bedtime. Patient not taking: Reported on 04/30/2023 08/06/17   Darnell Level, MD  Ferrous Sulfate (IRON) 325 (65 Fe) MG TABS Take 1 tablet (325 mg total) by mouth daily. Patient not taking:  Reported on 04/30/2023 07/16/22   Marlin Canary U, DO  lidocaine (HM LIDOCAINE PATCH) 4 % Place 1 patch onto the skin daily. Patient not taking: Reported on 04/30/2023 07/12/22   Jeani Hawking, PA-C    Physical Exam: Vitals:   04/30/23 2038 04/30/23 2100 04/30/23 2106 04/30/23 2115  BP: (!) 140/63 (!) 150/52  (!) 143/66  Pulse: 69 71  67  Resp: (!) 22 (!) 23  (!) 22  Temp: 98.6 F (37 C)  98.4 F (36.9 C)   TempSrc: Oral  Oral   SpO2: 99% 99%  98%    Constitutional: NAD, no pallor or diaphoresis   Eyes: PERTLA, lids and conjunctivae normal ENMT: Mucous membranes are moist. Posterior pharynx clear of any exudate or lesions.   Neck: supple, no masses  Respiratory: no wheezing, no crackles. No accessory muscle use.  Cardiovascular: S1 & S2 heard, regular rate and rhythm. No JVD. Abdomen: No distension, no tenderness, soft. Bowel sounds active.  Musculoskeletal: no clubbing / cyanosis. No joint deformity upper and lower extremities.   Skin: no significant rashes, lesions, ulcers. Warm, dry, well-perfused. Neurologic: CN 2-12 grossly intact. Moving all extremities. Alert and oriented.  Psychiatric: Calm. Cooperative.    Labs and Imaging on Admission: I have personally reviewed following labs and imaging studies  CBC: Recent Labs  Lab 04/30/23 1620  WBC 6.8  HGB 6.6*  HCT 25.1*  MCV 63.2*  PLT 449*   Basic Metabolic Panel: Recent Labs  Lab 04/30/23 1620  NA 140  K 3.1*  CL 103  CO2 25  GLUCOSE 168*  BUN 13  CREATININE 1.10*  CALCIUM 8.5*   GFR: CrCl cannot be calculated (Unknown ideal weight.). Liver Function Tests: Recent Labs  Lab 04/30/23 1620  AST 14*  ALT 10  ALKPHOS 81  BILITOT 0.5  PROT 7.4  ALBUMIN 3.4*   No results for input(s): "LIPASE", "AMYLASE" in the last 168 hours. No results for input(s): "AMMONIA" in the last 168 hours. Coagulation Profile: Recent Labs  Lab 04/30/23 1753  INR 1.0   Cardiac Enzymes: No results for input(s):  "CKTOTAL", "CKMB", "CKMBINDEX", "TROPONINI" in the last 168 hours. BNP (last 3 results) No results for input(s): "PROBNP" in the last 8760 hours. HbA1C: No results for input(s): "HGBA1C" in the last 72 hours. CBG: No results for input(s): "GLUCAP" in the last 168 hours. Lipid Profile: No results for input(s): "CHOL", "HDL", "LDLCALC", "TRIG", "CHOLHDL", "LDLDIRECT" in the last 72 hours. Thyroid Function Tests: No results for input(s): "TSH", "T4TOTAL", "FREET4", "T3FREE", "THYROIDAB" in the last 72 hours. Anemia Panel: No results for input(s): "VITAMINB12", "FOLATE", "FERRITIN", "TIBC", "IRON", "RETICCTPCT" in the last 72 hours. Urine analysis:    Component Value Date/Time   COLORURINE YELLOW 04/25/2018 0122   APPEARANCEUR CLEAR 04/25/2018 0122   LABSPEC 1.015 07/12/2022 1510   PHURINE 5.5 07/12/2022 1510   GLUCOSEU NEGATIVE 07/12/2022 1510   HGBUR NEGATIVE 07/12/2022 1510   BILIRUBINUR NEGATIVE  07/12/2022 1510   KETONESUR NEGATIVE 07/12/2022 1510   PROTEINUR NEGATIVE 07/12/2022 1510   UROBILINOGEN 0.2 07/12/2022 1510   NITRITE NEGATIVE 07/12/2022 1510   LEUKOCYTESUR NEGATIVE 07/12/2022 1510   Sepsis Labs: @LABRCNTIP (procalcitonin:4,lacticidven:4) )No results found for this or any previous visit (from the past 240 hour(s)).   Radiological Exams on Admission: DG Chest 1 View  Result Date: 04/30/2023 CLINICAL DATA:  Shortness of breath. Generalized weakness for 3 days. EXAM: CHEST  1 VIEW COMPARISON:  10/23/2021 FINDINGS: Mild cardiac enlargement. No vascular congestion, edema, or consolidation. No pleural effusions. No pneumothorax. Mediastinal contours appear intact. Calcification of the aorta. Degenerative changes in the spine and shoulders. Surgical clips in the base of the neck. IMPRESSION: Mild cardiac enlargement.  No evidence of active pulmonary disease. Electronically Signed   By: Burman Nieves M.D.   On: 04/30/2023 19:06    EKG: Independently reviewed. Sinus rhythm,  RBBB.   Assessment/Plan   1. Symptomatic anemia  - Hgb 6.6 (9.6 in June 2024), HR and BP normal, FOBT negative  - GI recommended medical admission and treatment with PPI  - 1 unit RBC transfusing in ED and IV PPI was started   - Continue IV PPI, follow serial CBCs, follow-up on GI recommendations    2. Hypertension  - Continue Norvasc, valsartan-HCTZ  3. Insulin-dependent DM  - A1c was 6.7% in January 2024  - Check CBGs and treat with insulin   4. CKD 3A  - Appears close to baseline  - Renally-dose medications, monitor    DVT prophylaxis: SCDs Code Status: Full   Level of Care: Level of care: Med-Surg Family Communication: None present   Disposition Plan:  Patient is from: Home  Anticipated d/c is to: Home Anticipated d/c date is: Possibly as early as 05/01/23  Patient currently: Pending stable H&H, GI consultation  Consults called: GI  Admission status: Observation     Briscoe Deutscher, MD [facility]  04/30/2023, 10:46 PM

## 2023-05-01 ENCOUNTER — Encounter (HOSPITAL_COMMUNITY): Admission: EM | Disposition: A | Discharge status: Home / Self Care | Payer: Self-pay | Source: Home / Self Care

## 2023-05-01 ENCOUNTER — Encounter (HOSPITAL_COMMUNITY): Payer: Self-pay | Admitting: Family Medicine

## 2023-05-01 DIAGNOSIS — D500 Iron deficiency anemia secondary to blood loss (chronic): Secondary | ICD-10-CM | POA: Diagnosis not present

## 2023-05-01 DIAGNOSIS — D649 Anemia, unspecified: Secondary | ICD-10-CM | POA: Diagnosis not present

## 2023-05-01 HISTORY — PX: GIVENS CAPSULE STUDY: SHX5432

## 2023-05-01 LAB — HEMOGLOBIN A1C
Hgb A1c MFr Bld: 6.5 % — ABNORMAL HIGH (ref 4.8–5.6)
Mean Plasma Glucose: 139.85 mg/dL

## 2023-05-01 LAB — CBC
HCT: 26.3 % — ABNORMAL LOW (ref 36.0–46.0)
HCT: 27.4 % — ABNORMAL LOW (ref 36.0–46.0)
HCT: 27.8 % — ABNORMAL LOW (ref 36.0–46.0)
Hemoglobin: 7.5 g/dL — ABNORMAL LOW (ref 12.0–15.0)
Hemoglobin: 7.8 g/dL — ABNORMAL LOW (ref 12.0–15.0)
Hemoglobin: 7.9 g/dL — ABNORMAL LOW (ref 12.0–15.0)
MCH: 18.3 pg — ABNORMAL LOW (ref 26.0–34.0)
MCH: 18.5 pg — ABNORMAL LOW (ref 26.0–34.0)
MCH: 18.9 pg — ABNORMAL LOW (ref 26.0–34.0)
MCHC: 28.1 g/dL — ABNORMAL LOW (ref 30.0–36.0)
MCHC: 28.5 g/dL — ABNORMAL LOW (ref 30.0–36.0)
MCHC: 28.8 g/dL — ABNORMAL LOW (ref 30.0–36.0)
MCV: 64.8 fL — ABNORMAL LOW (ref 80.0–100.0)
MCV: 65.3 fL — ABNORMAL LOW (ref 80.0–100.0)
MCV: 65.7 fL — ABNORMAL LOW (ref 80.0–100.0)
Platelets: 395 10*3/uL (ref 150–400)
Platelets: 399 10*3/uL (ref 150–400)
Platelets: 420 10*3/uL — ABNORMAL HIGH (ref 150–400)
RBC: 4.06 MIL/uL (ref 3.87–5.11)
RBC: 4.17 MIL/uL (ref 3.87–5.11)
RBC: 4.26 MIL/uL (ref 3.87–5.11)
RDW: 26.2 % — ABNORMAL HIGH (ref 11.5–15.5)
RDW: 26.3 % — ABNORMAL HIGH (ref 11.5–15.5)
RDW: 26.5 % — ABNORMAL HIGH (ref 11.5–15.5)
WBC: 6.3 10*3/uL (ref 4.0–10.5)
WBC: 6.5 10*3/uL (ref 4.0–10.5)
WBC: 7.4 10*3/uL (ref 4.0–10.5)
nRBC: 0 % (ref 0.0–0.2)
nRBC: 0 % (ref 0.0–0.2)
nRBC: 0 % (ref 0.0–0.2)

## 2023-05-01 LAB — BPAM RBC
Blood Product Expiration Date: 202411202359
ISSUE DATE / TIME: 202410302006
Unit Type and Rh: 5100

## 2023-05-01 LAB — TYPE AND SCREEN
ABO/RH(D): O POS
Antibody Screen: NEGATIVE
Unit division: 0

## 2023-05-01 LAB — BASIC METABOLIC PANEL
Anion gap: 9 (ref 5–15)
BUN: 9 mg/dL (ref 8–23)
CO2: 26 mmol/L (ref 22–32)
Calcium: 8.5 mg/dL — ABNORMAL LOW (ref 8.9–10.3)
Chloride: 106 mmol/L (ref 98–111)
Creatinine, Ser: 0.89 mg/dL (ref 0.44–1.00)
GFR, Estimated: >60 mL/min (ref >60)
Glucose, Bld: 137 mg/dL — ABNORMAL HIGH (ref 70–99)
Potassium: 3.7 mmol/L (ref 3.5–5.1)
Sodium: 141 mmol/L (ref 135–145)

## 2023-05-01 LAB — GLUCOSE, CAPILLARY
Glucose-Capillary: 89 mg/dL (ref 70–99)
Glucose-Capillary: 113 mg/dL — ABNORMAL HIGH (ref 70–99)
Glucose-Capillary: 126 mg/dL — ABNORMAL HIGH (ref 70–99)
Glucose-Capillary: 114 mg/dL — ABNORMAL HIGH (ref 70–99)
Glucose-Capillary: 122 mg/dL — ABNORMAL HIGH (ref 70–99)
Glucose-Capillary: 204 mg/dL — ABNORMAL HIGH (ref 70–99)
Glucose-Capillary: 168 mg/dL — ABNORMAL HIGH (ref 70–99)

## 2023-05-01 SURGERY — IMAGING PROCEDURE, GI TRACT, INTRALUMINAL, VIA CAPSULE

## 2023-05-01 MED ORDER — FERROUS SULFATE 325 (65 FE) MG PO TABS
325.0000 mg | ORAL_TABLET | Freq: Every day | ORAL | Status: DC
  Administered 2023-05-02: 325 mg via ORAL
  Filled 2023-05-01: qty 1

## 2023-05-01 MED ORDER — SODIUM CHLORIDE 0.9 % IV SOLN: INTRAVENOUS | Status: DC

## 2023-05-01 NOTE — Plan of Care (Signed)

## 2023-05-01 NOTE — Consult Note (Signed)
[facility] Consult  Referring Provider: [facility] hospital[facility]/Dr. Opyd Primary Care Physician:  Renford Dills, MD Primary Gastroenterologist: Dr. Arm[facility]  Reason for Consultation: Symptomatic anemia  HPI: Diane Knight is a 82 y.o. female came to the ER with complains of worsening fatigue, dizziness and generalized weakness for the last 2 weeks. Patient states that she has also noticed that stools have been unusually dark/black described as formed stools, once a day for the last 1 week. Patient takes aspirin 81 mg at home but denies use of NSAIDs, other antiplatelets or blood thinners. She complains of generalized cramping abdominal pain(she states her sister had endometriosis and is worried about that). Last week she had 1 episode of vomiting described as nonbloody. She denies unintentional weight loss or loss of appetite. She denies acid reflux or heartburn. She denies difficulty swallowing or pain on swallowing. She complains of bloating, increased abdominal noise/borborygmi.  Recent GI workup for iron deficiency anemia: EGD 09/06/2022, Dr. Lorenso Quarry, epigastric pain, iron deficiency anemia: Erosions and gastritis Colonoscopy 09/06/2022, Dr. Lorenso Quarry, iron deficiency anemia, first colonoscopy: 1 tubular adenoma was removed from descending, patient noted to have internal and external hemorrhoids, repeat colonoscopy not recommended due to age. Labs from 04/30/2023 performed as an outpatient showed hemoglobin of 7, MCV 59.4, platelet 429. Patient states she was given iron supplementation however when she finished her bottle of iron pill she has not resumed taking iron. She has history of iron deficiency anemia at least for over 2 years.   Past Medical History:  Diagnosis Date   Cataract    Diabetes mellitus    TYPE 2    Fibromyalgia    Goiter    Hyperlipidemia    Hypertension    Neuropathy    Tobacco dependence     Past Surgical History:  Procedure Laterality  Date   ABDOMINAL HYSTERECTOMY     EYE SURGERY     THYROIDECTOMY N/A 08/04/2017   Procedure: TOTAL THYROIDECTOMY;  Surgeon: Darnell Level, MD;  Location: [facility];  Service: General;  Laterality: N/A;    Prior to Admission medications   Medication Sig Start Date End Date Taking? Authorizing Provider  acetaminophen (TYLENOL) 500 MG tablet Take 500 mg by mouth every 6 (six) hours as needed for mild pain or headache.    Yes [provider]  amLODipine (NORVASC) 5 MG tablet Take 5 mg by mouth daily. 04/13/22  Yes [provider]  aspirin EC 81 MG tablet Take 81 mg by mouth daily. Swallow whole.   Yes [provider]  atorvastatin (LIPITOR) 10 MG tablet Take 10 mg by mouth daily.   Yes [provider]  HUMALOG MIX 75/25 KWIKPEN (75-25) 100 UNIT/ML KwikPen Inject 50-55 Units into the skin See admin instructions. Inject 55 units subcutaneous in the morning and then inject 50 units subcutaneous in the evening 12/03/21  Yes [provider]  levothyroxine (SYNTHROID) 125 MCG tablet Take 125 mcg by mouth daily before breakfast.   Yes [provider]  metFORMIN (GLUCOPHAGE-XR) 500 MG 24 hr tablet Take 500 mg by mouth every evening. 04/29/22  Yes [provider]  TRULICITY 1.5 MG/0.5ML SOPN Inject 1.5 mg into the skin once a week. 04/13/22  Yes [provider]  valsartan-hydrochlorothiazide (DIOVAN-HCT) 160-12.5 MG per tablet Take 1 tablet by mouth daily.   Yes [provider]  calcium carbonate (TUMS) 500 MG chewable tablet Chew 3 tablets (600 mg of elemental calcium total) by mouth 4 (four) times daily -  before  meals and at bedtime. Patient not taking: Reported on 04/30/2023 08/06/17   Darnell Level, MD  Ferrous Sulfate (IRON) 325 (65 Fe) MG TABS Take 1 tablet (325 mg total) by mouth daily. Patient not taking: Reported on 04/30/2023 07/16/22   Marlin Canary U, DO  lidocaine (HM LIDOCAINE PATCH) 4 % Place 1 patch onto the skin  daily. Patient not taking: Reported on 04/30/2023 07/12/22   Raspet, Noberto Retort, PA-C    Current Facility-Administered Medications  Medication Dose Route Frequency Provider Last Rate Last Admin   acetaminophen (TYLENOL) tablet 650 mg  650 mg Oral Q6H PRN Opyd, Lavone Neri, MD       Or   acetaminophen (TYLENOL) suppository 650 mg  650 mg Rectal Q6H PRN Opyd, Lavone Neri, MD       amLODipine (NORVASC) tablet 5 mg  5 mg Oral Daily Opyd, Lavone Neri, MD       atorvastatin (LIPITOR) tablet 10 mg  10 mg Oral Daily Opyd, Lavone Neri, MD       irbesartan (AVAPRO) tablet 150 mg  150 mg Oral Daily Opyd, Lavone Neri, MD       And   hydrochlorothiazide (HYDRODIURIL) tablet 12.5 mg  12.5 mg Oral Daily Opyd, Lavone Neri, MD       insulin aspart (novoLOG) injection 0-6 Units  0-6 Units Subcutaneous Q4H Opyd, Lavone Neri, MD       insulin glargine-yfgn (SEMGLEE) injection 20 Units  20 Units Subcutaneous Daily Opyd, Lavone Neri, MD       levothyroxine (SYNTHROID) tablet 125 mcg  125 mcg Oral QAC breakfast Opyd, Lavone Neri, MD       senna-docusate (Senokot-S) tablet 1 tablet  1 tablet Oral QHS PRN Opyd, Lavone Neri, MD        Allergies as of 04/30/2023 - Review Complete 04/30/2023  Allergen Reaction Noted   Metformin and related Other (See Comments) 01/19/2014    Family History  Problem Relation Age of Onset   Diabetes Mother    High blood pressure Father     Social History   Socioeconomic History   Marital status: Widowed    Spouse name: Not on file   Number of children: 6   Years of education: 10th   Highest education level: Not on file  Occupational History    Employer: RETIRED    Comment: retired  Tobacco Use   Smoking status: Former    Current packs/day: 0.30    Types: Cigarettes   Smokeless tobacco: Never  Substance and Sexual Activity   Alcohol use: No   Drug use: No   Sexual activity: Never  Other Topics Concern   Not on file  Social History Narrative   Patient lives at home alone widowed.    Retired   One cup of coffee daily.   Right handed.   10th grade education.   Social Determinants of Health   Financial Resource Strain: Not on file  Food Insecurity: No Food Insecurity (05/01/2023)   Hunger Vital Sign    Worried About Running Out of Food in the Last Year: Never true    Ran Out of Food in the Last Year: Never true  Transportation Needs: No Transportation Needs (05/01/2023)   PRAPARE - Administrator, Civil Service (Medical): No    Lack of Transportation (Non-Medical): No  Physical Activity: Not on file  Stress: Not on file  Social Connections: Not on file  Intimate Partner Violence: Not At Risk (05/01/2023)   Humiliation,  Afraid, Rape, and Kick questionnaire    Fear of Current or Ex-Partner: No    Emotionally Abused: No    Physically Abused: No    Sexually Abused: No    Review of Systems: As per HPI  Physical Exam: Vital signs in last 24 hours: Temp:  [97.9 F (36.6 C)-98.9 F (37.2 C)] 98.9 F (37.2 C) (10/31 0819) Pulse Rate:  [56-88] 69 (10/31 0819) Resp:  [14-24] 18 (10/31 0819) BP: (127-167)/(52-115) 167/66 (10/31 0819) SpO2:  [96 %-100 %] 98 % (10/31 0819) Weight:  [88.2 kg] 88.2 kg (10/31 0257) Last BM Date : 04/30/23  General:   Alert,  Well-developed, overweight, pleasant and cooperative in NAD Head:  Normocephalic and atraumatic. Eyes:  Sclera clear, no icterus.   Prominent pallor  Ears:  Normal auditory acuity. Nose:  No deformity, discharge,  or lesions. Mouth:  No deformity or lesions.  Oropharynx pink & moist. Neck:  Supple; no masses or thyromegaly. Lungs:  Clear throughout to auscultation.   No wheezes, crackles, or rhonchi. No acute distress. Heart:  Regular rate and rhythm; no murmurs, clicks, rubs,  or gallops. Extremities:  Without clubbing or edema. Neurologic:  Alert and  oriented x4;  grossly normal neurologically. Skin:  Intact without significant lesions or rashes. Psych:  Alert and cooperative. Normal mood and  affect. Abdomen:  Soft, nontender and nondistended. No masses, hepatosplenomegaly or hernias noted. Normal bowel sounds, without guarding, and without rebound.         Lab Results: Recent Labs    04/30/23 1620 05/01/23 0020  WBC 6.8 6.3  HGB 6.6* 7.9*  HCT 25.1* 27.4*  PLT 449* 399   BMET Recent Labs    04/30/23 1620  NA 140  K 3.1*  CL 103  CO2 25  GLUCOSE 168*  BUN 13  CREATININE 1.10*  CALCIUM 8.5*   LFT Recent Labs    04/30/23 1620  PROT 7.4  ALBUMIN 3.4*  AST 14*  ALT 10  ALKPHOS 81  BILITOT 0.5   PT/INR Recent Labs    04/30/23 1753  LABPROT 13.3  INR 1.0    Studies/Results: DG Chest 1 View  Result Date: 04/30/2023 CLINICAL DATA:  Shortness of breath. Generalized weakness for 3 days. EXAM: CHEST  1 VIEW COMPARISON:  10/23/2021 FINDINGS: Mild cardiac enlargement. No vascular congestion, edema, or consolidation. No pleural effusions. No pneumothorax. Mediastinal contours appear intact. Calcification of the aorta. Degenerative changes in the spine and shoulders. Surgical clips in the base of the neck. IMPRESSION: Mild cardiac enlargement.  No evidence of active pulmonary disease. Electronically Signed   By: Burman Nieves M.D.   On: 04/30/2023 19:06    Impression: Severe symptomatic anemia, recent black stools Hemoglobin 6.6 on admission, 7.9 after 1 unit PRBC transfusion Microcytosis, MCV 63.2 Reactive thrombocytosis, platelet 449  Longstanding history of iron deficiency anemia with unremarkable EGD and colonoscopy from 09/06/2022  Potassium 3.1 Mild renal impairment, creatinine 1.1, GFR 50  Plan: As patient has had an EGD and colonoscopy for iron deficiency anemia as an outpatient which were fairly unremarkable and had a CAT scan in 03/2022 without obvious cause of anemia, recommend small bowel PillCam as the next step for evaluation. The risks and the benefits of the procedure were discussed with the patient in details. She understands and  verbalizes consent. I will also send labs for celiac serology. I discussed with patient that she will likely need to be on oral iron supplementation indefinitely, will start on ferrous sulfate  325 mg p.o. daily. Depending upon capsule endoscopy, she may need periodic IV iron, blood transfusion and monitoring with CBC and iron panel/ferritin every 2 to 3 months.   LOS: 0 days   Kerin Salen, MD  05/01/2023, 8:22 AM

## 2023-05-01 NOTE — Progress Notes (Addendum)
   05/01/23 1605  Mobility  Activity Ambulated with assistance in hallway;Ambulated with assistance to bathroom  Level of Assistance Contact guard assist, steadying assist  Assistive Device None;Other (Comment) (IV Pole)  Distance Ambulated (ft) 2253 ft  Activity Response Tolerated fair;Tolerated well  Mobility Referral Yes  $Mobility charge 1 Mobility  Mobility Specialist Start Time (ACUTE ONLY) 1540  Mobility Specialist Stop Time (ACUTE ONLY) 1605  Mobility Specialist Time Calculation (min) (ACUTE ONLY) 25 min   Mobility Specialist: Progress Note  Pre-Mobility: BP 164/59 (91) Post-Mobility: BP 175/65 (97)  Pt agreeable to mobility session - received in bed. Required CG using IV pole and no AD. Pt was asymptomatic throughout session with no complaints. Returned to chair with all needs met - call bell within reach.   Pt stated she uses a cane at baseline.   Barnie Mort, BS Mobility Specialist Please contact via SecureChat or Rehab office at [phone].

## 2023-05-01 NOTE — ED Notes (Signed)
ED TO INPATIENT HANDOFF REPORT  ED Nurse Name and Phone #:  Desma Mcgregor Name/Age/Gender Diane Knight 82 y.o. female Room/Bed: 019C/019C  Code Status   Code Status: Full Code  Home/SNF/Other Home Patient oriented to: self, place, time, and situation Is this baseline? Yes   Triage Complete: Triage complete  Chief Complaint Symptomatic anemia [D64.9]  Triage Note Pt is coming in at the request of her pcp, sates she saw her doctor for generally feeling unwell in which they took blood from her and called her today to come in to get " iron or a blood transfusion". Unable to see if this is true in the the chart review of the patient but she mentions she has received blood transfusions in the past. She endorses some dizziness, shortness of breath and general weakness as symptoms she has been having.    Allergies Allergies  Allergen Reactions   Metformin And Related Other (See Comments)    GI SIDE EFFECTS    Level of Care/Admitting Diagnosis ED Disposition     ED Disposition  Admit   Condition  --   Comment  Hospital Area: [facility] [[id number]]  Level of Care: Med-Surg [16]  May place patient in observation at [facility] or [facility] if equivalent level of care is available:: Yes  Covid Evaluation: Asymptomatic - no recent exposure (last 10 days) testing not required  Diagnosis: Symptomatic anemia [0102725]  Admitting Physician: Briscoe Deutscher [[id number]]  Attending Physician: Briscoe Deutscher [[id number]]          B Medical/Surgery History Past Medical History:  Diagnosis Date   Cataract    Diabetes mellitus    TYPE 2    Fibromyalgia    Goiter    Hyperlipidemia    Hypertension    Neuropathy    Tobacco dependence    Past Surgical History:  Procedure Laterality Date   ABDOMINAL HYSTERECTOMY     EYE SURGERY     THYROIDECTOMY N/A 08/04/2017   Procedure: TOTAL THYROIDECTOMY;  Surgeon: Darnell Level, MD;  Location: [facility];  Service: General;   Laterality: N/A;     A IV Location/Drains/Wounds Patient Lines/Drains/Airways Status     Active Line/Drains/Airways     Name Placement date Placement time Site Days   Peripheral IV 04/30/23 20 G Left Forearm 04/30/23  1754  Forearm  1            Intake/Output Last 24 hours No intake or output data in the 24 hours ending 05/01/23 0007  Labs/Imaging Results for orders placed or performed during the hospital encounter of 04/30/23 (from the past 48 hour(s))  Comprehensive metabolic panel     Status: Abnormal   Collection Time: 04/30/23  4:20 PM  Result Value Ref Range   Sodium 140 135 - 145 mmol/L   Potassium 3.1 (L) 3.5 - 5.1 mmol/L   Chloride 103 98 - 111 mmol/L   CO2 25 22 - 32 mmol/L   Glucose, Bld 168 (H) 70 - 99 mg/dL    Comment: Glucose reference range applies only to samples taken after fasting for at least 8 hours.   BUN 13 8 - 23 mg/dL   Creatinine, Ser 6.38 (H) 0.44 - 1.00 mg/dL   Calcium 8.5 (L) 8.9 - 10.3 mg/dL   Total Protein 7.4 6.5 - 8.1 g/dL   Albumin 3.4 (L) 3.5 - 5.0 g/dL   AST 14 (L) 15 - 41 U/L   ALT 10 0 -  44 U/L   Alkaline Phosphatase 81 38 - 126 U/L   Total Bilirubin 0.5 0.3 - 1.2 mg/dL   GFR, Estimated 50 (L) >60 mL/min    Comment: (NOTE) Calculated using the CKD-EPI Creatinine Equation (2021)    Anion gap 12 5 - 15    Comment: Performed at [facility], [address], [place], [place] [address]  CBC     Status: Abnormal   Collection Time: 04/30/23  4:20 PM  Result Value Ref Range   WBC 6.8 4.0 - 10.5 K/uL   RBC 3.97 3.87 - 5.11 MIL/uL   Hemoglobin 6.6 (LL) 12.0 - 15.0 g/dL    Comment: REPEATED TO VERIFY Reticulocyte Hemoglobin testing may be clinically indicated, consider ordering this additional test [id number] THIS CRITICAL RESULT HAS VERIFIED AND BEEN CALLED TO SETH WARD, RN BY SHAY EKDAHL ON 10 30 2024 AT 1701, AND HAS BEEN READ BACK.     HCT 25.1 (L) 36.0 - 46.0 %   MCV 63.2 (L) 80.0 - 100.0 fL   MCH 16.6 (L) 26.0 - 34.0 pg    MCHC 26.3 (L) 30.0 - 36.0 g/dL   RDW 06.3 (H) 01.6 - 01.0 %   Platelets 449 (H) 150 - 400 K/uL    Comment: REPEATED TO VERIFY   nRBC 0.3 (H) 0.0 - 0.2 %    Comment: Performed at [facility], [address], [place], [place] [address]  Prepare RBC (crossmatch)     Status: None   Collection Time: 04/30/23  5:39 PM  Result Value Ref Range   Order Confirmation      ORDER PROCESSED BY BLOOD BANK Performed at [facility], [address], [place], [place] [address]   Protime-INR     Status: None   Collection Time: 04/30/23  5:53 PM  Result Value Ref Range   Prothrombin Time 13.3 11.4 - 15.2 seconds   INR 1.0 0.8 - 1.2    Comment: (NOTE) INR goal varies based on device and disease states. Performed at [facility], [address] [place], [place] [address]   Type and screen [facility]     Status: None (Preliminary result)   Collection Time: 04/30/23  5:54 PM  Result Value Ref Range   ABO/RH(D) O POS    Antibody Screen NEG    Sample Expiration 05/03/2023,2359    Unit Number [id number]    Blood Component Type RED CELLS,LR    Unit division 00    Status of Unit ISSUED    Transfusion Status OK TO TRANSFUSE    Crossmatch Result      Compatible Performed at [facility], [address], [place], [place] [address]   POC occult blood, ED RN will collect     Status: None   Collection Time: 04/30/23  9:07 PM  Result Value Ref Range   Fecal Occult Bld NEGATIVE NEGATIVE   DG Chest 1 View  Result Date: 04/30/2023 CLINICAL DATA:  Shortness of breath. Generalized weakness for 3 days. EXAM: CHEST  1 VIEW COMPARISON:  10/23/2021 FINDINGS: Mild cardiac enlargement. No vascular congestion, edema, or consolidation. No pleural effusions. No pneumothorax. Mediastinal contours appear intact. Calcification of the aorta. Degenerative changes in the spine and shoulders. Surgical clips in the base of the neck. IMPRESSION: Mild cardiac enlargement.  No  evidence of active pulmonary disease. Electronically Signed   By: Burman Nieves M.D.   On: 04/30/2023 19:06  Pending Labs Unresulted Labs (From admission, onward)     Start     Ordered   05/01/23 0500  Hemoglobin A1c  Tomorrow morning,   R       Comments: To assess prior glycemic control    04/30/23 2246   05/01/23 0500  Basic metabolic panel  Daily,   R      04/30/23 2246   04/30/23 2246  CBC  Now then every 12 hours,   R (with TIMED occurrences)      04/30/23 2246            Vitals/Pain Today's Vitals   04/30/23 2058 04/30/23 2100 04/30/23 2106 04/30/23 2115  BP:  (!) 150/52  (!) 143/66  Pulse:  71  67  Resp:  (!) 23  (!) 22  Temp:   98.4 F (36.9 C)   TempSrc:   Oral   SpO2:  99%  98%  PainSc: 6        Isolation Precautions No active isolations  Medications Medications  pantoprazole (PROTONIX) injection 40 mg (has no administration in time range)  amLODipine (NORVASC) tablet 5 mg (has no administration in time range)  atorvastatin (LIPITOR) tablet 10 mg (has no administration in time range)  levothyroxine (SYNTHROID) tablet 125 mcg (has no administration in time range)  insulin glargine-yfgn (SEMGLEE) injection 20 Units (has no administration in time range)  insulin aspart (novoLOG) injection 0-6 Units (has no administration in time range)  acetaminophen (TYLENOL) tablet 650 mg (has no administration in time range)    Or  acetaminophen (TYLENOL) suppository 650 mg (has no administration in time range)  senna-docusate (Senokot-S) tablet 1 tablet (has no administration in time range)  irbesartan (AVAPRO) tablet 150 mg (has no administration in time range)    And  hydrochlorothiazide (HYDRODIURIL) tablet 12.5 mg (has no administration in time range)  0.9 %  sodium chloride infusion (Manually program via Guardrails IV Fluids) ( Intravenous New Bag/Given 04/30/23 2020)  potassium chloride SA (KLOR-CON M) CR tablet 40 mEq (40 mEq Oral Given 04/30/23 1905)     Mobility walks with device at baseline        R Recommendations: See Admitting Provider Note  Report given to:   Additional Notes:  A&Ox4, continent x2

## 2023-05-01 NOTE — Plan of Care (Signed)
  Problem: Education: Goal: Ability to describe self-care measures that may prevent or decrease complications (Diabetes Survival Skills Education) will improve Outcome: Progressing   Problem: Skin Integrity: Goal: Risk for impaired skin integrity will decrease Outcome: Progressing   Problem: Education: Goal: Knowledge of General Education information will improve Description: Including pain rating scale, medication(s)/side effects and non-pharmacologic comfort measures Outcome: Progressing   Problem: Activity: Goal: Risk for activity intolerance will decrease Outcome: Progressing

## 2023-05-01 NOTE — Progress Notes (Signed)
Pt excreted capsule placed by endo. Collected the capsule in specimen cup with pt's label. Notified Endo at [phone] left vm with info to contact dept to pick up capsule and monitor.

## 2023-05-01 NOTE — Progress Notes (Signed)
[facility] capsule endoscopy ordered by MD Marca Ancona.  Patient ingested capsule at 1051.  Per Given's capsule instructions, patient to remain NPO until 1251 at which time they may progress to clear liquid diet. At 1451 patient may have a small snack such as a half a sandwich or a bowl of soup. At 1851 patient may progress to previously ordered diet.  The capsule endoscopy study will conclude at 2251 at which time the recorder and leads or belt can be removed and placed in a patient belongings bag. Endoscopy staff will pick up the equipment in the AM.  Instructions provided to patient and inpatient RN. Patient demonstrated understanding of instructions.  Eulas Post, RN 05/01/23 10:56 AM

## 2023-05-01 NOTE — Progress Notes (Signed)
PROGRESS NOTE  Diane Knight  WUJ:[id number] DOB: 12-21-1940 DOA: 04/30/2023 PCP: Renford Dills, MD   Brief Narrative: Patient is 82 year old female with history of peripheral artery disease, type 2 diabetes, hypertension, hyperlipidemia, CKD stage III, iron deficiency anemia who presented with generalized weakness, lightheadedness, exertional dyspnea.  Found to have low hemoglobin on outpatient blood work.  Report of darker stool, intermittent abdominal discomfort, vomiting.  On presentation she was hemodynamically stable.  Lab work showed hemoglobin of 6.6, potassium of 3.1.  GI consulted for the suspicion of GI bleed.  Started on IV Protonix.  Given a unit of blood transfusion.  Plan for video capsule endoscopy today  Assessment & Plan:  Principal Problem:   Symptomatic anemia Active Problems:   DM (diabetes mellitus) (HCC)   Hypertension   CKD stage 3a, GFR 45-59 ml/min (HCC)  Symptomatic anemia: Presented with hemoglobin of 6.6.  Hemoglobin was 9.6 on June 2024.  Hemodynamically stable.  FOBT negative.  Started on PPI.  Given a unit of blood transfusion.  Hemoglobin in the range of 7 today.  GI following,planning for video capsule endoscopy.  She says her bowel movements have not been darker like before.  She had EGD and colonoscopy on 09/26/2022, found to have 1 tubular adenoma, internal/external hemorrhoids but no source of bleeding. Continue to monitor H&H  Hypertension: Currently blood pressure stable.  On Norvasc, valsartan-hydrochlorothiazide  Insulin-dependent diabetes type 2: A1c of 6.7 as per lab work on July 24.  Monitor blood sugars  CKD stage IIIa: Currently kidney function at baseline.         DVT prophylaxis:SCDs Start: 04/30/23 2245     Code Status: Full Code  Family Communication: None at the bedside  Patient status:Obs  Patient is from :home  Anticipated discharge NF:AOZH  Estimated DC date:1-2 days   Consultants: GI   Procedures:None  yet  Antimicrobials:  Anti-infectives (From admission, onward)    None       Subjective: Patient seen and examined at bedside today.  Hemodynamically stable.  Denies any abdomen pain, nausea or vomiting.  Had few loose bowel movements after admission which are not dark like before.  She lives alone.  No problem with ambulation.  Objective: Vitals:   05/01/23 0100 05/01/23 0150 05/01/23 0257 05/01/23 0431  BP: (!) 145/96 (!) 167/65 (!) 167/65 (!) 138/54  Pulse: 72 68 68 (!) 56  Resp: (!) 23 17 17 19   Temp:  97.9 F (36.6 C) 97.9 F (36.6 C) 98 F (36.7 C)  TempSrc:   Oral   SpO2: 99% 97%  96%  Weight:   88.2 kg   Height:   5\' 6"  (1.676 m)     Intake/Output Summary (Last 24 hours) at 05/01/2023 0865 Last data filed at 04/30/2023 2310 Gross per 24 hour  Intake 315 ml  Output --  Net 315 ml   Filed Weights   05/01/23 0257  Weight: 88.2 kg    Examination:  General exam: Overall comfortable, not in distress, obese, pleasant elderly female HEENT: PERRL Respiratory system:  no wheezes or crackles  Cardiovascular system: S1 & S2 heard, RRR.  Gastrointestinal system: Abdomen is nondistended, soft and nontender. Central nervous system: Alert and oriented Extremities: No edema, no clubbing ,no cyanosis Skin: No rashes, no ulcers,no icterus     Data Reviewed: I have personally reviewed following labs and imaging studies  CBC: Recent Labs  Lab 04/30/23 1620 05/01/23 0020  WBC 6.8 6.3  HGB 6.6* 7.9*  HCT 25.1*  27.4*  MCV 63.2* 65.7*  PLT 449* 399   Basic Metabolic Panel: Recent Labs  Lab 04/30/23 1620  NA 140  K 3.1*  CL 103  CO2 25  GLUCOSE 168*  BUN 13  CREATININE 1.10*  CALCIUM 8.5*     No results found for this or any previous visit (from the past 240 hour(s)).   Radiology Studies: DG Chest 1 View  Result Date: 04/30/2023 CLINICAL DATA:  Shortness of breath. Generalized weakness for 3 days. EXAM: CHEST  1 VIEW COMPARISON:  10/23/2021  FINDINGS: Mild cardiac enlargement. No vascular congestion, edema, or consolidation. No pleural effusions. No pneumothorax. Mediastinal contours appear intact. Calcification of the aorta. Degenerative changes in the spine and shoulders. Surgical clips in the base of the neck. IMPRESSION: Mild cardiac enlargement.  No evidence of active pulmonary disease. Electronically Signed   By: Burman Nieves M.D.   On: 04/30/2023 19:06    Scheduled Meds:  amLODipine  5 mg Oral Daily   atorvastatin  10 mg Oral Daily   irbesartan  150 mg Oral Daily   And   hydrochlorothiazide  12.5 mg Oral Daily   insulin aspart  0-6 Units Subcutaneous Q4H   insulin glargine-yfgn  20 Units Subcutaneous Daily   levothyroxine  125 mcg Oral QAC breakfast   Continuous Infusions:   LOS: 0 days   Burnadette Pop, MD [facility] P10/31/2024, 8:12 AM

## 2023-05-02 ENCOUNTER — Other Ambulatory Visit: Payer: Self-pay | Admitting: Hematology and Oncology

## 2023-05-02 DIAGNOSIS — D500 Iron deficiency anemia secondary to blood loss (chronic): Secondary | ICD-10-CM | POA: Diagnosis not present

## 2023-05-02 DIAGNOSIS — D649 Anemia, unspecified: Secondary | ICD-10-CM

## 2023-05-02 LAB — GLUCOSE, CAPILLARY
Glucose-Capillary: 153 mg/dL — ABNORMAL HIGH (ref 70–99)
Glucose-Capillary: 155 mg/dL — ABNORMAL HIGH (ref 70–99)
Glucose-Capillary: 174 mg/dL — ABNORMAL HIGH (ref 70–99)
Glucose-Capillary: 199 mg/dL — ABNORMAL HIGH (ref 70–99)

## 2023-05-02 LAB — CBC
HCT: 28.2 % — ABNORMAL LOW (ref 36.0–46.0)
Hemoglobin: 7.9 g/dL — ABNORMAL LOW (ref 12.0–15.0)
MCH: 18.2 pg — ABNORMAL LOW (ref 26.0–34.0)
MCHC: 28 g/dL — ABNORMAL LOW (ref 30.0–36.0)
MCV: 65.1 fL — ABNORMAL LOW (ref 80.0–100.0)
Platelets: 406 10*3/uL — ABNORMAL HIGH (ref 150–400)
RBC: 4.33 MIL/uL (ref 3.87–5.11)
RDW: 26.7 % — ABNORMAL HIGH (ref 11.5–15.5)
WBC: 7 10*3/uL (ref 4.0–10.5)
nRBC: 0 % (ref 0.0–0.2)

## 2023-05-02 LAB — IRON AND TIBC
Iron: 107 ug/dL (ref 28–170)
Saturation Ratios: 25 % (ref 10.4–31.8)
TIBC: 428 ug/dL (ref 250–450)
UIBC: 321 ug/dL

## 2023-05-02 MED ORDER — HYDRALAZINE HCL 20 MG/ML IJ SOLN: 5.0000 mg | Freq: Four times a day (QID) | INTRAMUSCULAR | Status: AC | PRN

## 2023-05-02 MED ORDER — AMLODIPINE BESYLATE 10 MG PO TABS: 10.0000 mg | ORAL_TABLET | Freq: Every day | ORAL | Status: DC

## 2023-05-02 MED ORDER — PANTOPRAZOLE SODIUM 40 MG PO TBEC
40.0000 mg | DELAYED_RELEASE_TABLET | Freq: Every day | ORAL | Status: DC
  Administered 2023-05-02: 40 mg via ORAL
  Filled 2023-05-02: qty 1

## 2023-05-02 MED ORDER — AMLODIPINE BESYLATE 10 MG PO TABS: 10.0000 mg | ORAL_TABLET | Freq: Every day | ORAL | 1 refills | Status: AC

## 2023-05-02 MED ORDER — LABETALOL HCL 5 MG/ML IV SOLN: 10.0000 mg | INTRAVENOUS | Status: DC | PRN

## 2023-05-02 MED ORDER — AMLODIPINE BESYLATE 5 MG PO TABS
5.0000 mg | ORAL_TABLET | Freq: Once | ORAL | Status: AC
  Administered 2023-05-02: 5 mg via ORAL
  Filled 2023-05-02: qty 1

## 2023-05-02 MED ORDER — FERROUS SULFATE 325 (65 FE) MG PO TABS: 325.0000 mg | ORAL_TABLET | Freq: Every day | ORAL | 1 refills | Status: AC

## 2023-05-02 MED ORDER — PANTOPRAZOLE SODIUM 40 MG PO TBEC: 40.0000 mg | DELAYED_RELEASE_TABLET | Freq: Every day | ORAL | 0 refills | Status: AC

## 2023-05-02 NOTE — Progress Notes (Signed)
[facility] Progress Note  Diane Knight 82 y.o. 28-Jan-1941  CC: Anemia   Subjective: Patient seen and examined at bedside.  She denies any bleeding episodes.  Denies abdominal pain.  Had some diarrhea yesterday which has improved today.  He wants to go home.  ROS : Afebrile, negative for chest pain   Objective: Vital signs in last 24 hours: Vitals:   05/02/23 0442 05/02/23 0749  BP: (!) 163/62 (!) 170/70  Pulse: 71 65  Resp: 17 19  Temp: 98.1 F (36.7 C) 98.1 F (36.7 C)  SpO2: 97% 98%    Physical Exam: Elderly patient, resting comfortably, not in acute distress.  Abdominal exam benign.  Lab Results: Recent Labs    04/30/23 1620 05/01/23 0920  NA 140 141  K 3.1* 3.7  CL 103 106  CO2 25 26  GLUCOSE 168* 137*  BUN 13 9  CREATININE 1.10* 0.89  CALCIUM 8.5* 8.5*   Recent Labs    04/30/23 1620  AST 14*  ALT 10  ALKPHOS 81  BILITOT 0.5  PROT 7.4  ALBUMIN 3.4*   Recent Labs    05/01/23 0920 05/01/23 2216  WBC 6.5 7.4  HGB 7.8* 7.5*  HCT 27.8* 26.3*  MCV 65.3* 64.8*  PLT 420* 395   Recent Labs    04/30/23 1753  LABPROT 13.3  INR 1.0      Assessment/Plan: -Symptomatic anemia with history of black stools.  Patient denies any further bleeding.  Capsule endoscopy showed some gastric and duodenal erosions without any active bleeding. -Gastritis and duodenitis -Chronic iron deficiency anemia.  Occult blood -2 days ago as well as earlier this year.  Recommendations ------------------------- -No further inpatient GI workup planned. -ok  to use Imodium as needed for diarrhea.   -Recommend to take Protonix 40 mg once a day for gastritis and duodenitis which was seen earlier this year on EGD as well as recent capsule endoscopy. -Recommend hematology consult for anemia if not already done.  Can be done as an outpatient.  Patient has dark stool could be from iron supplements.  Occult blood negative x 2 this year. - GI will sign off.  Call us  back if needed.   Kathi Der MD, FACP 05/02/2023, 9:03 AM  Contact #  [phone]

## 2023-05-02 NOTE — Discharge Summary (Signed)
Physician Discharge Summary  Diane Knight OZH:[id number] DOB: Dec 24, 1940 DOA: 04/30/2023  PCP: Renford Dills, MD  Admit date: 04/30/2023 Discharge date: 05/02/2023  Admitted From: Home Disposition:  Home  Discharge Condition:Stable CODE STATUS:FULL Diet recommendation: Heart Healthy  Brief/Interim Summary: Patient is 82 year old female with history of peripheral artery disease, type 2 diabetes, hypertension, hyperlipidemia, CKD stage III, iron deficiency anemia who presented with generalized weakness, lightheadedness, exertional dyspnea.  Found to have low hemoglobin on outpatient blood work.  Report of darker stool, intermittent abdominal discomfort, vomiting.  On presentation she was hemodynamically stable.  Lab work showed hemoglobin of 6.6, potassium of 3.1.  GI consulted for the suspicion of GI bleed.  Started on IV Protonix.  Given a unit of blood transfusion. S/p video capsule endoscopy , which showed some gastric, duodenal erosions without any active bleeding.  GI signed off recommending daily PPI.  Patient is medically stable for discharge home today.  We recommend to follow-up with hematology as an outpatient to evaluate her anemia,hematology will arrange outpatient follow up.  Following problems were addressed during the hospitalization:  Symptomatic anemia: Presented with hemoglobin of 6.6.  Hemoglobin was 9.6 on June 2024.  Hemodynamically stable.  FOBT negative.  Started on PPI.  Given a unit of blood transfusion.    GI following,s/p video capsule endoscopy which showed some gastric, duodenal erosions without any active bleeding.  GI signed off recommending daily PPI.   She says her bowel movements are more rare, previous dark stool could be from iron.  She had EGD and colonoscopy on 09/26/2022, found to have 1 tubular adenoma, internal/external hemorrhoids but no source of bleeding. Continue to monitor H&H as an outpatient. We recommend to follow-up with hematology as an  outpatient.  Message sent to Dr. Bertis Ruddy.  She will be called for appointment   Hypertension: Currently blood pressure up.  On Norvasc, valsartan-hydrochlorothiazide.  Will increase the dose of Norvasc.     Insulin-dependent diabetes type 2: A1c of 6.7 as per lab work on July 24.  She is on insulin and other medications at home.   CKD stage IIIa: Currently kidney function at baseline.  Discharge Diagnoses:  Principal Problem:   Symptomatic anemia Active Problems:   DM (diabetes mellitus) (HCC)   Hypertension   CKD stage 3a, GFR 45-59 ml/min [facility])    Discharge Instructions  Discharge Instructions     Diet - low sodium heart healthy   Complete by: As directed    Discharge instructions   Complete by: As directed    1)Please take prescribed medications as instructed 2)Follow up with your PCP in a week.  During the follow-up, do a CBC test to check your hemoglobin 3)Monitor your blood pressure at home 4)We recommend to follow up with hematology to further  evaluate your anemia.  We have provided a referral, you will be called for appointment   Increase activity slowly   Complete by: As directed       Allergies as of 05/02/2023       Reactions   Metformin And Related Other (See Comments)   GI SIDE EFFECTS        Medication List     STOP taking these medications    aspirin EC 81 MG tablet       TAKE these medications    acetaminophen 500 MG tablet Commonly known as: TYLENOL Take 500 mg by mouth every 6 (six) hours as needed for mild pain or headache.   amLODipine 10 MG  tablet Commonly known as: NORVASC Take 1 tablet (10 mg total) by mouth daily. Start taking on: May 03, 2023 What changed:  medication strength how much to take   atorvastatin 10 MG tablet Commonly known as: LIPITOR Take 10 mg by mouth daily.   calcium carbonate 500 MG chewable tablet Commonly known as: Tums Chew 3 tablets (600 mg of elemental calcium total) by mouth 4 (four) times  daily -  before meals and at bedtime.   ferrous sulfate 325 (65 FE) MG tablet Take 1 tablet (325 mg total) by mouth daily with breakfast. Start taking on: May 03, 2023 What changed:  medication strength when to take this   HumaLOG Mix 75/25 KwikPen (75-25) 100 UNIT/ML KwikPen Generic drug: Insulin Lispro Prot & Lispro Inject 50-55 Units into the skin See admin instructions. Inject 55 units subcutaneous in the morning and then inject 50 units subcutaneous in the evening   levothyroxine 125 MCG tablet Commonly known as: SYNTHROID Take 125 mcg by mouth daily before breakfast.   lidocaine 4 % Commonly known as: HM Lidocaine Patch Place 1 patch onto the skin daily.   metFORMIN 500 MG 24 hr tablet Commonly known as: GLUCOPHAGE-XR Take 500 mg by mouth every evening.   pantoprazole 40 MG tablet Commonly known as: PROTONIX Take 1 tablet (40 mg total) by mouth daily.   Trulicity 1.5 MG/0.5ML Soaj Generic drug: Dulaglutide Inject 1.5 mg into the skin once a week.   valsartan-hydrochlorothiazide 160-12.5 MG tablet Commonly known as: DIOVAN-HCT Take 1 tablet by mouth daily.        Follow-up Information     Renford Dills, MD. Schedule an appointment as soon as possible for a visit in 1 week(s).   Specialty: Internal Medicine Contact information: [address] [place] [address] [phone]                Allergies  Allergen Reactions   Metformin And Related Other (See Comments)    GI SIDE EFFECTS    Consultations: GI   Procedures/Studies: DG Chest 1 View  Result Date: 04/30/2023 CLINICAL DATA:  Shortness of breath. Generalized weakness for 3 days. EXAM: CHEST  1 VIEW COMPARISON:  10/23/2021 FINDINGS: Mild cardiac enlargement. No vascular congestion, edema, or consolidation. No pleural effusions. No pneumothorax. Mediastinal contours appear intact. Calcification of the aorta. Degenerative changes in the spine and shoulders. Surgical  clips in the base of the neck. IMPRESSION: Mild cardiac enlargement.  No evidence of active pulmonary disease. Electronically Signed   By: Burman Nieves M.D.   On: 04/30/2023 19:06      Subjective: Patient seen and examined at bedside today.  Hemodynamically stable comfortable.  Her bowel movements are not dark like before.  Medically stable for discharge.I called her son Tomma Lightning to update about dc planning,call not received  Discharge Exam: Vitals:   05/02/23 0915 05/02/23 1221  BP: (!) 182/63 (!) 170/66  Pulse:    Resp:    Temp:    SpO2:     Vitals:   05/02/23 0444 05/02/23 0749 05/02/23 0915 05/02/23 1221  BP:  (!) 170/70 (!) 182/63 (!) 170/66  Pulse:  65    Resp:  19    Temp:  98.1 F (36.7 C)    TempSrc:  Oral    SpO2:  98%    Weight: 89.3 kg     Height:        General: Pt is alert, awake, not in acute distress Cardiovascular: RRR, S1/S2 +,  no rubs, no gallops Respiratory: CTA bilaterally, no wheezing, no rhonchi Abdominal: Soft, NT, ND, bowel sounds + Extremities: no edema, no cyanosis    The results of significant diagnostics from this hospitalization (including imaging, microbiology, ancillary and laboratory) are listed below for reference.     Microbiology: No results found for this or any previous visit (from the past 240 hour(s)).   Labs: BNP (last 3 results) No results for input(s): "BNP" in the last 8760 hours. Basic Metabolic Panel: Recent Labs  Lab 04/30/23 1620 05/01/23 0920  NA 140 141  K 3.1* 3.7  CL 103 106  CO2 25 26  GLUCOSE 168* 137*  BUN 13 9  CREATININE 1.10* 0.89  CALCIUM 8.5* 8.5*   Liver Function Tests: Recent Labs  Lab 04/30/23 1620  AST 14*  ALT 10  ALKPHOS 81  BILITOT 0.5  PROT 7.4  ALBUMIN 3.4*   No results for input(s): "LIPASE", "AMYLASE" in the last 168 hours. No results for input(s): "AMMONIA" in the last 168 hours. CBC: Recent Labs  Lab 04/30/23 1620 05/01/23 0020 05/01/23 0920 05/01/23 2216  05/02/23 1050  WBC 6.8 6.3 6.5 7.4 7.0  HGB 6.6* 7.9* 7.8* 7.5* 7.9*  HCT 25.1* 27.4* 27.8* 26.3* 28.2*  MCV 63.2* 65.7* 65.3* 64.8* 65.1*  PLT 449* 399 420* 395 406*   Cardiac Enzymes: No results for input(s): "CKTOTAL", "CKMB", "CKMBINDEX", "TROPONINI" in the last 168 hours. BNP: Invalid input(s): "POCBNP" CBG: Recent Labs  Lab 05/01/23 2225 05/02/23 0040 05/02/23 0443 05/02/23 0747 05/02/23 1217  GLUCAP 168* 174* 155* 153* 199*   D-Dimer No results for input(s): "DDIMER" in the last 72 hours. Hgb A1c Recent Labs    05/01/23 0920  HGBA1C 6.5*   Lipid Profile No results for input(s): "CHOL", "HDL", "LDLCALC", "TRIG", "CHOLHDL", "LDLDIRECT" in the last 72 hours. Thyroid function studies No results for input(s): "TSH", "T4TOTAL", "T3FREE", "THYROIDAB" in the last 72 hours.  Invalid input(s): "FREET3" Anemia work up No results for input(s): "VITAMINB12", "FOLATE", "FERRITIN", "TIBC", "IRON", "RETICCTPCT" in the last 72 hours. Urinalysis    Component Value Date/Time   COLORURINE YELLOW 04/25/2018 0122   APPEARANCEUR CLEAR 04/25/2018 0122   LABSPEC 1.015 07/12/2022 1510   PHURINE 5.5 07/12/2022 1510   GLUCOSEU NEGATIVE 07/12/2022 1510   HGBUR NEGATIVE 07/12/2022 1510   BILIRUBINUR NEGATIVE 07/12/2022 1510   KETONESUR NEGATIVE 07/12/2022 1510   PROTEINUR NEGATIVE 07/12/2022 1510   UROBILINOGEN 0.2 07/12/2022 1510   NITRITE NEGATIVE 07/12/2022 1510   LEUKOCYTESUR NEGATIVE 07/12/2022 1510   Sepsis Labs Recent Labs  Lab 05/01/23 0020 05/01/23 0920 05/01/23 2216 05/02/23 1050  WBC 6.3 6.5 7.4 7.0   Microbiology No results found for this or any previous visit (from the past 240 hour(s)).  Please note: You were cared for by a hospitalist during your hospital stay. Once you are discharged, your primary care physician will handle any further medical issues. Please note that NO REFILLS for any discharge medications will be authorized once you are discharged, as it  is imperative that you return to your primary care physician (or establish a relationship with a primary care physician if you do not have one) for your post hospital discharge needs so that they can reassess your need for medications and monitor your lab values.    Time coordinating discharge: 40 minutes  SIGNED:   Burnadette Pop, MD  [facility] 05/02/2023, 1:18 PM Pager [phone]  If 7PM-7AM, please contact night-coverage [email or web address] Password [email or web address]

## 2023-05-02 NOTE — Plan of Care (Signed)
Problem: Education: Goal: Ability to describe self-care measures that may prevent or decrease complications (Diabetes Survival Skills Education) will improve 05/02/2023 0525 by Aldean Baker, RN Outcome: Progressing 05/02/2023 0525 by Aldean Baker, RN Outcome: Progressing Goal: Individualized Educational Video(s) 05/02/2023 0525 by Aldean Baker, RN Outcome: Progressing 05/02/2023 0525 by Aldean Baker, RN Outcome: Progressing   Problem: Coping: Goal: Ability to adjust to condition or change in health will improve 05/02/2023 0525 by Aldean Baker, RN Outcome: Progressing 05/02/2023 0525 by Aldean Baker, RN Outcome: Progressing   Problem: Fluid Volume: Goal: Ability to maintain a balanced intake and output will improve 05/02/2023 0525 by Aldean Baker, RN Outcome: Progressing 05/02/2023 0525 by Aldean Baker, RN Outcome: Progressing   Problem: Health Behavior/Discharge Planning: Goal: Ability to identify and utilize available resources and services will improve 05/02/2023 0525 by Aldean Baker, RN Outcome: Progressing 05/02/2023 0525 by Frances Furbish T, RN Outcome: Progressing Goal: Ability to manage health-related needs will improve 05/02/2023 0525 by Aldean Baker, RN Outcome: Progressing 05/02/2023 0525 by Frances Furbish T, RN Outcome: Progressing   Problem: Metabolic: Goal: Ability to maintain appropriate glucose levels will improve 05/02/2023 0525 by Aldean Baker, RN Outcome: Progressing 05/02/2023 0525 by Frances Furbish T, RN Outcome: Progressing   Problem: Nutritional: Goal: Maintenance of adequate nutrition will improve 05/02/2023 0525 by Aldean Baker, RN Outcome: Progressing 05/02/2023 0525 by Frances Furbish T, RN Outcome: Progressing Goal: Progress toward achieving an optimal weight will improve 05/02/2023 0525 by Aldean Baker, RN Outcome:  Progressing 05/02/2023 0525 by Frances Furbish T, RN Outcome: Progressing   Problem: Skin Integrity: Goal: Risk for impaired skin integrity will decrease 05/02/2023 0525 by Aldean Baker, RN Outcome: Progressing 05/02/2023 0525 by Frances Furbish T, RN Outcome: Progressing   Problem: Tissue Perfusion: Goal: Adequacy of tissue perfusion will improve 05/02/2023 0525 by Aldean Baker, RN Outcome: Progressing 05/02/2023 0525 by Aldean Baker, RN Outcome: Progressing   Problem: Education: Goal: Knowledge of General Education information will improve Description: Including pain rating scale, medication(s)/side effects and non-pharmacologic comfort measures 05/02/2023 0525 by Aldean Baker, RN Outcome: Progressing 05/02/2023 0525 by Aldean Baker, RN Outcome: Progressing   Problem: Health Behavior/Discharge Planning: Goal: Ability to manage health-related needs will improve 05/02/2023 0525 by Aldean Baker, RN Outcome: Progressing 05/02/2023 0525 by Frances Furbish T, RN Outcome: Progressing   Problem: Clinical Measurements: Goal: Ability to maintain clinical measurements within normal limits will improve 05/02/2023 0525 by Aldean Baker, RN Outcome: Progressing 05/02/2023 0525 by Frances Furbish T, RN Outcome: Progressing Goal: Will remain free from infection 05/02/2023 0525 by Aldean Baker, RN Outcome: Progressing 05/02/2023 0525 by Frances Furbish T, RN Outcome: Progressing Goal: Diagnostic test results will improve 05/02/2023 0525 by Aldean Baker, RN Outcome: Progressing 05/02/2023 0525 by Frances Furbish T, RN Outcome: Progressing Goal: Respiratory complications will improve 05/02/2023 0525 by Aldean Baker, RN Outcome: Progressing 05/02/2023 0525 by Frances Furbish T, RN Outcome: Progressing Goal: Cardiovascular complication will be avoided 05/02/2023 0525 by Aldean Baker,  RN Outcome: Progressing 05/02/2023 0525 by Aldean Baker, RN Outcome: Progressing   Problem: Activity: Goal: Risk for activity intolerance will decrease 05/02/2023 0525 by Aldean Baker, RN Outcome: Progressing 05/02/2023 0525 by Aldean Baker, RN Outcome: Progressing   Problem: Nutrition: Goal: Adequate nutrition will be maintained 05/02/2023 0525 by Aldean Baker, RN Outcome: Progressing 05/02/2023 0525 by Aldean Baker, RN Outcome: Progressing  Problem: Coping: Goal: Level of anxiety will decrease 05/02/2023 0525 by Aldean Baker, RN Outcome: Progressing 05/02/2023 0525 by Aldean Baker, RN Outcome: Progressing   Problem: Elimination: Goal: Will not experience complications related to bowel motility Outcome: Progressing Goal: Will not experience complications related to urinary retention Outcome: Progressing   Problem: Pain Management: Goal: General experience of comfort will improve Outcome: Progressing   Problem: Safety: Goal: Ability to remain free from injury will improve Outcome: Progressing   Problem: Skin Integrity: Goal: Risk for impaired skin integrity will decrease Outcome: Progressing

## 2023-05-02 NOTE — Progress Notes (Signed)
12:33 AM Provider paged to advise on something for diarrhea in patient. Provider responded that no orders for antidiarrheal meds at this time given concern for GI bleed, GI following at this time  12:47 AM Provider was asked if we need something for the elevated BP of 170's/60's Provider responded with PRN with parameters.

## 2023-05-02 NOTE — Progress Notes (Signed)
PROGRESS NOTE  Diane Knight  QMV:[id number] DOB: 1941/03/24 DOA: 04/30/2023 PCP: Renford Dills, MD   Brief Narrative: Patient is 82 year old female with history of peripheral artery disease, type 2 diabetes, hypertension, hyperlipidemia, CKD stage III, iron deficiency anemia who presented with generalized weakness, lightheadedness, exertional dyspnea.  Found to have low hemoglobin on outpatient blood work.  Report of darker stool, intermittent abdominal discomfort, vomiting.  On presentation she was hemodynamically stable.  Lab work showed hemoglobin of 6.6, potassium of 3.1.  GI consulted for the suspicion of GI bleed.  Started on IV Protonix.  Given a unit of blood transfusion. S/p video capsule endoscopy  Assessment & Plan:  Principal Problem:   Symptomatic anemia Active Problems:   DM (diabetes mellitus) (HCC)   Hypertension   CKD stage 3a, GFR 45-59 ml/min (HCC)  Symptomatic anemia: Presented with hemoglobin of 6.6.  Hemoglobin was 9.6 on June 2024.  Hemodynamically stable.  FOBT negative.  Started on PPI.  Given a unit of blood transfusion.  Last hemoglobin in the range of 7,today cbc pending.  GI following,s/p video capsule endoscopy.  She says her bowel movements are not  darker like before.  She had EGD and colonoscopy on 09/26/2022, found to have 1 tubular adenoma, internal/external hemorrhoids but no source of bleeding. Continue to monitor H&H  Hypertension: Currently blood pressure up.  On Norvasc, valsartan-hydrochlorothiazide.  Will increase the dose of Norvasc.  Added labetalol as as needed.  Insulin-dependent diabetes type 2: A1c of 6.7 as per lab work on July 24.  Monitor blood sugars  CKD stage IIIa: Currently kidney function at baseline.        DVT prophylaxis:SCDs Start: 04/30/23 2245     Code Status: Full Code  Family Communication: None at the bedside  Patient status:Obs  Patient is from :home  Anticipated discharge MW:[id number]  Estimated DC date:1-2  days   Consultants: GI   Procedures:None yet  Antimicrobials:  Anti-infectives (From admission, onward)    None       Subjective: Patient seen and examined the bedside today.  Hemodynamically stable.  She was eating her breakfast.  She had a bowel movement today which was not dark.  No hematochezia.  Comfortable  Objective: Vitals:   05/02/23 0442 05/02/23 0444 05/02/23 0749 05/02/23 0915  BP: (!) 163/62  (!) 170/70 (!) 182/63  Pulse: 71  65   Resp: 17  19   Temp: 98.1 F (36.7 C)  98.1 F (36.7 C)   TempSrc:   Oral   SpO2: 97%  98%   Weight:  89.3 kg    Height:       No intake or output data in the 24 hours ending 05/02/23 1028  Filed Weights   05/01/23 0257 05/02/23 0444  Weight: 88.2 kg 89.3 kg    Examination:   General exam: Overall comfortable, not in distress,obese, pleasant elderly female HEENT: PERRL Respiratory system:  no wheezes or crackles  Cardiovascular system: S1 & S2 heard, RRR.  Gastrointestinal system: Abdomen is nondistended, soft and nontender. Central nervous system: Alert and oriented Extremities: No edema, no clubbing ,no cyanosis Skin: No rashes, no ulcers,no icterus     Data Reviewed: I have personally reviewed following labs and imaging studies  CBC: Recent Labs  Lab 04/30/23 1620 05/01/23 0020 05/01/23 0920 05/01/23 2216  WBC 6.8 6.3 6.5 7.4  HGB 6.6* 7.9* 7.8* 7.5*  HCT 25.1* 27.4* 27.8* 26.3*  MCV 63.2* 65.7* 65.3* 64.8*  PLT 449* 399 420* 395  Basic Metabolic Panel: Recent Labs  Lab 04/30/23 1620 05/01/23 0920  NA 140 141  K 3.1* 3.7  CL 103 106  CO2 25 26  GLUCOSE 168* 137*  BUN 13 9  CREATININE 1.10* 0.89  CALCIUM 8.5* 8.5*     No results found for this or any previous visit (from the past 240 hour(s)).   Radiology Studies: DG Chest 1 View  Result Date: 04/30/2023 CLINICAL DATA:  Shortness of breath. Generalized weakness for 3 days. EXAM: CHEST  1 VIEW COMPARISON:  10/23/2021 FINDINGS: Mild  cardiac enlargement. No vascular congestion, edema, or consolidation. No pleural effusions. No pneumothorax. Mediastinal contours appear intact. Calcification of the aorta. Degenerative changes in the spine and shoulders. Surgical clips in the base of the neck. IMPRESSION: Mild cardiac enlargement.  No evidence of active pulmonary disease. Electronically Signed   By: Burman Nieves M.D.   On: 04/30/2023 19:06    Scheduled Meds:  amLODipine  5 mg Oral Daily   atorvastatin  10 mg Oral Daily   ferrous sulfate  325 mg Oral Q breakfast   irbesartan  150 mg Oral Daily   And   hydrochlorothiazide  12.5 mg Oral Daily   insulin aspart  0-6 Units Subcutaneous Q4H   insulin glargine-yfgn  20 Units Subcutaneous Daily   levothyroxine  125 mcg Oral QAC breakfast   Continuous Infusions:   LOS: 0 days   Burnadette Pop, MD [facility] P11/07/2022, 10:28 AM

## 2023-05-07 DIAGNOSIS — N1832 Chronic kidney disease, stage 3b: Secondary | ICD-10-CM | POA: Diagnosis not present

## 2023-05-07 DIAGNOSIS — E1165 Type 2 diabetes mellitus with hyperglycemia: Secondary | ICD-10-CM | POA: Diagnosis not present

## 2023-05-07 DIAGNOSIS — D649 Anemia, unspecified: Secondary | ICD-10-CM | POA: Diagnosis not present

## 2023-05-07 DIAGNOSIS — E11319 Type 2 diabetes mellitus with unspecified diabetic retinopathy without macular edema: Secondary | ICD-10-CM | POA: Diagnosis not present

## 2023-05-07 DIAGNOSIS — D509 Iron deficiency anemia, unspecified: Secondary | ICD-10-CM | POA: Diagnosis not present

## 2023-05-07 DIAGNOSIS — E7800 Pure hypercholesterolemia, unspecified: Secondary | ICD-10-CM | POA: Diagnosis not present

## 2023-05-07 DIAGNOSIS — Z794 Long term (current) use of insulin: Secondary | ICD-10-CM | POA: Diagnosis not present

## 2023-05-07 DIAGNOSIS — I700 Atherosclerosis of aorta: Secondary | ICD-10-CM | POA: Diagnosis not present

## 2023-05-07 DIAGNOSIS — E1122 Type 2 diabetes mellitus with diabetic chronic kidney disease: Secondary | ICD-10-CM | POA: Diagnosis not present

## 2023-05-15 ENCOUNTER — Inpatient Hospital Stay: Payer: Medicare PPO

## 2023-05-15 ENCOUNTER — Inpatient Hospital Stay: Payer: Medicare PPO | Attending: Hematology | Admitting: Hematology

## 2023-05-15 VITALS — BP 153/68 | HR 70 | Temp 97.3°F | Resp 18 | Wt 194.1 lb

## 2023-05-15 DIAGNOSIS — D500 Iron deficiency anemia secondary to blood loss (chronic): Secondary | ICD-10-CM

## 2023-05-15 DIAGNOSIS — K922 Gastrointestinal hemorrhage, unspecified: Secondary | ICD-10-CM | POA: Insufficient documentation

## 2023-05-15 DIAGNOSIS — I10 Essential (primary) hypertension: Secondary | ICD-10-CM | POA: Insufficient documentation

## 2023-05-15 DIAGNOSIS — D509 Iron deficiency anemia, unspecified: Secondary | ICD-10-CM | POA: Insufficient documentation

## 2023-05-15 LAB — CMP (CANCER CENTER ONLY)
ALT: 8 U/L (ref 0–44)
AST: 11 U/L — ABNORMAL LOW (ref 15–41)
Albumin: 4.2 g/dL (ref 3.5–5.0)
Alkaline Phosphatase: 90 U/L (ref 38–126)
Anion gap: 8 (ref 5–15)
BUN: 13 mg/dL (ref 8–23)
CO2: 30 mmol/L (ref 22–32)
Calcium: 9 mg/dL (ref 8.9–10.3)
Chloride: 106 mmol/L (ref 98–111)
Creatinine: 0.96 mg/dL (ref 0.44–1.00)
GFR, Estimated: 59 mL/min — ABNORMAL LOW (ref >60)
Glucose, Bld: 136 mg/dL — ABNORMAL HIGH (ref 70–99)
Potassium: 3.4 mmol/L — ABNORMAL LOW (ref 3.5–5.1)
Sodium: 144 mmol/L (ref 135–145)
Total Bilirubin: 0.6 mg/dL (ref <1.2)
Total Protein: 8.3 g/dL — ABNORMAL HIGH (ref 6.5–8.1)

## 2023-05-15 LAB — IRON AND IRON BINDING CAPACITY (CC-WL,HP ONLY)
Iron: 353 ug/dL — ABNORMAL HIGH (ref 28–170)
Saturation Ratios: 88 % — ABNORMAL HIGH (ref 10.4–31.8)
TIBC: 402 ug/dL (ref 250–450)
UIBC: 49 ug/dL — ABNORMAL LOW (ref 148–442)

## 2023-05-15 LAB — CBC WITH DIFFERENTIAL (CANCER CENTER ONLY)
Abs Immature Granulocytes: 0.02 10*3/uL (ref 0.00–0.07)
Basophils Absolute: 0.1 10*3/uL (ref 0.0–0.1)
Basophils Relative: 1 %
Eosinophils Absolute: 0.1 10*3/uL (ref 0.0–0.5)
Eosinophils Relative: 2 %
HCT: 32.9 % — ABNORMAL LOW (ref 36.0–46.0)
Hemoglobin: 9.7 g/dL — ABNORMAL LOW (ref 12.0–15.0)
Immature Granulocytes: 0 %
Lymphocytes Relative: 28 %
Lymphs Abs: 2 10*3/uL (ref 0.7–4.0)
MCH: 19.9 pg — ABNORMAL LOW (ref 26.0–34.0)
MCHC: 29.5 g/dL — ABNORMAL LOW (ref 30.0–36.0)
MCV: 67.4 fL — ABNORMAL LOW (ref 80.0–100.0)
Monocytes Absolute: 0.4 10*3/uL (ref 0.1–1.0)
Monocytes Relative: 5 %
Neutro Abs: 4.4 10*3/uL (ref 1.7–7.7)
Neutrophils Relative %: 64 %
Platelet Count: 466 10*3/uL — ABNORMAL HIGH (ref 150–400)
RBC: 4.88 MIL/uL (ref 3.87–5.11)
RDW: 30.4 % — ABNORMAL HIGH (ref 11.5–15.5)
WBC Count: 6.9 10*3/uL (ref 4.0–10.5)
nRBC: 0 % (ref 0.0–0.2)

## 2023-05-15 LAB — SAMPLE TO BLOOD BANK

## 2023-05-15 LAB — T4, FREE: Free T4: 1.06 ng/dL (ref 0.61–1.12)

## 2023-05-15 LAB — TSH: TSH: 2.78 u[IU]/mL (ref 0.350–4.500)

## 2023-05-15 LAB — LACTATE DEHYDROGENASE: LDH: 120 U/L (ref 98–192)

## 2023-05-15 LAB — FERRITIN: Ferritin: 8 ng/mL — ABNORMAL LOW (ref 11–307)

## 2023-05-15 NOTE — Progress Notes (Signed)
HEMATOLOGY/ONCOLOGY CONSULTATION NOTE  Date of Service: 05/15/2023  Patient Care Team: Renford Dills, MD as PCP - General (Internal Medicine)  CHIEF COMPLAINTS/PURPOSE OF CONSULTATION:  evaluation and management of symptomatic anemia.   HISTORY OF PRESENTING ILLNESS:  Diane Knight is a wonderful 82 y.o. female who has been referred to [place] by Renford Dills, MD for evaluation and management of symptomatic anemia.   She presented to the ED on 04/30/2023 and reported lightheadedness, generalized weakness, and exertional dyspnea, stable dark stools, intermittent abdominal discomfort, and one episode of vomiting. Her hgb was found to be 6.6. Patient was treated with IV Protonix, 40 mEq oral potassium, and 1 unit RBC transfusion.   Today she reports she has been having black stools for a while. She continues to have black stools at this time daily. Patient reports that her stools are tar-colored. She does take Tylenol, but denies taking any NSAID medications such as ibuprofen, Aleve, or Motrin.   She reports some tenderness in her lower abdomen and denies having any follow-up with gastroenterology.   There have been findings of small ulcers in the stomach and dudenum. Patient is not taking acid reflux medication. Patient reports that she did not know she had ulcers.   She reports that she has had anemia since this year. Patient has never required blood transfusions prior to this year. She reports a history of two blood transfusions this year. She has been taking iron pills since presenting to the hospital recently.  She reports a fhx of thalassemia and sickle cell disease. Patient reports that her sister with sickle cell disease has passed away.   Patient reports consistent coldness in her feet, which she has been told was due to neuropathy from her DM. Patient reports receiving a couple injections in her back and arm.   She complains of leg cramping pain. Patient reports that her legs  and hands are sometimes numb.   Patient has been on thyroid replacement for a couple of years. She takes 3 tablets calcium carbonate every morning with her thyroid medication.   She reports some abdominal soreness attributed to Trulicity, which she uses twice a day.   She has not lost weight over the last 6 months. No other changes in bowel habits, changes in breathing, new bone pains, fever, chills, or night sweats. She complains of pain in her shoulder and neck.   Patient reports that she currently lives on her own. She reports that 3 of her 6 children have passed away. Patient reports that one of her sons passed away from cancer three years ago. She reports that she recently lost her daughter a year ago in November. Patient reports that she needs some additional help at home with cooking and cleaning. Patient has a sister with DM living in [place]. She reports that one of her children does live in the area.   She reports that her PCP is Dr. Nehemiah Settle and she most recently saw him last week.   MEDICAL HISTORY:  Past Medical History:  Diagnosis Date   Cataract    Diabetes mellitus    TYPE 2    Fibromyalgia    Goiter    Hyperlipidemia    Hypertension    Neuropathy    Tobacco dependence     SURGICAL HISTORY: Past Surgical History:  Procedure Laterality Date   ABDOMINAL HYSTERECTOMY     EYE SURGERY     GIVENS CAPSULE STUDY N/A 05/01/2023   Procedure: GIVENS CAPSULE STUDY;  Surgeon: Kerin Salen, MD;  Location: [facility] ENDOSCOPY;  Service: Gastroenterology;  Laterality: N/A;   THYROIDECTOMY N/A 08/04/2017   Procedure: TOTAL THYROIDECTOMY;  Surgeon: Darnell Level, MD;  Location: [facility] ORS;  Service: General;  Laterality: N/A;    SOCIAL HISTORY: Social History   Socioeconomic History   Marital status: Widowed    Spouse name: Not on file   Number of children: 6   Years of education: 10th   Highest education level: Not on file  Occupational History    Employer: RETIRED    Comment: retired   Tobacco Use   Smoking status: Former    Current packs/day: 0.30    Types: Cigarettes   Smokeless tobacco: Never  Substance and Sexual Activity   Alcohol use: No   Drug use: No   Sexual activity: Never  Other Topics Concern   Not on file  Social History Narrative   Patient lives at home alone widowed.   Retired   One cup of coffee daily.   Right handed.   10th grade education.   Social Determinants of Health   Financial Resource Strain: Not on file  Food Insecurity: No Food Insecurity (05/01/2023)   Hunger Vital Sign    Worried About Running Out of Food in the Last Year: Never true    Ran Out of Food in the Last Year: Never true  Transportation Needs: No Transportation Needs (05/01/2023)   PRAPARE - Administrator, Civil Service (Medical): No    Lack of Transportation (Non-Medical): No  Physical Activity: Not on file  Stress: Not on file  Social Connections: Not on file  Intimate Partner Violence: Not At Risk (05/01/2023)   Humiliation, Afraid, Rape, and Kick questionnaire    Fear of Current or Ex-Partner: No    Emotionally Abused: No    Physically Abused: No    Sexually Abused: No    FAMILY HISTORY: Family History  Problem Relation Age of Onset   Diabetes Mother    High blood pressure Father     ALLERGIES:  is allergic to metformin and related.  MEDICATIONS:  Current Outpatient Medications  Medication Sig Dispense Refill   acetaminophen (TYLENOL) 500 MG tablet Take 500 mg by mouth every 6 (six) hours as needed for mild pain or headache.      amLODipine (NORVASC) 10 MG tablet Take 1 tablet (10 mg total) by mouth daily. 30 tablet 1   atorvastatin (LIPITOR) 10 MG tablet Take 10 mg by mouth daily.     calcium carbonate (TUMS) 500 MG chewable tablet Chew 3 tablets (600 mg of elemental calcium total) by mouth 4 (four) times daily -  before meals and at bedtime. (Patient not taking: Reported on 04/30/2023) 150 tablet 1   ferrous sulfate 325 (65 FE) MG  tablet Take 1 tablet (325 mg total) by mouth daily with breakfast. 30 tablet 1   HUMALOG MIX 75/25 KWIKPEN (75-25) 100 UNIT/ML KwikPen Inject 50-55 Units into the skin See admin instructions. Inject 55 units subcutaneous in the morning and then inject 50 units subcutaneous in the evening     levothyroxine (SYNTHROID) 125 MCG tablet Take 125 mcg by mouth daily before breakfast.     lidocaine (HM LIDOCAINE PATCH) 4 % Place 1 patch onto the skin daily. (Patient not taking: Reported on 04/30/2023) 14 patch 0   metFORMIN (GLUCOPHAGE-XR) 500 MG 24 hr tablet Take 500 mg by mouth every evening.     pantoprazole (PROTONIX) 40 MG tablet Take 1 tablet (  40 mg total) by mouth daily. 60 tablet 0   TRULICITY 1.5 MG/0.5ML SOPN Inject 1.5 mg into the skin once a week.     valsartan-hydrochlorothiazide (DIOVAN-HCT) 160-12.5 MG per tablet Take 1 tablet by mouth daily.     No current facility-administered medications for this visit.    REVIEW OF SYSTEMS:    10 Point review of Systems was done is negative except as noted above.  PHYSICAL EXAMINATION: ECOG PERFORMANCE STATUS: {CHL ONC ECOG OZ:[id number]}  .There were no vitals filed for this visit. There were no vitals filed for this visit. .There is no height or weight on file to calculate BMI.  GENERAL:alert, in no acute distress and comfortable SKIN: no acute rashes, no significant lesions EYES: conjunctiva are pink and non-injected, sclera anicteric OROPHARYNX: MMM, no exudates, no oropharyngeal erythema or ulceration NECK: supple, no JVD LYMPH:  no palpable lymphadenopathy in the cervical, axillary or inguinal regions LUNGS: clear to auscultation b/l with normal respiratory effort HEART: regular rate & rhythm ABDOMEN:  normoactive bowel sounds , non tender, not distended. Extremity: no pedal edema PSYCH: alert & oriented x 3 with fluent speech NEURO: no focal motor/sensory deficits  LABORATORY DATA:  I have reviewed the data as listed  .     Latest Ref Rng & Units 05/02/2023   10:50 AM 05/01/2023   10:16 PM 05/01/2023    9:20 AM  CBC  WBC 4.0 - 10.5 K/uL 7.0  7.4  6.5   Hemoglobin 12.0 - 15.0 g/dL 7.9  7.5  7.8   Hematocrit 36.0 - 46.0 % 28.2  26.3  27.8   Platelets 150 - 400 K/uL 406  395  420     .    Latest Ref Rng & Units 05/01/2023    9:20 AM 04/30/2023    4:20 PM 12/08/2022    1:20 PM  CMP  Glucose 70 - 99 mg/dL 629  528  413   BUN 8 - 23 mg/dL 9  13  20    Creatinine 0.44 - 1.00 mg/dL 2.44  0.10  2.72   Sodium 135 - 145 mmol/L 141  140  140   Potassium 3.5 - 5.1 mmol/L 3.7  3.1  3.6   Chloride 98 - 111 mmol/L 106  103  107   CO2 22 - 32 mmol/L 26  25  24    Calcium 8.9 - 10.3 mg/dL 8.5  8.5  8.5   Total Protein 6.5 - 8.1 g/dL  7.4  7.3   Total Bilirubin 0.3 - 1.2 mg/dL  0.5  0.7   Alkaline Phos 38 - 126 U/L  81  98   AST 15 - 41 U/L  14  19   ALT 0 - 44 U/L  10  20      RADIOGRAPHIC STUDIES: I have personally reviewed the radiological images as listed and agreed with the findings in the report. DG Chest 1 View  Result Date: 04/30/2023 CLINICAL DATA:  Shortness of breath. Generalized weakness for 3 days. EXAM: CHEST  1 VIEW COMPARISON:  10/23/2021 FINDINGS: Mild cardiac enlargement. No vascular congestion, edema, or consolidation. No pleural effusions. No pneumothorax. Mediastinal contours appear intact. Calcification of the aorta. Degenerative changes in the spine and shoulders. Surgical clips in the base of the neck. IMPRESSION: Mild cardiac enlargement.  No evidence of active pulmonary disease. Electronically Signed   By: Burman Nieves M.D.   On: 04/30/2023 19:06    ASSESSMENT & PLAN:  82 y.o. female with:  symptomatic anemia  PLAN:  -labs from 05/02/2023 shows hgb low at 7.9, borderline platelet elevation at 406K, and WBC normal at 7.0K -patient was previously iron deficient 10 months ago -Her anemia is microcytic. Her MCV was previously normal in June, which may have been due to transfusion. Her  MCV has been low since. -recommend patient to continue acid suppressants given findings of ulcers -advised patient to take her thyroid pills by itself and not with calcium due to affects on absorption -discussed option of assisted living for additional support -answered all of patient's questions in detail -will order blood tests to evaluate why her RBCs are low. This could be due to iron deficiency, hemoglobinopathy, or other factor.   FOLLOW-UP: Labs today Phone visit with Dr Candise Che in 1 week  The total time spent in the appointment was *** minutes* .  All of the patient's questions were answered with apparent satisfaction. The patient knows to call the clinic with any problems, questions or concerns.   Wyvonnia Lora MD MS AAHIVMS [facility] Hematology/Oncology Physician [facility]  .*Total Encounter Time as defined by the Centers for Medicare and Medicaid Services includes, in addition to the face-to-face time of a patient visit (documented in the note above) non-face-to-face time: obtaining and reviewing outside history, ordering and reviewing medications, tests or procedures, care coordination (communications with other health care professionals or caregivers) and documentation in the medical record.    I,Mitra Faeizi,acting as a Neurosurgeon for Wyvonnia Lora, MD.,have documented all relevant documentation on the behalf of Wyvonnia Lora, MD,as directed by  Wyvonnia Lora, MD while in the presence of Wyvonnia Lora, MD.  ***

## 2023-05-16 LAB — KAPPA/LAMBDA LIGHT CHAINS
Kappa free light chain: 53.2 mg/L — ABNORMAL HIGH (ref 3.3–19.4)
Kappa, lambda light chain ratio: 1.79 — ABNORMAL HIGH (ref 0.26–1.65)
Lambda free light chains: 29.8 mg/L — ABNORMAL HIGH (ref 5.7–26.3)

## 2023-05-16 LAB — HAPTOGLOBIN: Haptoglobin: 176 mg/dL (ref 41–333)

## 2023-05-19 LAB — MULTIPLE MYELOMA PANEL, SERUM
Albumin SerPl Elph-Mcnc: 3.8 g/dL (ref 2.9–4.4)
Albumin/Glob SerPl: 1 (ref 0.7–1.7)
Alpha 1: 0.2 g/dL (ref 0.0–0.4)
Alpha2 Glob SerPl Elph-Mcnc: 0.7 g/dL (ref 0.4–1.0)
B-Globulin SerPl Elph-Mcnc: 1.8 g/dL — ABNORMAL HIGH (ref 0.7–1.3)
Gamma Glob SerPl Elph-Mcnc: 1.2 g/dL (ref 0.4–1.8)
Globulin, Total: 4.1 g/dL — ABNORMAL HIGH (ref 2.2–3.9)
IgA: 300 mg/dL (ref 64–422)
IgG (Immunoglobin G), Serum: 1796 mg/dL — ABNORMAL HIGH (ref 586–1602)
IgM (Immunoglobulin M), Srm: 92 mg/dL (ref 26–217)
Total Protein ELP: 7.9 g/dL (ref 6.0–8.5)

## 2023-05-20 LAB — HGB FRACTIONATION CASCADE
Hgb A2: 2 % (ref 1.8–3.2)
Hgb A: 98 % (ref 96.4–98.8)
Hgb F: 0 % (ref 0.0–2.0)
Hgb S: 0 %

## 2023-05-21 ENCOUNTER — Encounter: Payer: Self-pay | Admitting: Hematology

## 2023-05-21 DIAGNOSIS — D509 Iron deficiency anemia, unspecified: Secondary | ICD-10-CM | POA: Insufficient documentation

## 2023-05-30 LAB — ALPHA-THALASSEMIA GENOTYPR

## 2023-06-11 DIAGNOSIS — R109 Unspecified abdominal pain: Secondary | ICD-10-CM | POA: Diagnosis not present

## 2023-06-11 DIAGNOSIS — D649 Anemia, unspecified: Secondary | ICD-10-CM | POA: Diagnosis not present

## 2023-08-19 DIAGNOSIS — N1832 Chronic kidney disease, stage 3b: Secondary | ICD-10-CM | POA: Diagnosis not present

## 2023-08-19 DIAGNOSIS — I700 Atherosclerosis of aorta: Secondary | ICD-10-CM | POA: Diagnosis not present

## 2023-08-19 DIAGNOSIS — E1165 Type 2 diabetes mellitus with hyperglycemia: Secondary | ICD-10-CM | POA: Diagnosis not present

## 2023-08-19 DIAGNOSIS — E7800 Pure hypercholesterolemia, unspecified: Secondary | ICD-10-CM | POA: Diagnosis not present

## 2023-08-19 DIAGNOSIS — E1122 Type 2 diabetes mellitus with diabetic chronic kidney disease: Secondary | ICD-10-CM | POA: Diagnosis not present

## 2023-08-19 DIAGNOSIS — L989 Disorder of the skin and subcutaneous tissue, unspecified: Secondary | ICD-10-CM | POA: Diagnosis not present

## 2023-08-19 DIAGNOSIS — E11319 Type 2 diabetes mellitus with unspecified diabetic retinopathy without macular edema: Secondary | ICD-10-CM | POA: Diagnosis not present

## 2023-08-19 DIAGNOSIS — D509 Iron deficiency anemia, unspecified: Secondary | ICD-10-CM | POA: Diagnosis not present

## 2023-08-27 DIAGNOSIS — L821 Other seborrheic keratosis: Secondary | ICD-10-CM | POA: Diagnosis not present

## 2023-08-27 DIAGNOSIS — D492 Neoplasm of unspecified behavior of bone, soft tissue, and skin: Secondary | ICD-10-CM | POA: Diagnosis not present

## 2023-08-27 DIAGNOSIS — L820 Inflamed seborrheic keratosis: Secondary | ICD-10-CM | POA: Diagnosis not present

## 2023-09-10 DIAGNOSIS — H04123 Dry eye syndrome of bilateral lacrimal glands: Secondary | ICD-10-CM | POA: Diagnosis not present

## 2023-09-10 DIAGNOSIS — H1045 Other chronic allergic conjunctivitis: Secondary | ICD-10-CM | POA: Diagnosis not present

## 2023-09-10 DIAGNOSIS — H0102A Squamous blepharitis right eye, upper and lower eyelids: Secondary | ICD-10-CM | POA: Diagnosis not present

## 2023-09-10 DIAGNOSIS — H0102B Squamous blepharitis left eye, upper and lower eyelids: Secondary | ICD-10-CM | POA: Diagnosis not present

## 2023-09-10 DIAGNOSIS — H40053 Ocular hypertension, bilateral: Secondary | ICD-10-CM | POA: Diagnosis not present

## 2023-09-10 DIAGNOSIS — E119 Type 2 diabetes mellitus without complications: Secondary | ICD-10-CM | POA: Diagnosis not present

## 2023-09-10 DIAGNOSIS — H35372 Puckering of macula, left eye: Secondary | ICD-10-CM | POA: Diagnosis not present

## 2023-09-10 DIAGNOSIS — Z961 Presence of intraocular lens: Secondary | ICD-10-CM | POA: Diagnosis not present

## 2023-09-10 DIAGNOSIS — H35363 Drusen (degenerative) of macula, bilateral: Secondary | ICD-10-CM | POA: Diagnosis not present

## 2023-09-15 DIAGNOSIS — M545 Low back pain, unspecified: Secondary | ICD-10-CM | POA: Diagnosis not present

## 2023-11-08 IMAGING — DX DG LUMBAR SPINE 2-3V
2 series · 2 of 2 positions shown · non-contrast
Comparison: Plain films lumbar spine 02/17/2018.

CLINICAL DATA: Low back pain since a fall 2 months ago. Initial
encounter.

EXAM:
LUMBAR SPINE - 2-3 VIEW

[view not recorded (1 of 2)]
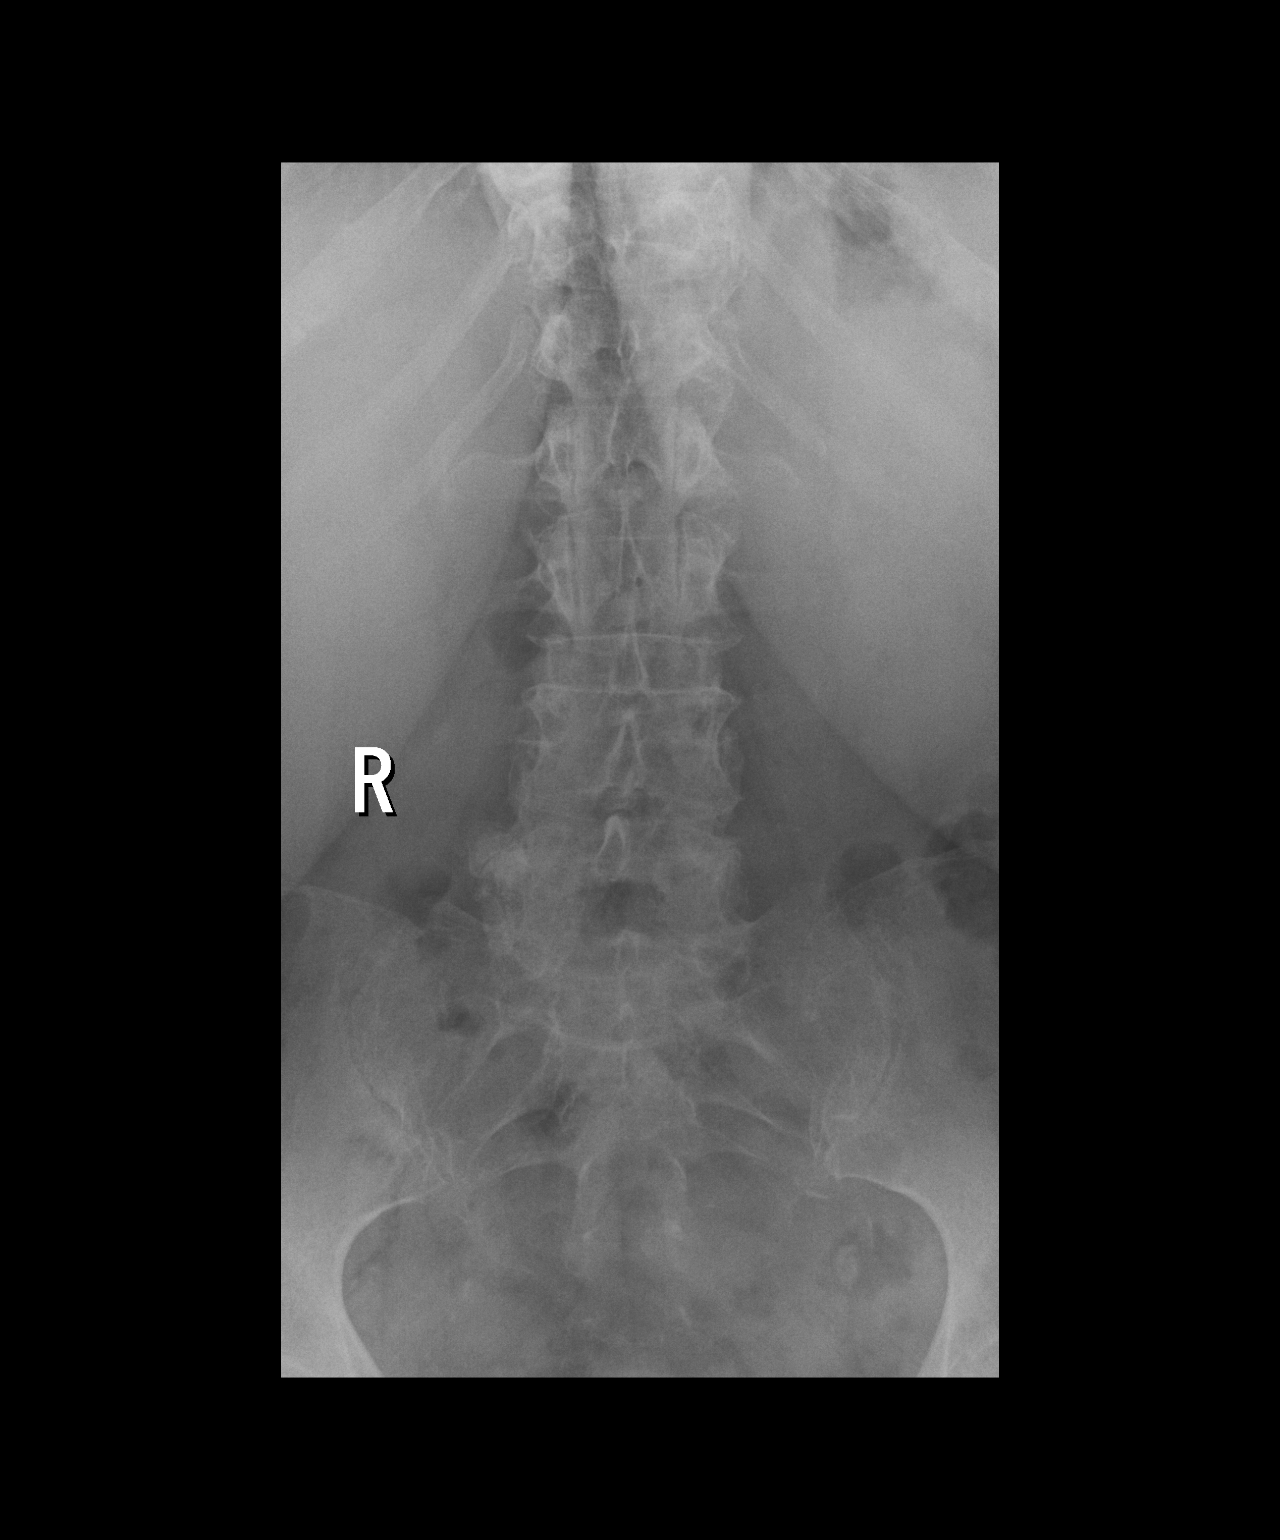

[view not recorded (2 of 2)]
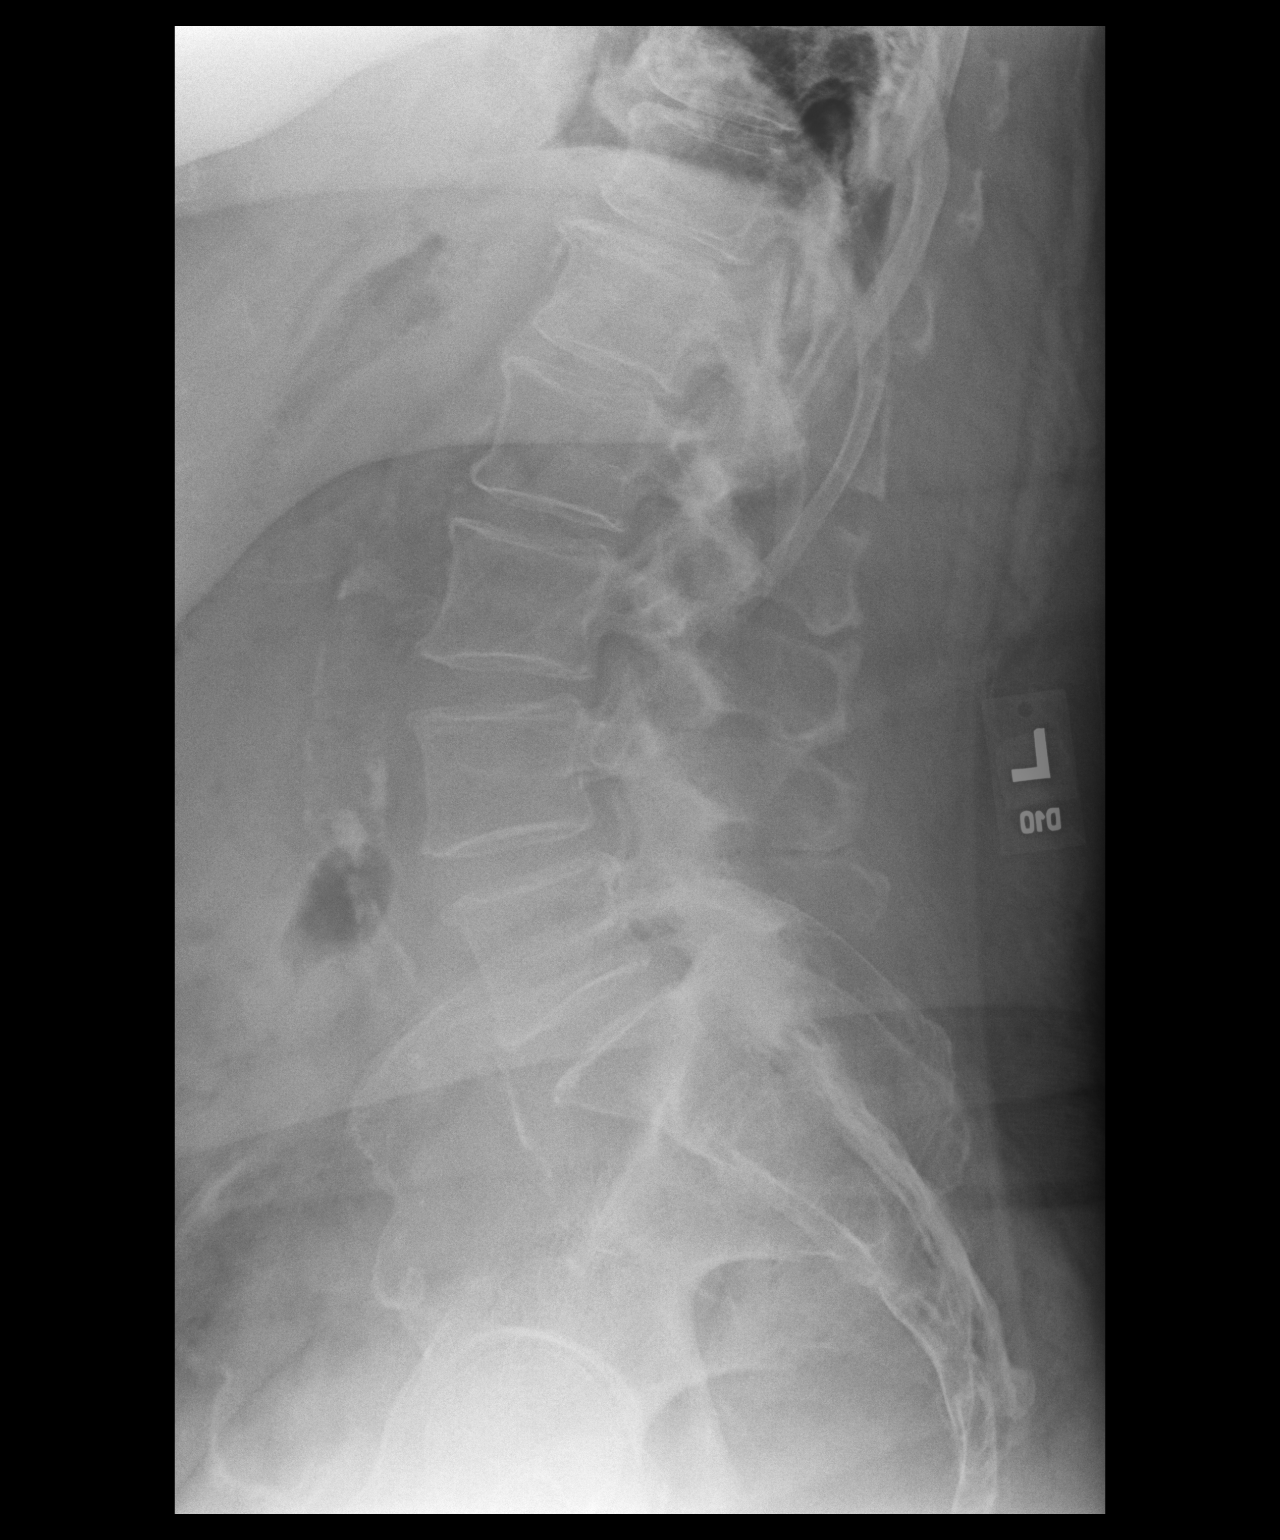

[2 of 2 positions shown; findings below may reference images not displayed]

FINDINGS: Vertebral body height and alignment are maintained. Intervertebral
disc space height is also maintained. Scattered facet degenerative
disease appears worst at L4-5 and L5-S1. Aortic atherosclerosis
noted.
IMPRESSION: No acute finding.

Lower lumbar facet arthropathy.

Aortic Atherosclerosis (ZAQCH-5PD.D).

## 2023-11-08 IMAGING — DX DG RIBS W/ CHEST 3+V*R*
3 series · 3 of 3 positions shown · non-contrast
Comparison: PA and lateral chest 04/25/2018.

CLINICAL DATA: Posterior right rib pain for 2 months since a fall.

EXAM:
RIGHT RIBS AND CHEST - 3+ VIEW

[dg ribs unilateral w/chest right (1 of 3)]
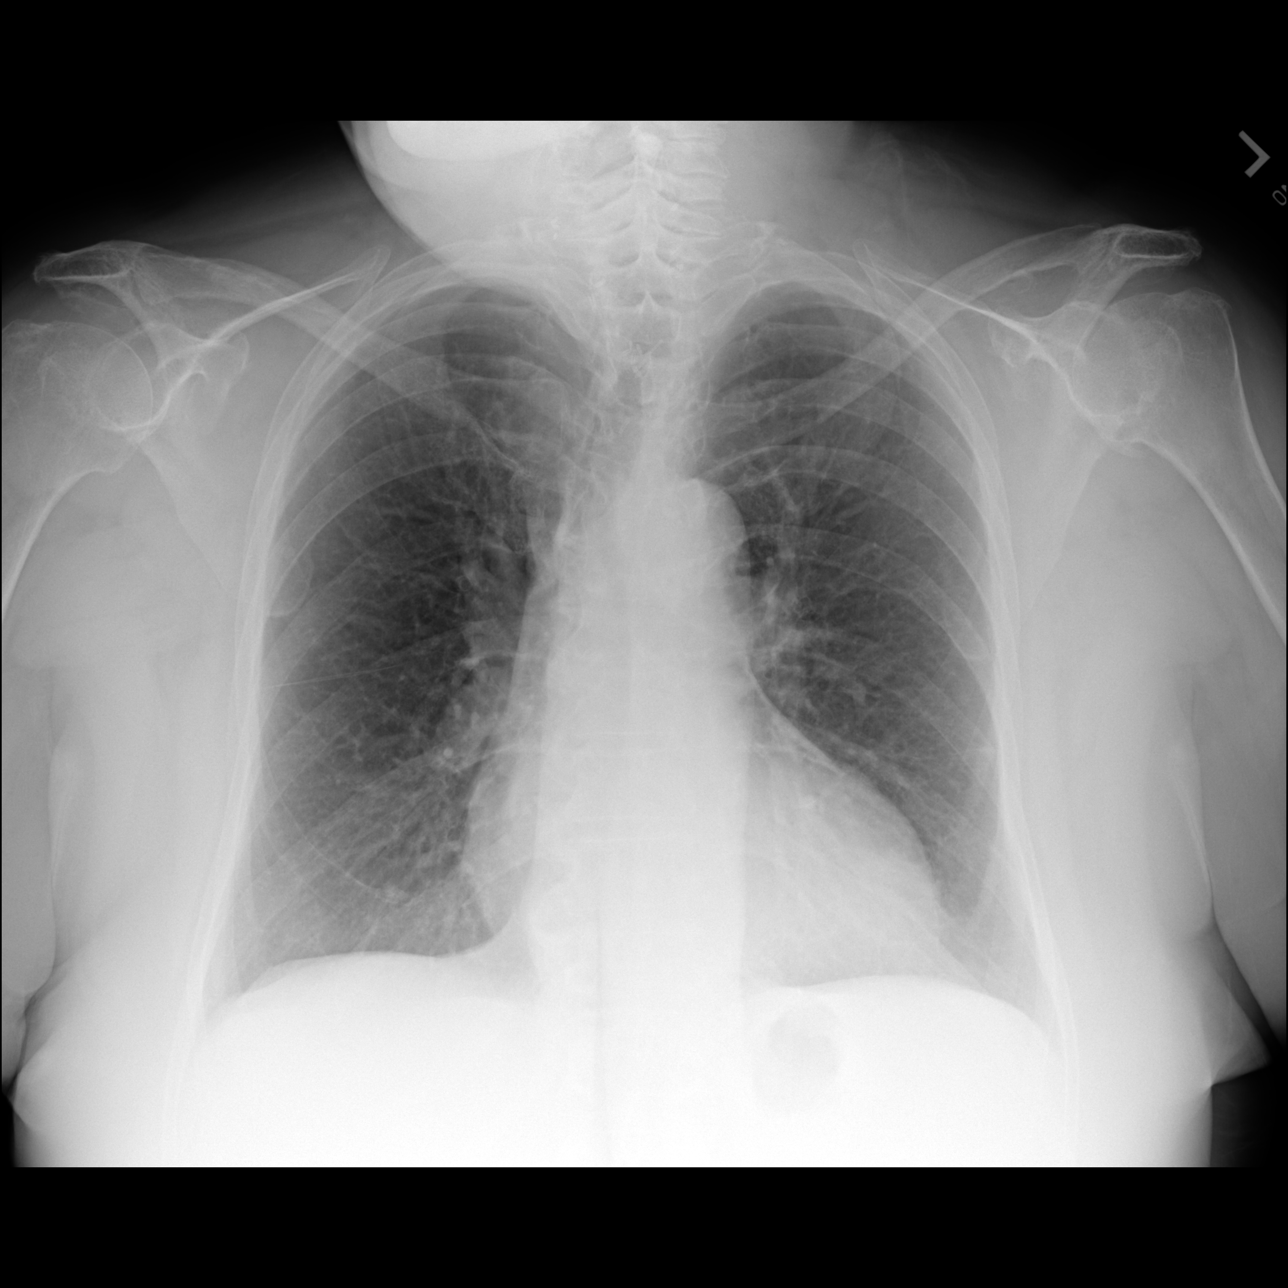

[dg ribs unilateral w/chest right (2 of 3)]
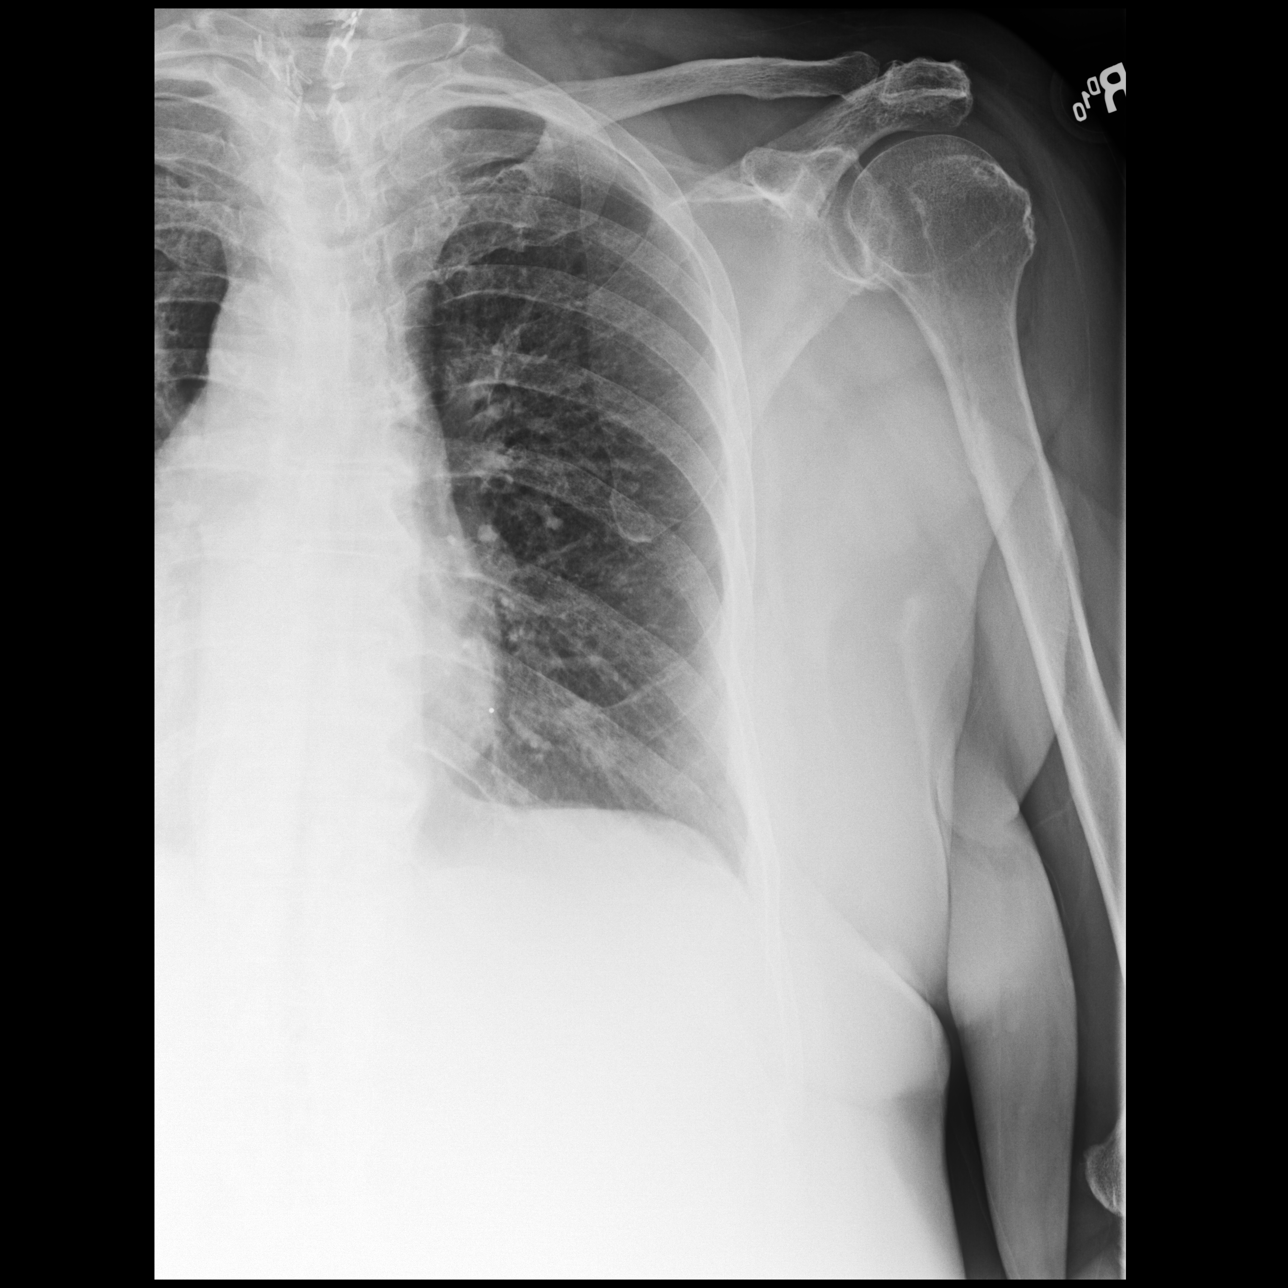

[dg ribs unilateral w/chest right (3 of 3)]
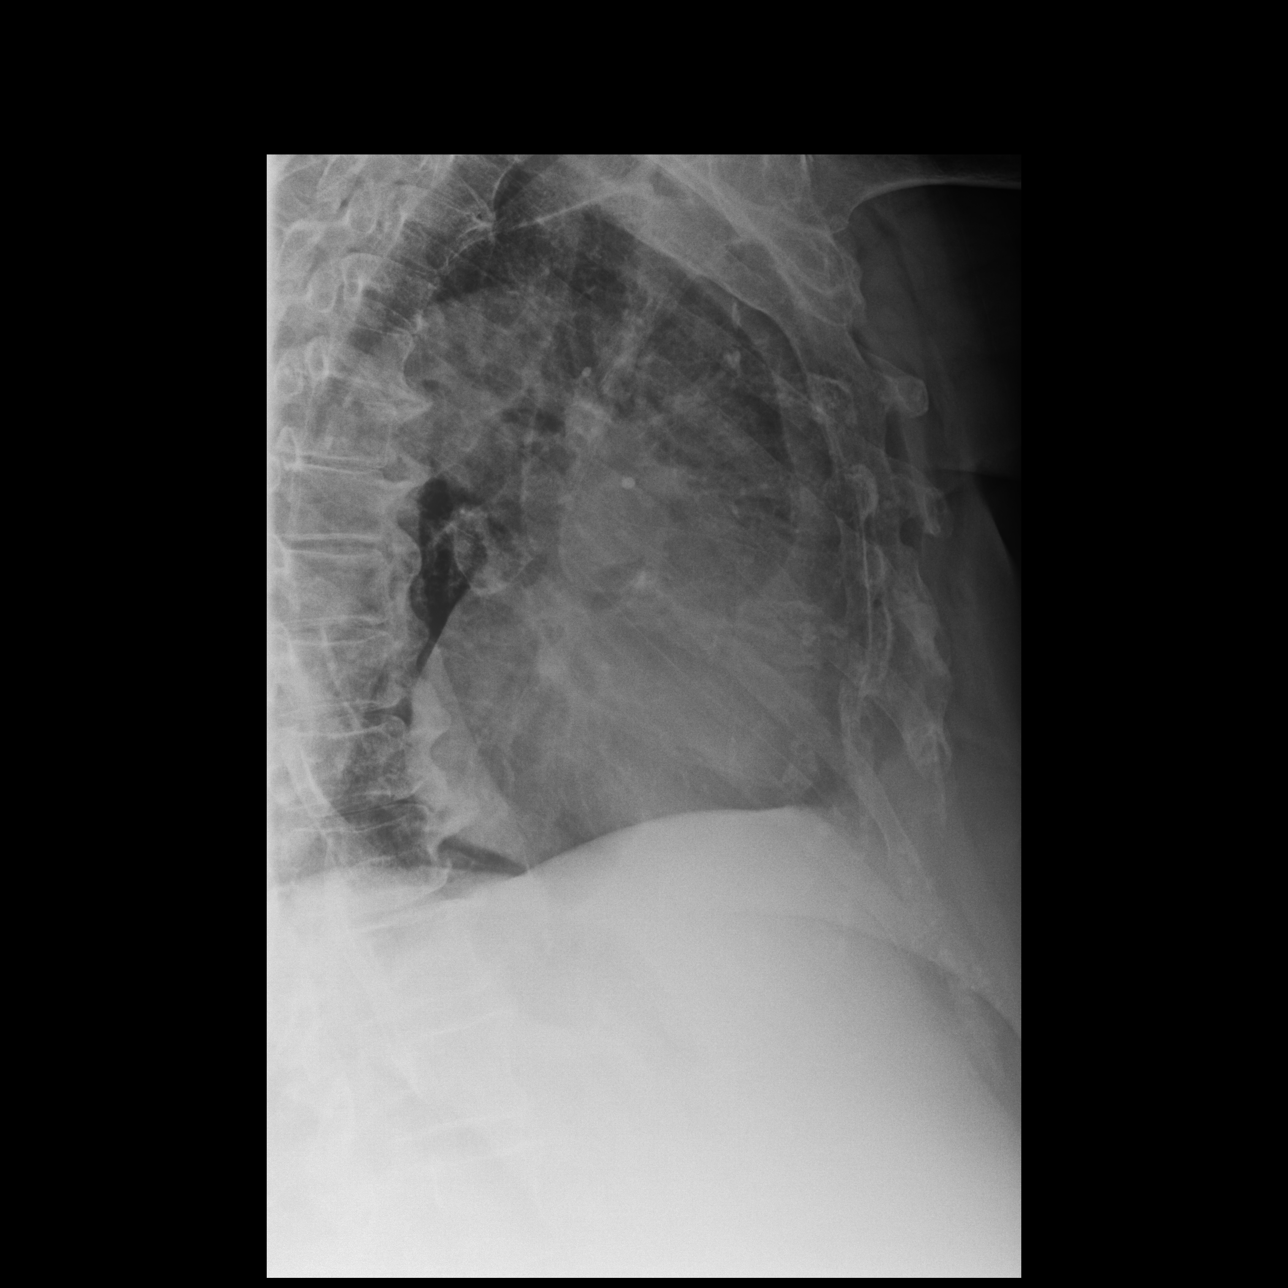

[3 of 3 positions shown; findings below may reference images not displayed]

FINDINGS: The lungs are clear. Heart size is upper normal. No pneumothorax or
pleural fluid. No fracture or focal bony abnormality is identified.
IMPRESSION: Negative for fracture or acute cardiopulmonary disease.

## 2023-11-20 DIAGNOSIS — I700 Atherosclerosis of aorta: Secondary | ICD-10-CM | POA: Diagnosis not present

## 2023-11-20 DIAGNOSIS — N1832 Chronic kidney disease, stage 3b: Secondary | ICD-10-CM | POA: Diagnosis not present

## 2023-11-20 DIAGNOSIS — I739 Peripheral vascular disease, unspecified: Secondary | ICD-10-CM | POA: Diagnosis not present

## 2023-11-20 DIAGNOSIS — E1165 Type 2 diabetes mellitus with hyperglycemia: Secondary | ICD-10-CM | POA: Diagnosis not present

## 2023-11-20 DIAGNOSIS — E1122 Type 2 diabetes mellitus with diabetic chronic kidney disease: Secondary | ICD-10-CM | POA: Diagnosis not present

## 2023-11-20 DIAGNOSIS — D649 Anemia, unspecified: Secondary | ICD-10-CM | POA: Diagnosis not present

## 2023-11-20 DIAGNOSIS — M545 Low back pain, unspecified: Secondary | ICD-10-CM | POA: Diagnosis not present

## 2023-11-20 DIAGNOSIS — E7800 Pure hypercholesterolemia, unspecified: Secondary | ICD-10-CM | POA: Diagnosis not present

## 2023-12-10 DIAGNOSIS — N1832 Chronic kidney disease, stage 3b: Secondary | ICD-10-CM | POA: Diagnosis not present

## 2023-12-10 DIAGNOSIS — E1122 Type 2 diabetes mellitus with diabetic chronic kidney disease: Secondary | ICD-10-CM | POA: Diagnosis not present

## 2023-12-10 DIAGNOSIS — E1142 Type 2 diabetes mellitus with diabetic polyneuropathy: Secondary | ICD-10-CM | POA: Diagnosis not present

## 2023-12-10 DIAGNOSIS — I10 Essential (primary) hypertension: Secondary | ICD-10-CM | POA: Diagnosis not present

## 2023-12-29 DIAGNOSIS — E1165 Type 2 diabetes mellitus with hyperglycemia: Secondary | ICD-10-CM | POA: Diagnosis not present

## 2023-12-29 DIAGNOSIS — N1832 Chronic kidney disease, stage 3b: Secondary | ICD-10-CM | POA: Diagnosis not present

## 2023-12-29 DIAGNOSIS — I10 Essential (primary) hypertension: Secondary | ICD-10-CM | POA: Diagnosis not present

## 2023-12-29 DIAGNOSIS — E1122 Type 2 diabetes mellitus with diabetic chronic kidney disease: Secondary | ICD-10-CM | POA: Diagnosis not present

## 2023-12-30 DIAGNOSIS — E1142 Type 2 diabetes mellitus with diabetic polyneuropathy: Secondary | ICD-10-CM | POA: Diagnosis not present

## 2023-12-30 DIAGNOSIS — M545 Low back pain, unspecified: Secondary | ICD-10-CM | POA: Diagnosis not present

## 2023-12-30 DIAGNOSIS — E039 Hypothyroidism, unspecified: Secondary | ICD-10-CM | POA: Diagnosis not present

## 2024-01-29 DIAGNOSIS — I10 Essential (primary) hypertension: Secondary | ICD-10-CM | POA: Diagnosis not present

## 2024-01-29 DIAGNOSIS — N1832 Chronic kidney disease, stage 3b: Secondary | ICD-10-CM | POA: Diagnosis not present

## 2024-01-29 DIAGNOSIS — E1165 Type 2 diabetes mellitus with hyperglycemia: Secondary | ICD-10-CM | POA: Diagnosis not present

## 2024-01-29 DIAGNOSIS — E1122 Type 2 diabetes mellitus with diabetic chronic kidney disease: Secondary | ICD-10-CM | POA: Diagnosis not present

## 2024-01-29 DIAGNOSIS — E1142 Type 2 diabetes mellitus with diabetic polyneuropathy: Secondary | ICD-10-CM | POA: Diagnosis not present

## 2024-02-03 DIAGNOSIS — E1122 Type 2 diabetes mellitus with diabetic chronic kidney disease: Secondary | ICD-10-CM | POA: Diagnosis not present

## 2024-02-03 DIAGNOSIS — F3341 Major depressive disorder, recurrent, in partial remission: Secondary | ICD-10-CM | POA: Diagnosis not present

## 2024-02-03 DIAGNOSIS — I700 Atherosclerosis of aorta: Secondary | ICD-10-CM | POA: Diagnosis not present

## 2024-02-03 DIAGNOSIS — R600 Localized edema: Secondary | ICD-10-CM | POA: Diagnosis not present

## 2024-02-03 DIAGNOSIS — N1832 Chronic kidney disease, stage 3b: Secondary | ICD-10-CM | POA: Diagnosis not present

## 2024-02-03 DIAGNOSIS — E039 Hypothyroidism, unspecified: Secondary | ICD-10-CM | POA: Diagnosis not present

## 2024-02-03 DIAGNOSIS — E7800 Pure hypercholesterolemia, unspecified: Secondary | ICD-10-CM | POA: Diagnosis not present

## 2024-02-07 DIAGNOSIS — I10 Essential (primary) hypertension: Secondary | ICD-10-CM | POA: Diagnosis not present

## 2024-02-07 DIAGNOSIS — N1832 Chronic kidney disease, stage 3b: Secondary | ICD-10-CM | POA: Diagnosis not present

## 2024-02-07 DIAGNOSIS — E1122 Type 2 diabetes mellitus with diabetic chronic kidney disease: Secondary | ICD-10-CM | POA: Diagnosis not present

## 2024-02-07 DIAGNOSIS — E1142 Type 2 diabetes mellitus with diabetic polyneuropathy: Secondary | ICD-10-CM | POA: Diagnosis not present

## 2024-02-10 DIAGNOSIS — E7800 Pure hypercholesterolemia, unspecified: Secondary | ICD-10-CM | POA: Diagnosis not present

## 2024-02-10 DIAGNOSIS — E1122 Type 2 diabetes mellitus with diabetic chronic kidney disease: Secondary | ICD-10-CM | POA: Diagnosis not present

## 2024-02-10 DIAGNOSIS — I700 Atherosclerosis of aorta: Secondary | ICD-10-CM | POA: Diagnosis not present

## 2024-02-10 DIAGNOSIS — R600 Localized edema: Secondary | ICD-10-CM | POA: Diagnosis not present

## 2024-02-10 DIAGNOSIS — E039 Hypothyroidism, unspecified: Secondary | ICD-10-CM | POA: Diagnosis not present

## 2024-02-10 DIAGNOSIS — N1832 Chronic kidney disease, stage 3b: Secondary | ICD-10-CM | POA: Diagnosis not present

## 2024-02-10 DIAGNOSIS — F3341 Major depressive disorder, recurrent, in partial remission: Secondary | ICD-10-CM | POA: Diagnosis not present

## 2024-02-25 DIAGNOSIS — E119 Type 2 diabetes mellitus without complications: Secondary | ICD-10-CM | POA: Diagnosis not present

## 2024-02-29 DIAGNOSIS — N1832 Chronic kidney disease, stage 3b: Secondary | ICD-10-CM | POA: Diagnosis not present

## 2024-02-29 DIAGNOSIS — E1142 Type 2 diabetes mellitus with diabetic polyneuropathy: Secondary | ICD-10-CM | POA: Diagnosis not present

## 2024-02-29 DIAGNOSIS — E1165 Type 2 diabetes mellitus with hyperglycemia: Secondary | ICD-10-CM | POA: Diagnosis not present

## 2024-02-29 DIAGNOSIS — I10 Essential (primary) hypertension: Secondary | ICD-10-CM | POA: Diagnosis not present

## 2024-02-29 DIAGNOSIS — E1122 Type 2 diabetes mellitus with diabetic chronic kidney disease: Secondary | ICD-10-CM | POA: Diagnosis not present

## 2024-03-08 DIAGNOSIS — I10 Essential (primary) hypertension: Secondary | ICD-10-CM | POA: Diagnosis not present

## 2024-03-08 DIAGNOSIS — E1142 Type 2 diabetes mellitus with diabetic polyneuropathy: Secondary | ICD-10-CM | POA: Diagnosis not present

## 2024-03-08 DIAGNOSIS — E1122 Type 2 diabetes mellitus with diabetic chronic kidney disease: Secondary | ICD-10-CM | POA: Diagnosis not present

## 2024-03-08 DIAGNOSIS — N1832 Chronic kidney disease, stage 3b: Secondary | ICD-10-CM | POA: Diagnosis not present

## 2024-03-26 ENCOUNTER — Other Ambulatory Visit (HOSPITAL_BASED_OUTPATIENT_CLINIC_OR_DEPARTMENT_OTHER): Payer: Self-pay | Admitting: Internal Medicine

## 2024-03-26 ENCOUNTER — Other Ambulatory Visit: Payer: Self-pay | Admitting: Internal Medicine

## 2024-03-26 DIAGNOSIS — Z1231 Encounter for screening mammogram for malignant neoplasm of breast: Secondary | ICD-10-CM

## 2024-03-26 DIAGNOSIS — M8580 Other specified disorders of bone density and structure, unspecified site: Secondary | ICD-10-CM

## 2024-03-26 DIAGNOSIS — Z1331 Encounter for screening for depression: Secondary | ICD-10-CM | POA: Diagnosis not present

## 2024-03-26 DIAGNOSIS — Z23 Encounter for immunization: Secondary | ICD-10-CM | POA: Diagnosis not present

## 2024-03-26 DIAGNOSIS — Z0000 Encounter for general adult medical examination without abnormal findings: Secondary | ICD-10-CM | POA: Diagnosis not present

## 2024-03-26 DIAGNOSIS — I700 Atherosclerosis of aorta: Secondary | ICD-10-CM | POA: Diagnosis not present

## 2024-03-26 DIAGNOSIS — E039 Hypothyroidism, unspecified: Secondary | ICD-10-CM | POA: Diagnosis not present

## 2024-03-26 DIAGNOSIS — N1832 Chronic kidney disease, stage 3b: Secondary | ICD-10-CM | POA: Diagnosis not present

## 2024-03-26 DIAGNOSIS — F3341 Major depressive disorder, recurrent, in partial remission: Secondary | ICD-10-CM | POA: Diagnosis not present

## 2024-03-26 DIAGNOSIS — Z794 Long term (current) use of insulin: Secondary | ICD-10-CM | POA: Diagnosis not present

## 2024-03-26 DIAGNOSIS — E1142 Type 2 diabetes mellitus with diabetic polyneuropathy: Secondary | ICD-10-CM | POA: Diagnosis not present

## 2024-03-26 DIAGNOSIS — E7800 Pure hypercholesterolemia, unspecified: Secondary | ICD-10-CM | POA: Diagnosis not present

## 2024-03-26 DIAGNOSIS — E1122 Type 2 diabetes mellitus with diabetic chronic kidney disease: Secondary | ICD-10-CM | POA: Diagnosis not present

## 2024-03-30 DIAGNOSIS — I10 Essential (primary) hypertension: Secondary | ICD-10-CM | POA: Diagnosis not present

## 2024-03-30 DIAGNOSIS — E1165 Type 2 diabetes mellitus with hyperglycemia: Secondary | ICD-10-CM | POA: Diagnosis not present

## 2024-03-30 DIAGNOSIS — N1832 Chronic kidney disease, stage 3b: Secondary | ICD-10-CM | POA: Diagnosis not present

## 2024-03-30 DIAGNOSIS — E1122 Type 2 diabetes mellitus with diabetic chronic kidney disease: Secondary | ICD-10-CM | POA: Diagnosis not present

## 2024-03-30 DIAGNOSIS — E1142 Type 2 diabetes mellitus with diabetic polyneuropathy: Secondary | ICD-10-CM | POA: Diagnosis not present

## 2024-04-13 DIAGNOSIS — L84 Corns and callosities: Secondary | ICD-10-CM | POA: Diagnosis not present

## 2024-04-13 DIAGNOSIS — B351 Tinea unguium: Secondary | ICD-10-CM | POA: Diagnosis not present

## 2024-04-13 DIAGNOSIS — E1151 Type 2 diabetes mellitus with diabetic peripheral angiopathy without gangrene: Secondary | ICD-10-CM | POA: Diagnosis not present

## 2024-08-04 ENCOUNTER — Telehealth: Payer: Self-pay

## 2024-08-04 DIAGNOSIS — Z794 Long term (current) use of insulin: Secondary | ICD-10-CM

## 2024-08-05 ENCOUNTER — Telehealth: Payer: Self-pay | Admitting: *Deleted

## 2024-08-05 NOTE — Progress Notes (Unsigned)
 Complex Care Management Note Care Guide Note  08/05/2024 Name: Diane Knight MRN: [id number] DOB: Sep 22, 1940   Complex Care Management Outreach Attempts: An unsuccessful telephone outreach was attempted today to offer the patient information about available complex care management services.  Follow Up Plan:  Additional outreach attempts will be made to offer the patient complex care management information and services.   Encounter Outcome:  No Answer  Harlene Satterfield  [facility]  [facility], [facility] Guide  Direct Dial: [phone]  Fax [phone]
